# Patient Record
Sex: Male | Born: 1935
Health system: Southern US, Community
[De-identification: ages and names within clinical notes are randomized; demographics above are authoritative.]

## PROBLEM LIST (undated history)

## (undated) DIAGNOSIS — E119 Type 2 diabetes mellitus without complications: Secondary | ICD-10-CM

## (undated) DIAGNOSIS — G4733 Obstructive sleep apnea (adult) (pediatric): Secondary | ICD-10-CM

## (undated) DIAGNOSIS — R5383 Other fatigue: Secondary | ICD-10-CM

## (undated) DIAGNOSIS — R55 Syncope and collapse: Secondary | ICD-10-CM

## (undated) DIAGNOSIS — I10 Essential (primary) hypertension: Secondary | ICD-10-CM

## (undated) DIAGNOSIS — E785 Hyperlipidemia, unspecified: Secondary | ICD-10-CM

## (undated) DIAGNOSIS — I4891 Unspecified atrial fibrillation: Secondary | ICD-10-CM

## (undated) DIAGNOSIS — S5290XA Unspecified fracture of unspecified forearm, initial encounter for closed fracture: Secondary | ICD-10-CM

## (undated) DIAGNOSIS — S52209A Unspecified fracture of shaft of unspecified ulna, initial encounter for closed fracture: Secondary | ICD-10-CM

## (undated) DIAGNOSIS — I471 Supraventricular tachycardia: Secondary | ICD-10-CM

## (undated) HISTORY — DX: Unspecified fracture of shaft of unspecified ulna, initial encounter for closed fracture: S52.209A

## (undated) HISTORY — DX: Essential (primary) hypertension: I10

## (undated) HISTORY — DX: Syncope and collapse: R55

## (undated) HISTORY — DX: Unspecified fracture of unspecified forearm, initial encounter for closed fracture: S52.90XA

---

## 1993-11-09 DIAGNOSIS — S52209A Unspecified fracture of shaft of unspecified ulna, initial encounter for closed fracture: Secondary | ICD-10-CM

## 1993-11-09 HISTORY — DX: Unspecified fracture of shaft of unspecified ulna, initial encounter for closed fracture: S52.209A

## 2004-11-09 DIAGNOSIS — S5290XA Unspecified fracture of unspecified forearm, initial encounter for closed fracture: Secondary | ICD-10-CM

## 2004-11-09 HISTORY — DX: Unspecified fracture of unspecified forearm, initial encounter for closed fracture: S52.90XA

## 2009-12-04 ENCOUNTER — Ambulatory Visit: Payer: Self-pay | Admitting: Cardiology

## 2009-12-04 ENCOUNTER — Ambulatory Visit: Payer: Self-pay | Admitting: Ophthalmology

## 2009-12-18 ENCOUNTER — Ambulatory Visit: Payer: Self-pay | Admitting: Ophthalmology

## 2010-01-22 LAB — HM COLONOSCOPY: HM Colonoscopy: NORMAL

## 2010-05-21 ENCOUNTER — Ambulatory Visit: Payer: Self-pay | Admitting: Ophthalmology

## 2010-11-09 DIAGNOSIS — I471 Supraventricular tachycardia, unspecified: Secondary | ICD-10-CM

## 2010-11-09 HISTORY — DX: Supraventricular tachycardia: I47.1

## 2010-11-09 HISTORY — DX: Supraventricular tachycardia, unspecified: I47.10

## 2011-02-10 ENCOUNTER — Ambulatory Visit: Payer: Self-pay | Admitting: Internal Medicine

## 2011-06-10 HISTORY — PX: HERNIA REPAIR: SHX51

## 2011-06-29 ENCOUNTER — Ambulatory Visit: Payer: Self-pay | Admitting: General Surgery

## 2011-06-29 ENCOUNTER — Emergency Department: Payer: Self-pay | Admitting: Emergency Medicine

## 2011-06-30 ENCOUNTER — Encounter: Payer: Self-pay | Admitting: Internal Medicine

## 2011-06-30 ENCOUNTER — Telehealth: Payer: Self-pay | Admitting: Internal Medicine

## 2011-06-30 ENCOUNTER — Ambulatory Visit (INDEPENDENT_AMBULATORY_CARE_PROVIDER_SITE_OTHER): Payer: Medicare Other | Admitting: Internal Medicine

## 2011-06-30 VITALS — BP 138/68 | HR 82 | Temp 97.4°F | Resp 16 | Ht 66.5 in | Wt 141.5 lb

## 2011-06-30 DIAGNOSIS — I495 Sick sinus syndrome: Secondary | ICD-10-CM

## 2011-06-30 DIAGNOSIS — K4090 Unilateral inguinal hernia, without obstruction or gangrene, not specified as recurrent: Secondary | ICD-10-CM | POA: Insufficient documentation

## 2011-06-30 DIAGNOSIS — R000 Tachycardia, unspecified: Secondary | ICD-10-CM

## 2011-06-30 DIAGNOSIS — I499 Cardiac arrhythmia, unspecified: Secondary | ICD-10-CM

## 2011-06-30 LAB — BASIC METABOLIC PANEL
BUN: 18 mg/dL (ref 6–23)
CO2: 28 mEq/L (ref 19–32)
Chloride: 103 mEq/L (ref 96–112)
Creatinine, Ser: 0.7 mg/dL (ref 0.4–1.5)
Potassium: 4.1 mEq/L (ref 3.5–5.1)

## 2011-06-30 LAB — MAGNESIUM: Magnesium: 2.5 mg/dL (ref 1.5–2.5)

## 2011-06-30 LAB — CBC WITH DIFFERENTIAL/PLATELET
Basophils Relative: 0.7 % (ref 0.0–3.0)
Eosinophils Absolute: 0.3 10*3/uL (ref 0.0–0.7)
Eosinophils Relative: 6 % — ABNORMAL HIGH (ref 0.0–5.0)
HCT: 43 % (ref 39.0–52.0)
Lymphs Abs: 1.7 10*3/uL (ref 0.7–4.0)
MCHC: 33.8 g/dL (ref 30.0–36.0)
MCV: 93.7 fl (ref 78.0–100.0)
Monocytes Absolute: 0.4 10*3/uL (ref 0.1–1.0)
Neutrophils Relative %: 44.6 % (ref 43.0–77.0)
Platelets: 207 10*3/uL (ref 150.0–400.0)
RBC: 4.59 Mil/uL (ref 4.22–5.81)

## 2011-06-30 LAB — TSH: TSH: 0.63 u[IU]/mL (ref 0.35–5.50)

## 2011-06-30 NOTE — Progress Notes (Signed)
  Subjective:    Patient ID: Hector Lucero, male    DOB: 1928-04-26, 77 y.o.   MRN: [id number]  HPI Hector Lucero is here for medical clearance following an ER visit for tachycardia.  He presented to [facility]  on August 20 for a preop exam for anticipated inguinal hernia repair which is cheduled for Monday August 27 [facility]  He was noted to have a rapid heart rate and was sent to ER for evaluation of asymptomatic SVT vs atrial fib,  Hr 147.  During  ER visit he was given IV metoprolol and an rx for Toprol, which he has been taking,  But his low potassium was not addressed.  He feels fine, denies any history of palpitations, chest pain , dyspnea, or weakness.  No recent GI illnesses, travel or physical exertion resulting in severe dehydration.  Does drink several cups of cofee daily for caffeine intake assessment.    Review of Systems  Constitutional: Negative for fever, chills and fatigue.  HENT: Negative for rhinorrhea, drooling, neck pain and sinus pressure.   Eyes: Positive for redness. Negative for pain.  Respiratory: Negative for cough, choking, chest tightness and shortness of breath.   Cardiovascular: Negative for chest pain, palpitations and leg swelling.  Gastrointestinal: Negative for abdominal pain, constipation and blood in stool.  Genitourinary: Positive for scrotal swelling.  Musculoskeletal: Negative for back pain and arthralgias.  Skin: Positive for wound.  Neurological: Negative for dizziness, seizures, syncope and numbness.  Hematological: Negative for adenopathy. Does not bruise/bleed easily.  Psychiatric/Behavioral: Negative for suicidal ideas, hallucinations, sleep disturbance and agitation.       Objective:   Physical Exam  Constitutional: He is oriented to person, place, and time. He appears well-developed and well-nourished. No distress.  HENT:  Head: Atraumatic.  Mouth/Throat: Oropharynx is clear and moist.  Eyes: Pupils are equal, round, and reactive to light.  Right eye exhibits no discharge. Left eye exhibits no discharge. No scleral icterus.  Neck: Normal range of motion. Neck supple. No JVD present. No thyromegaly present.  Cardiovascular: Normal rate, regular rhythm, normal heart sounds and intact distal pulses.  Exam reveals no gallop and no friction rub.   No murmur heard. Pulmonary/Chest: Effort normal and breath sounds normal. He has no rales. He exhibits no tenderness.  Abdominal: Soft. Bowel sounds are normal. There is no tenderness.  Musculoskeletal: Normal range of motion. He exhibits no edema.  Lymphadenopathy:    He has no cervical adenopathy.  Neurological: He is alert and oriented to person, place, and time.  Skin: Skin is warm and dry. No erythema.  Psychiatric: He has a normal mood and affect. His behavior is normal.          Assessment & Plan:  1) Arrhythmia:  Unclear whether his episode was SVT or PAF.  His repeat EKG today was NSR, as was his panel of electrolytes, including Mg, his  CBC  and TSH.  Episode is attributed to mild dehydration and hypokalemia, now both resolved. 2) Preoperative evaluation:  Patient is medically cleared for inguinal hernia repair.

## 2011-06-30 NOTE — Patient Instructions (Signed)
Your potassium was low, which caused your heart to race.  Make sure you use electrolyte replacement solutions like Gatorade to replace the fluid you lose when you sweat a lot. Arrhythmia Categories Your heart is a muscle that works to pump blood through your body by regular contractions. The beating of your heart is controlled by a system of special pacemaker cells. These cells control the electrical activity of the heart. When the system controlling this regular beating is disturbed, a heart rhythm abnormality (arrhythmia) results. WHEN YOUR HEART SKIPS A BEAT One of the most common and least serious heart arrhythmias is called an ectopic or premature atrial heartbeat (PAC). This may be noticed as a small change in your regular pulse. A PAC originates from the top part (atrium) of the heart. Within the right atrium, the SA node is the area that normally controls the regularity of the heart. PACs occur in heart tissue outside of the SA node region. You may feel this as a skipped beat or heart flutter, especially if several occur in succession or occur frequently.   Another arrhythmia is ventricular premature complex (VCP or PVC). These extra beats start out in the bottom, more muscular chambers of the heart. In most cases a PVC is harmless. If there are underlying causes that are making the heart irritable such as an overactive thyroid or a prior heart attack PVCs may be of more concern. In a few cases, medications to control the heart rhythm may be prescribed. Things to try at home:  Cut down or avoid alcohol, tobacco and caffeine.     Get enough sleep.     Reduce stress.   Exercise more.     WHEN THE HEART BEATS TOO FAST Atrial tachycardia is a fast heart rate, which starts out in the atrium. It may last from minutes to much longer. Your heart may beat 140 to 240 times per minute instead of the normal 60 to 100.  Symptoms include a worried feeling (anxiety) and a sense that your heart is beating  fast and hard.   You may be able to stop the fast rate by holding your breath or bearing down as if you were going to have a bowel movement.   This type of fast rate is usually not dangerous.  Atrial fibrillation and atrial flutter are other fast rhythms that start in the atria. Both conditions keep the atria from filling with enough blood so the heart does not work well.  Symptoms include feeling light-headed or faint.   These fast rates may be the result of heart damage or disease. Too much thyroid hormone may play a role.   There may be no clear cause or it may be from heart disease or damage.   Medication or a special electrical treatment (cardioversion) may be needed to get the heart beating normally.  Ventricular tachycardia is a fast heart rate that starts in the lower muscular chambers (ventricles) This is a serious disorder that requires treatment as soon as possible. You need someone else to get and use a small defibrillator.  Symptoms include collapse, chest pain, or being short of breath.   Treatment may include medication, procedures to improve blood flow to the heart, or an implantable cardiac defibrillator (ICD).  DIAGNOSIS  A cardiogram (EKG or ECG) will be done to see the arrhythmia, as well as lab tests to check the underlying cause.   If the extra beats or fast rate come and go, you may wear a  Holter monitor that records your heart rate for a longer period of time.  HOME CARE INSTRUCTIONS  If your caregiver has given you a follow-up appointment, it is very important to keep that appointment. Not keeping the appointment could result in a heart attack, permanent injury, pain, and disability. If there is any problem keeping the appointment, you must call back to this facility for assistance.  SEE YOUR CAREGIVER IF YOU EXPERIENCE:  Irregular or fast heart beats (palpitations).   Skipped beats.     Lightheadedness.    Chest discomfort.   Shortness of breath.      More frequent episodes, if you are already being treated.     SEEK IMMEDIATE MEDICAL CARE IF:  You have severe chest pain, especially if the pain is crushing or pressure-like and spreads to the arms, back, neck, or jaw, or if you have sweating, feeling sick to your stomach (nausea), or shortness of breath. THIS IS AN EMERGENCY. Do not wait to see if the pain will go away. Get medical help at once. Call 911 or 0 (operator). DO NOT drive yourself to the hospital.   You feel dizzy or faint.   You have episodes of previously documented atrial tachycardia that do not resolve with the techniques your caregiver has taught you.   Irregular or rapid heartbeats begin to occur more often than in the past, especially if they are associated with more pronounced symptoms or of longer duration.  Document Released: 10/26/2005 Document Re-Released: 01/20/2010 [facility] Patient Information 2011 [place], [place].

## 2011-07-01 NOTE — Telephone Encounter (Signed)
Dr. Rutherford Nail office called and wanted to know if you cleared patient for surgery at his office visit yesterday.  Surgery is scheduled for 07/06/11 and he was going to preop today.  Please fax clearance letter to [facility] at [phone].

## 2011-07-06 ENCOUNTER — Ambulatory Visit: Payer: Self-pay | Admitting: General Surgery

## 2011-07-17 ENCOUNTER — Telehealth: Payer: Self-pay | Admitting: Internal Medicine

## 2011-07-17 NOTE — Telephone Encounter (Signed)
Patient is still having issues with rapid heart rate, please contact

## 2011-07-21 NOTE — Telephone Encounter (Signed)
I have left a message for patient to return me call

## 2011-07-23 NOTE — Telephone Encounter (Signed)
Patient has an 07/30/11 OV and is on the waiting list for an earlier appt

## 2011-07-30 ENCOUNTER — Encounter: Payer: Self-pay | Admitting: Internal Medicine

## 2011-07-30 ENCOUNTER — Ambulatory Visit (INDEPENDENT_AMBULATORY_CARE_PROVIDER_SITE_OTHER): Payer: Medicare Other | Admitting: Internal Medicine

## 2011-07-30 VITALS — BP 162/93 | HR 83 | Temp 98.1°F | Resp 16 | Wt 141.5 lb

## 2011-07-30 DIAGNOSIS — I10 Essential (primary) hypertension: Secondary | ICD-10-CM

## 2011-07-30 DIAGNOSIS — R000 Tachycardia, unspecified: Secondary | ICD-10-CM

## 2011-07-30 MED ORDER — METOPROLOL SUCCINATE ER 25 MG PO TB24: ORAL_TABLET | ORAL | Status: DC

## 2011-07-30 NOTE — Assessment & Plan Note (Addendum)
Diagnosed in mid August during preop eval.  EKg in office suggests RBBB.  He has no history of tobacco abuse or pulmonary disease, no signs of sleep apnea to suggest nocturnal hypoxia as cause.  Thyroid and electrolytes including magnesium were normal last month. He is concerned about recurrent palpitations in this setting.  Will increase the Toprol to twice daily and refer to Dossie Arbour for cardiac evaluation

## 2011-07-30 NOTE — Patient Instructions (Addendum)
We will increase the metoprolol to two times daily for your palpitations and refer your Dr. Mariah Milling at [facility] for evaluation.

## 2011-07-30 NOTE — Progress Notes (Signed)
Subjective:    Patient ID: Hector Lucero, male    DOB: 1928/03/26, 77 y.o.   MRN: [id number]  HPI 77 yo white male returns for hospital followup following his hernia surgery.  During his preoperative evaluation he was noted to be tachycardic and was sent to ER for evaluation.  Rate was in the 160's.  He was given IV metoprolol , sent home with  rx for oral metoprolol succinate , 25 mg daily and referred to me.  At last visit his rate was controlled and I cleared him for surgery after checking thyroid and electrolytes. In house EKG noted sinus rhythm with RBBB.  He remains on metoprolol.  Surgery was done, an uneventful repair of indirect right inguinal hernia, on Aug 27 by Adela Glimpse.  He returns today with chief cc of recurrent brief asymptomatic runs of tachycardia, rates around 110.  Episodes typically occur in the afternoon and evening. 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  He denies chest pain, dyspnea, orthopnea, weight loss or gain, and edema.  Drinks 4 copies of weak coffe daily,  No soft drinks, rare alcohol .  No tobacco.    Past Medical History  Diagnosis Date  . Radial fracture 2006    right, screws and plates  . Ulnar fracture 1995    fall off of ladder   No medication comments found. Current Outpatient Prescriptions on File Prior to Visit  Medication Sig Dispense Refill  . aspirin 81 MG tablet Take 81 mg by mouth daily.        . calcium carbonate (OS-CAL) 600 MG TABS Take 600 mg by mouth daily.        . fish oil-omega-3 fatty acids 1000 MG capsule Take 1,000 mg by mouth daily.        . Multiple Vitamins-Minerals (CENTRUM SILVER PO) Take 1 tablet by mouth daily.           Review of Systems  Constitutional: Negative for fever, chills, diaphoresis, activity change, appetite change, fatigue and unexpected weight change.  HENT: Negative for hearing loss, ear pain, nosebleeds, congestion,  sore throat, facial swelling, rhinorrhea, sneezing, drooling, mouth sores, trouble swallowing, neck pain, neck stiffness, dental problem, voice change, postnasal drip, sinus pressure, tinnitus and ear discharge.   Eyes: Negative for photophobia, pain, discharge, redness, itching and visual disturbance.  Respiratory: Negative for apnea, cough, choking, chest tightness, shortness of breath, wheezing and stridor.   Cardiovascular: Positive for palpitations. Negative for chest pain and leg swelling.  Gastrointestinal: Negative for nausea, vomiting, abdominal pain, diarrhea, constipation, blood in stool, abdominal distention, anal bleeding and rectal pain.  Genitourinary: Negative for dysuria, urgency, frequency, hematuria, flank pain, decreased urine volume, scrotal swelling, difficulty urinating and testicular pain.  Musculoskeletal: Negative for myalgias, back pain, joint swelling, arthralgias and gait problem.  Skin: Negative for color change, rash and wound.  Neurological: Negative for dizziness, tremors, seizures, syncope, speech difficulty, weakness, light-headedness, numbness and headaches.  Psychiatric/Behavioral: Negative for suicidal ideas, hallucinations, behavioral problems, confusion, sleep disturbance, dysphoric mood, decreased concentration and agitation. The patient is not nervous/anxious.    BP 162/93  Pulse 83  Temp(Src) 98.1 F (36.7 C) (Oral)  Resp 16  Wt 141 lb 8 oz (64.184 kg)  SpO2 98%     Objective:   Physical Exam  Constitutional: He is oriented to person, place, and time.  HENT:  Head: Normocephalic and atraumatic.  Mouth/Throat: Oropharynx  is clear and moist.  Eyes: Conjunctivae and EOM are normal.  Neck: Normal range of motion. Neck supple. No JVD present. No thyromegaly present.  Cardiovascular: Normal rate, regular rhythm and normal heart sounds.   Pulmonary/Chest: Effort normal and breath sounds normal. He has no wheezes. He has no rales.  Abdominal: Soft.  Bowel sounds are normal. He exhibits no mass. There is no tenderness. There is no rebound.  Musculoskeletal: Normal range of motion. He exhibits no edema.  Neurological: He is alert and oriented to person, place, and time.  Skin: Skin is warm and dry.  Psychiatric: He has a normal mood and affect.          Assessment & Plan:

## 2011-08-13 ENCOUNTER — Ambulatory Visit (INDEPENDENT_AMBULATORY_CARE_PROVIDER_SITE_OTHER): Payer: Medicare Other | Admitting: Cardiovascular Disease

## 2011-08-13 ENCOUNTER — Encounter: Payer: Self-pay | Admitting: Cardiovascular Disease

## 2011-08-13 VITALS — BP 110/62 | HR 77 | Ht 67.0 in | Wt 140.0 lb

## 2011-08-13 DIAGNOSIS — R000 Tachycardia, unspecified: Secondary | ICD-10-CM

## 2011-08-13 MED ORDER — DILTIAZEM HCL 30 MG PO TABS: 30.0000 mg | ORAL_TABLET | Freq: Three times a day (TID) | ORAL | Status: DC | PRN

## 2011-08-13 NOTE — Progress Notes (Signed)
Patient ID: Hector Lucero, male    DOB: [birth year], 77 y.o.   MRN: [id number]  HPI Comments: 77 yo white male, patient of Dr. Darrick Huntsman,  with a hx of  Hernia, s/p surgical repair,  noted to be tachycardic during his preoperative evaluation,   sent to ER, noted to have a narrow complex tachycardia with rates in the 160's, given IV metoprolol with acute conversion of his rate to the 70s , sent home with  metoprolol succinate 25 mg daily. The metoprolol was increased to b.i.d. He recently by Dr. Darrick Huntsman.    He reports that he had an uneventful repair of indirect right inguinal hernia, on Aug 27 by Adela Glimpse.   He did not appreciate the tachycardia appeared he had no shortness of breath, no chest discomfort. He was unaware that his heart rate was elevated. He denies any significant symptoms at home. He is relatively active with no complaints. He does drink significant coffee on a daily basis but has been trying to cut back.  Recent note by Dr. Darrick Huntsman mentioned his heart rate continued to be elevated with rates to 110. His metoprolol succinate was increased to b.i.d..    No tobacco.   EKG today shows normal sinus rhythm with rate 77 beats per minute with no significant ST or T wave changes   Outpatient Encounter Prescriptions as of 08/13/2011  Medication Sig Dispense Refill  . aspirin 81 MG tablet Take 81 mg by mouth daily.        . calcium carbonate (OS-CAL) 600 MG TABS Take 600 mg by mouth daily.        . fish oil-omega-3 fatty acids 1000 MG capsule Take 1,000 mg by mouth daily.        . metoprolol succinate (TOPROL-XL) 25 MG 24 hr tablet Take one tablet two times daily for tachycardia.  60 tablet  5  . Multiple Vitamins-Minerals (CENTRUM SILVER PO) Take 1 tablet by mouth daily.         Review of Systems  Constitutional: Negative.   HENT: Negative.   Eyes: Negative.   Respiratory: Negative.   Cardiovascular: Negative.   Gastrointestinal: Negative.   Musculoskeletal: Negative.   Skin:  Negative.   Neurological: Negative.   Hematological: Negative.   Psychiatric/Behavioral: Negative.   All other systems reviewed and are negative.    BP 110/62  Pulse 77  Ht 5\' 7"  (1.702 m)  Wt 140 lb (63.504 kg)  BMI 21.93 kg/m2  Physical Exam  Nursing note and vitals reviewed. Constitutional: He is oriented to person, place, and time. He appears well-developed and well-nourished.  HENT:  Head: Normocephalic.  Nose: Nose normal.  Mouth/Throat: Oropharynx is clear and moist.  Eyes: Conjunctivae are normal. Pupils are equal, round, and reactive to light.  Neck: Normal range of motion. Neck supple. No JVD present.  Cardiovascular: Normal rate, regular rhythm, S1 normal, S2 normal, normal heart sounds and intact distal pulses.  Exam reveals no gallop and no friction rub.   No murmur heard. Pulmonary/Chest: Effort normal and breath sounds normal. No respiratory distress. He has no wheezes. He has no rales. He exhibits no tenderness.  Abdominal: Soft. Bowel sounds are normal. He exhibits no distension. There is no tenderness.  Musculoskeletal: Normal range of motion. He exhibits no edema and no tenderness.  Lymphadenopathy:    He has no cervical adenopathy.  Neurological: He is alert and oriented to person, place, and time. Coordination normal.  Skin: Skin is warm and dry.  No rash noted. No erythema.  Psychiatric: He has a normal mood and affect. His behavior is normal. Judgment and thought content normal.           Assessment and Plan

## 2011-08-13 NOTE — Patient Instructions (Signed)
You are doing well. Please take your pulse using a monitor Take the diltiazem as needed for tachycardia Please call us if you have new issues that need to be addressed before your next appt.  We will call you for a follow up Appt. In 1     months

## 2011-08-13 NOTE — Assessment & Plan Note (Addendum)
We were able to obtain his EKGs from the hospital. This showed a heart rate of 147 beats per minute, regular narrow complex tachycardia. Rate improved to 139 beats per minute shortly after, again with narrow complex regular rhythm. Unable to distinguish whether this is an atrial tachycardia or SVT. It did seem to respond to IV metoprolol in the emergency room on August 20.  He seems to be tolerating metoprolol succinate 25 mg b.i.d. Very well with no complaints. As he did not appreciate the tachycardia on August 20, it is uncertain if he has had this arrhythmia off and on for prolonged period of time or if this was a single event.  We have suggested he wear a Holter monitor for 48 hours to exclude arrhythmia. I've asked him to consider buying a pulse oximeter through [employer] or a medical supply store so that he can easily track his heart rate at home. This could potentially save [place] from having him wear an event monitor for 30 days.  Clinical exam is essentially benign and no further testing has been ordered. If he does have further symptoms such as shortness of breath or chest discomfort, we would consider an echo and stress test. He seemed to handle a heart rate of 160 beats per minute with ease suggesting there is no rush to perform additional testing at this time.  I did provide him with a prescription for diltiazem 30 mg to take p.r.n. If he does notice a tachycardia at home. I have shown him the Valsalva and carotid sinus maneuvers. I suggested to him that if he notices a tachycardia at home that is number with extra diltiazem or maneuvers, that he go to the emergency room. If this happens, he may require an ablation.

## 2011-08-20 ENCOUNTER — Encounter: Payer: Medicare Other | Admitting: *Deleted

## 2011-08-20 DIAGNOSIS — R000 Tachycardia, unspecified: Secondary | ICD-10-CM

## 2011-09-02 ENCOUNTER — Telehealth: Payer: Self-pay | Admitting: Cardiovascular Disease

## 2011-09-02 NOTE — Telephone Encounter (Signed)
Pt calling about holter monitor results. Also needs a script mailed to pt for diltiazem 30mg . Needs it for mail in pharamacy

## 2011-09-02 NOTE — Telephone Encounter (Signed)
Will discuss results after TG reviewed with his last note.

## 2011-09-04 ENCOUNTER — Telehealth: Payer: Self-pay

## 2011-09-04 MED ORDER — DILTIAZEM HCL 30 MG PO TABS: 30.0000 mg | ORAL_TABLET | Freq: Three times a day (TID) | ORAL | Status: DC | PRN

## 2011-09-04 NOTE — Telephone Encounter (Signed)
The patient would like to pick up Rx for diltiazem 30 mg for 90 day supply.

## 2011-09-11 ENCOUNTER — Telehealth: Payer: Self-pay

## 2011-09-11 ENCOUNTER — Other Ambulatory Visit: Payer: Self-pay | Admitting: *Deleted

## 2011-09-11 MED ORDER — DILTIAZEM HCL 30 MG PO TABS: 30.0000 mg | ORAL_TABLET | Freq: Three times a day (TID) | ORAL | Status: DC | PRN

## 2011-09-11 NOTE — Telephone Encounter (Signed)
Refill sent.

## 2011-09-14 ENCOUNTER — Ambulatory Visit (INDEPENDENT_AMBULATORY_CARE_PROVIDER_SITE_OTHER): Payer: Medicare Other | Admitting: Cardiovascular Disease

## 2011-09-14 ENCOUNTER — Encounter: Payer: Self-pay | Admitting: Cardiovascular Disease

## 2011-09-14 VITALS — BP 120/62 | HR 72 | Ht 68.0 in | Wt 140.0 lb

## 2011-09-14 DIAGNOSIS — R000 Tachycardia, unspecified: Secondary | ICD-10-CM

## 2011-09-14 NOTE — Assessment & Plan Note (Signed)
He denies any further symptoms. We have asked him to monitor his heart rate periodically. As he is doing well with good energy and no complaints, we have suggested he stay on metoprolol succinate and diltiazem t.i.d..

## 2011-09-14 NOTE — Patient Instructions (Signed)
You are doing well. No medication changes were made.  Please call us if you have new issues that need to be addressed before your next appt.  The office will contact you for a follow up Appt. In 12 months  

## 2011-09-14 NOTE — Progress Notes (Signed)
Patient ID: Hector Lucero, male    DOB: 05-23-1928, 77 y.o.   MRN: [id number]  HPI Comments: 77 yo white male, patient of Dr. Darrick Huntsman,  with a hx of  Hernia, s/p surgical repair,  noted to be tachycardic during his preoperative evaluation,   sent to ER, noted to have a narrow complex tachycardia with rates in the 160's, given IV metoprolol with acute conversion of his rate to the 70s , sent home with  metoprolol succinate 25 mg daily, We have increased to metoprolol b.i.d. By Dr. Darrick Huntsman. He reports that he had an uneventful repair of indirect right inguinal hernia, on Aug 27 by Adela Glimpse.   Holter monitor x48 hours at that time showed 27 episodes of right true SVT or atrial tachycardia, the longest episode lasting 36 beats. He was asymptomatic He denies  shortness of breath, no chest discomfort. He was unaware that his heart rate was elevated. He denies any significant symptoms at home. He is relatively active with no complaints. He does drink significant coffee on a daily basis but has been trying to cut back.  Today he reports that he only takes metoprolol succinate once a day and has been taking diltiazem 30 mg t.i.d.. We had previously prescribed the diltiazem p.r.n. For breakthrough palpitations. He is uncertain when the medication changes were made. Overall he feels well with good energy.  EKG today shows normal sinus rhythm with rate 72 beats per minute with no significant ST or T wave changes  Outpatient Encounter Prescriptions as of 09/14/2011  Medication Sig Dispense Refill  . aspirin 81 MG tablet Take 81 mg by mouth daily.        . calcium carbonate (OS-CAL) 600 MG TABS Take 600 mg by mouth daily.        Marland Kitchen diltiazem (CARDIZEM) 30 MG tablet Take 30 mg by mouth 3 (three) times daily.        . fish oil-omega-3 fatty acids 1000 MG capsule Take 1,000 mg by mouth daily.        . metoprolol succinate (TOPROL-XL) 25 MG 24 hr tablet Take 25 mg by mouth daily.        . Multiple Vitamins-Minerals  (CENTRUM SILVER PO) Take 1 tablet by mouth daily.          Review of Systems  Constitutional: Negative.   HENT: Negative.   Eyes: Negative.   Respiratory: Negative.   Cardiovascular: Negative.   Gastrointestinal: Negative.   Musculoskeletal: Negative.   Skin: Negative.   Neurological: Negative.   Hematological: Negative.   Psychiatric/Behavioral: Negative.   All other systems reviewed and are negative.    BP 120/62  Pulse 72  Ht 5\' 8"  (1.727 m)  Wt 140 lb (63.504 kg)  BMI 21.29 kg/m2  Physical Exam  Nursing note and vitals reviewed. Constitutional: He is oriented to person, place, and time. He appears well-developed and well-nourished.  HENT:  Head: Normocephalic.  Nose: Nose normal.  Mouth/Throat: Oropharynx is clear and moist.  Eyes: Conjunctivae are normal. Pupils are equal, round, and reactive to light.  Neck: Normal range of motion. Neck supple. No JVD present.  Cardiovascular: Normal rate, regular rhythm, S1 normal, S2 normal, normal heart sounds and intact distal pulses.  Exam reveals no gallop and no friction rub.   No murmur heard. Pulmonary/Chest: Effort normal and breath sounds normal. No respiratory distress. He has no wheezes. He has no rales. He exhibits no tenderness.  Abdominal: Soft. Bowel sounds are normal. He  exhibits no distension. There is no tenderness.  Musculoskeletal: Normal range of motion. He exhibits no edema and no tenderness.  Lymphadenopathy:    He has no cervical adenopathy.  Neurological: He is alert and oriented to person, place, and time. Coordination normal.  Skin: Skin is warm and dry. No rash noted. No erythema.  Psychiatric: He has a normal mood and affect. His behavior is normal. Judgment and thought content normal.           Assessment and Plan

## 2011-10-07 ENCOUNTER — Telehealth: Payer: Self-pay | Admitting: Internal Medicine

## 2011-10-07 ENCOUNTER — Other Ambulatory Visit: Payer: Self-pay | Admitting: Internal Medicine

## 2011-10-07 MED ORDER — METOPROLOL SUCCINATE ER 25 MG PO TB24: 25.0000 mg | ORAL_TABLET | Freq: Two times a day (BID) | ORAL | Status: DC

## 2011-10-07 MED ORDER — METOPROLOL SUCCINATE ER 25 MG PO TB24: 25.0000 mg | ORAL_TABLET | Freq: Every day | ORAL | Status: DC

## 2011-10-07 NOTE — Telephone Encounter (Signed)
Please note that dose was changed to once daily on the metoprolol,  Qty #90 after reviewing last note from Dr. Mariah Milling

## 2011-12-14 ENCOUNTER — Telehealth: Payer: Self-pay | Admitting: Internal Medicine

## 2011-12-14 DIAGNOSIS — E785 Hyperlipidemia, unspecified: Secondary | ICD-10-CM

## 2011-12-14 DIAGNOSIS — R739 Hyperglycemia, unspecified: Secondary | ICD-10-CM

## 2011-12-14 NOTE — Telephone Encounter (Signed)
[phone] Pt walked in.  He had eye dr  With dr apsior at [facility].  The doc there wanted to know when his last standing sugar labs were done and if you could do this

## 2011-12-14 NOTE — Telephone Encounter (Addendum)
Last one was doe in August and normal. You can have him return for a fasting basic metabolic panel and a hemoglobin A1c. he also needs fasting lipids. I will place the order.

## 2011-12-15 NOTE — Telephone Encounter (Signed)
Patient notified.  Lab appt scheduled.

## 2011-12-17 ENCOUNTER — Other Ambulatory Visit (INDEPENDENT_AMBULATORY_CARE_PROVIDER_SITE_OTHER): Payer: Medicare Other | Admitting: *Deleted

## 2011-12-17 DIAGNOSIS — E785 Hyperlipidemia, unspecified: Secondary | ICD-10-CM

## 2011-12-17 DIAGNOSIS — R7309 Other abnormal glucose: Secondary | ICD-10-CM

## 2011-12-17 DIAGNOSIS — R739 Hyperglycemia, unspecified: Secondary | ICD-10-CM

## 2011-12-17 LAB — COMPREHENSIVE METABOLIC PANEL
ALT: 16 U/L (ref 0–53)
AST: 23 U/L (ref 0–37)
Alkaline Phosphatase: 66 U/L (ref 39–117)
CO2: 28 mEq/L (ref 19–32)
Creatinine, Ser: 0.7 mg/dL (ref 0.4–1.5)
GFR: 116.69 mL/min (ref >60.00)
Sodium: 139 mEq/L (ref 135–145)
Total Bilirubin: 1.1 mg/dL (ref 0.3–1.2)

## 2011-12-17 LAB — LIPID PANEL
HDL: 66.7 mg/dL (ref >39.00)
Triglycerides: 131 mg/dL (ref 0.0–149.0)
VLDL: 26.2 mg/dL (ref 0.0–40.0)

## 2011-12-17 LAB — LDL CHOLESTEROL, DIRECT: Direct LDL: 129.2 mg/dL

## 2011-12-22 ENCOUNTER — Encounter: Payer: Self-pay | Admitting: Internal Medicine

## 2011-12-22 ENCOUNTER — Ambulatory Visit (INDEPENDENT_AMBULATORY_CARE_PROVIDER_SITE_OTHER): Payer: Medicare Other | Admitting: Internal Medicine

## 2011-12-22 DIAGNOSIS — E119 Type 2 diabetes mellitus without complications: Secondary | ICD-10-CM | POA: Diagnosis not present

## 2011-12-22 DIAGNOSIS — R4182 Altered mental status, unspecified: Secondary | ICD-10-CM

## 2011-12-22 DIAGNOSIS — R000 Tachycardia, unspecified: Secondary | ICD-10-CM

## 2011-12-22 DIAGNOSIS — E785 Hyperlipidemia, unspecified: Secondary | ICD-10-CM

## 2011-12-22 NOTE — Progress Notes (Signed)
Subjective:    Patient ID: Hector Lucero, male    DOB: 09-Dec-1927, 78 y.o.   MRN: [id number]  HPI  Hector Lucero is an 78 year old white male with a history of supraventricular tachycardia found incidentally during a preop evaluation for an inguinal hernia in August of 2012 presents today for a scheduled Medicare exam but has agreed to postpone this to 2 current complaint of altered mental status. He is accompanied by his wife who provides a more lengthy history of his recent episodes. According to his wife Hector Lucero had a syncopal episode in November,  which occurred at home during a house party .  He had not eaten in 5 hours,  And had one or two cocktails. He was noted to be ataxic in gait and.the July down. A neighbor noted his behavior and called EMT. His heart rate was monitored and normal per patient he refused transport to the ER. It is not clear whether a capillary blood glucose was checked.the second incident happened several weeks later  during Christmas holidays. After having 2  whiskey drinks he had a fall at his sister-in law's.  there was no loss of consciousness reported  since then his wife has noted on several occasions that he seems a bit slowed in his reactions and mentation without slurred speech these episodes occur around 5 PM. He denies a history of regular alcohol use. 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   his wife thinks he has made a few more errors than usual lately in terms of getting addresses wrong and making calculations.  Past Medical History  Diagnosis Date  . Radial fracture 2006    right, screws and plates  . Ulnar fracture 1995    fall off of ladder  . Hypertension    Current Outpatient Prescriptions on File Prior to Visit  Medication Sig Dispense Refill  . aspirin 81 MG tablet Take 81 mg by mouth daily.        . calcium carbonate (OS-CAL) 600 MG TABS Take 600  mg by mouth daily.        Marland Kitchen diltiazem (CARDIZEM) 30 MG tablet Take 30 mg by mouth 3 (three) times daily.        . fish oil-omega-3 fatty acids 1000 MG capsule Take 1,000 mg by mouth daily.        . metoprolol succinate (TOPROL-XL) 25 MG 24 hr tablet Take 1 tablet (25 mg total) by mouth daily.  90 tablet  3  . Multiple Vitamins-Minerals (CENTRUM SILVER PO) Take 1 tablet by mouth daily.          Review of Systems  Constitutional: Negative for fever, chills, diaphoresis, activity change, appetite change, fatigue and unexpected weight change.  HENT: Negative for hearing loss, ear pain, nosebleeds, congestion, sore throat, facial swelling, rhinorrhea, sneezing, drooling, mouth sores, trouble swallowing, neck pain, neck stiffness, dental problem, voice change, postnasal drip, sinus pressure, tinnitus and ear discharge.   Eyes: Negative for photophobia, pain, discharge, redness, itching and visual disturbance.  Respiratory: Negative for apnea, cough, choking, chest tightness, shortness of breath, wheezing and stridor.   Cardiovascular: Negative for chest pain, palpitations and leg swelling.  Gastrointestinal: Negative for nausea, vomiting, abdominal pain, diarrhea, constipation, blood in stool, abdominal distention, anal bleeding and rectal pain.  Genitourinary: Negative for dysuria, urgency, frequency, hematuria, flank pain, decreased urine volume, scrotal swelling, difficulty urinating and  testicular pain.  Musculoskeletal: Negative for myalgias, back pain, joint swelling, arthralgias and gait problem.  Skin: Negative for color change, rash and wound.  Neurological: Positive for light-headedness. Negative for dizziness, tremors, seizures, syncope, speech difficulty, weakness, numbness and headaches.  Psychiatric/Behavioral: Positive for decreased concentration. Negative for suicidal ideas, hallucinations, behavioral problems, confusion, sleep disturbance, dysphoric mood and agitation. The patient is not  nervous/anxious.        Objective:   Physical Exam  Constitutional: He is oriented to person, place, and time.  HENT:  Head: Normocephalic and atraumatic.  Mouth/Throat: Oropharynx is clear and moist.  Eyes: Conjunctivae and EOM are normal.  Neck: Normal range of motion. Neck supple. No JVD present. No thyromegaly present.  Cardiovascular: Normal rate, regular rhythm and normal heart sounds.   Pulmonary/Chest: Effort normal and breath sounds normal. He has no wheezes. He has no rales.  Abdominal: Soft. Bowel sounds are normal. He exhibits no mass. There is no tenderness. There is no rebound.  Musculoskeletal: Normal range of motion. He exhibits no edema.  Neurological: He is alert and oriented to person, place, and time. He has normal reflexes. He displays normal reflexes. No cranial nerve deficit. Coordination normal.       MMSE score 29/30 Naming exercises: 21 animals, 11 works starting with F   Skin: Skin is warm and dry.  Psychiatric: He has a normal mood and affect. His behavior is normal. Judgment and thought content normal.          Assessment & Plan:

## 2011-12-22 NOTE — Patient Instructions (Addendum)
Your episodes of altered mental status may be occurring because your blood sugars may be dropping on you.  Please be sure to combine protein with carbohydrates every tine you eat, and eat every 3 hours while awake to avoid the fasting state.  Try varieties of the Atkins bars: they are available at [employer] and most grocery stores.  Nuts , cheese,  And egg whites are a good source of protein without too much cholesterol  Return in one month for your physical

## 2011-12-24 ENCOUNTER — Encounter: Payer: Self-pay | Admitting: Internal Medicine

## 2011-12-24 DIAGNOSIS — E119 Type 2 diabetes mellitus without complications: Secondary | ICD-10-CM | POA: Insufficient documentation

## 2011-12-24 DIAGNOSIS — R4182 Altered mental status, unspecified: Secondary | ICD-10-CM | POA: Insufficient documentation

## 2011-12-24 DIAGNOSIS — E785 Hyperlipidemia, unspecified: Secondary | ICD-10-CM | POA: Insufficient documentation

## 2011-12-24 NOTE — Assessment & Plan Note (Addendum)
He has had no history of a fasting glucose of greater than 125 but had retinopathy on eye exam and was referred here for diabetes testing. His hemoglobin A1c is 6.3 indicating diabetes.  I suspect that his recent episode of altered mental status redo to hypoglycemic events caused by fasting and/or alcohol.    I spent 20 minutes discussing diet and the role of protein in managing blood sugars after eating her carbohydrate laden meals. Recommend combining all starches with proteins and eating every 3 hours using protein bars and protein shakes for ease and nutritional benefit.  Other issues remain to be active on our whether to treat his hyperlipidemia given new onset diabetes. His LDL is 129 he is taking an aspirin daily he takes diltiazem and metoprolol for his SVT will likely not have room to add an ACE inhibitor without lowering his blood pressure too much.

## 2011-12-24 NOTE — Assessment & Plan Note (Signed)
He is now considered hyperlipidemic due to his new onset diabetes. With an LDL of 129 I am hesitant to start this 78 year old man on statins unless we began a workup for cerebrovascular disease and find carotid plaques or evidence of chronic small vessel ischemia on future MRI.Hector Lucero

## 2011-12-24 NOTE — Assessment & Plan Note (Signed)
Managed with low-dose Toprol and short acting diltiazem

## 2011-12-24 NOTE — Assessment & Plan Note (Signed)
His neurologic exam including his Mini-Mental Status exam are excellent. I suspect that his episodes of altered mental status may be due to hypoglycemic events caused by prolonged fasting and/or use of alcohol. I will see him back in one month after making dietary changes and avoiding alcohol. If he continues to have episodes he will need a workup for cerebrovascular disease including carotid Dopplers and MRI of brain.

## 2011-12-25 DIAGNOSIS — H353290 Exudative age-related macular degeneration, unspecified eye, stage unspecified: Secondary | ICD-10-CM | POA: Diagnosis not present

## 2012-01-20 ENCOUNTER — Encounter: Payer: Self-pay | Admitting: Internal Medicine

## 2012-01-20 ENCOUNTER — Ambulatory Visit (INDEPENDENT_AMBULATORY_CARE_PROVIDER_SITE_OTHER): Payer: Medicare Other | Admitting: Internal Medicine

## 2012-01-20 VITALS — BP 120/64 | HR 78 | Temp 97.9°F | Resp 14 | Ht 67.0 in | Wt 145.8 lb

## 2012-01-20 DIAGNOSIS — R000 Tachycardia, unspecified: Secondary | ICD-10-CM | POA: Diagnosis not present

## 2012-01-20 DIAGNOSIS — R4182 Altered mental status, unspecified: Secondary | ICD-10-CM

## 2012-01-20 DIAGNOSIS — E785 Hyperlipidemia, unspecified: Secondary | ICD-10-CM | POA: Diagnosis not present

## 2012-01-20 DIAGNOSIS — Z0000 Encounter for general adult medical examination without abnormal findings: Secondary | ICD-10-CM | POA: Diagnosis not present

## 2012-01-20 DIAGNOSIS — Z23 Encounter for immunization: Secondary | ICD-10-CM | POA: Diagnosis not present

## 2012-01-20 DIAGNOSIS — E119 Type 2 diabetes mellitus without complications: Secondary | ICD-10-CM | POA: Diagnosis not present

## 2012-01-20 NOTE — Assessment & Plan Note (Signed)
No prior statin trial.  Discussed goal of LDl , 70 now that he has been diagnosed with Type 2 DM. ,  Will repeat LDL in June and initiate statin theapy if considerbaly above goal.

## 2012-01-20 NOTE — Patient Instructions (Signed)
You can go to the [facility] Department to get your DtaP vaccine at no charge .   Their address is [address]  And their phone number is [phone]  You were given the Pneumovax vaccine today  You will need to return in June for fasting labs, and Hgba1c and a visit since you now have Diet Controlled diabetes.

## 2012-01-20 NOTE — Progress Notes (Signed)
Patient ID: Hector Lucero, male   DOB: [birth year], 78 y.o.   MRN: [id number]   The patient is here for annual Medicare wellness examination and management of other chronic and acute problems.   The risk factors are reflected in the social history.  The roster of all physicians providing medical care to patient - is listed in the Snapshot section of the chart.  Activities of daily living:  The patient is 100% independent in all ADLs: dressing, toileting, feeding as well as independent mobility  Home safety : The patient has smoke detectors in the home. They wear seatbelts.  There are no firearms at home. There is no violence in the home.   There is no risks for hepatitis, STDs or HIV. There is no   history of blood transfusion. They have no travel history to infectious disease endemic areas of the world.  The patient has seen their dentist in the last six month. They have seen their eye doctor in the last year. They report no hearing difficulty with regard to whispered voices and television programs.  They have deferred audiologic testing in the last year.  They do not  have excessive sun exposure. Discussed the need for sun protection: hats, long sleeves and use of sunscreen if there is significant sun exposure.   Diet: the importance of a healthy diet is discussed. They do have a healthy diet.  The benefits of regular aerobic exercise were discussed. He exercises 5 times per week ,  60 minutes.   Depression screen: there are no signs or vegative symptoms of depression- irritability, change in appetite, anhedonia, sadness/tearfullness.  Cognitive assessment: the patient manages all their financial and personal affairs and is actively engaged. They could relate day,date,year and events; recalled 2/3 objects at 3 minutes; performed clock-face test normally.  The following portions of the patient's history were reviewed and updated as appropriate: allergies, current medications, past family  history, past medical history,  past surgical history, past social history  and problem list.  Visual acuity was not assessed per patient preference since he has regular follow up with her ophthalmologist. Hearing and body mass index were assessed and reviewed.   During the course of the visit the patient was educated and counseled about appropriate screening and preventive services including : fall prevention , diabetes screening, nutrition counseling, colorectal cancer screening, and recommended immunizations.    Assess and Plan   Altered mental status Episodes resolved with management of new diagnosis of diabetes.    Hyperlipidemia LDL goal < 70 No prior statin trial.  Discussed goal of LDl , 70 now that he has been diagnosed with Type 2 DM. ,  Will repeat LDL in June and initiate statin theapy if considerbaly above goal.  Tachycardia Managed with metoprolol.   Diabetes mellitus type 2, diet-controlled Diagnosed last month after having several episodes of altered mental status occurring during periods of fasting.  He does not need medications.  More aggressive control of LDL was discussed;  Urine microalb / cr ratio ordered.Marland Kitchen     Updated Medication List Outpatient Encounter Prescriptions as of 01/20/2012  Medication Sig Dispense Refill  . aspirin 81 MG tablet Take 81 mg by mouth daily.        . calcium carbonate (OS-CAL) 600 MG TABS Take 600 mg by mouth daily.        Marland Kitchen diltiazem (CARDIZEM) 30 MG tablet Take 30 mg by mouth 3 (three) times daily.        Marland Kitchen  fish oil-omega-3 fatty acids 1000 MG capsule Take 1,000 mg by mouth daily.        . metoprolol succinate (TOPROL-XL) 25 MG 24 hr tablet Take 1 tablet (25 mg total) by mouth daily.  90 tablet  3  . Multiple Vitamins-Minerals (CENTRUM SILVER PO) Take 1 tablet by mouth daily.

## 2012-01-20 NOTE — Assessment & Plan Note (Signed)
Episodes resolved with management of new diagnosis of diabetes.

## 2012-01-20 NOTE — Assessment & Plan Note (Signed)
Managed with metoprolol 

## 2012-01-20 NOTE — Assessment & Plan Note (Signed)
Diagnosed last month after having several episodes of altered mental status occurring during periods of fasting.  He does not need medications.  More aggressive control of LDL was discussed;  Urine microalb / cr ratio ordered.Marland Kitchen

## 2012-03-04 DIAGNOSIS — H353290 Exudative age-related macular degeneration, unspecified eye, stage unspecified: Secondary | ICD-10-CM | POA: Diagnosis not present

## 2012-03-17 DIAGNOSIS — C61 Malignant neoplasm of prostate: Secondary | ICD-10-CM | POA: Diagnosis not present

## 2012-04-18 ENCOUNTER — Other Ambulatory Visit (INDEPENDENT_AMBULATORY_CARE_PROVIDER_SITE_OTHER): Payer: Medicare Other | Admitting: *Deleted

## 2012-04-18 DIAGNOSIS — E119 Type 2 diabetes mellitus without complications: Secondary | ICD-10-CM | POA: Diagnosis not present

## 2012-04-18 DIAGNOSIS — E785 Hyperlipidemia, unspecified: Secondary | ICD-10-CM

## 2012-04-18 LAB — LIPID PANEL: VLDL: 22.2 mg/dL (ref 0.0–40.0)

## 2012-04-18 LAB — MICROALBUMIN / CREATININE URINE RATIO
Creatinine,U: 77.1 mg/dL
Microalb, Ur: 0.3 mg/dL (ref 0.0–1.9)

## 2012-04-19 LAB — COMPLETE METABOLIC PANEL WITH GFR
AST: 19 U/L (ref 0–37)
BUN: 15 mg/dL (ref 6–23)
Calcium: 9.2 mg/dL (ref 8.4–10.5)
Chloride: 105 mEq/L (ref 96–112)
Creat: 0.77 mg/dL (ref 0.50–1.35)
GFR, Est African American: >89 mL/min
GFR, Est Non African American: 85 mL/min

## 2012-05-04 ENCOUNTER — Ambulatory Visit (INDEPENDENT_AMBULATORY_CARE_PROVIDER_SITE_OTHER): Payer: Medicare Other | Admitting: Cardiovascular Disease

## 2012-05-04 ENCOUNTER — Encounter: Payer: Self-pay | Admitting: Cardiovascular Disease

## 2012-05-04 VITALS — BP 120/72 | HR 77 | Ht 67.0 in | Wt 142.3 lb

## 2012-05-04 DIAGNOSIS — R000 Tachycardia, unspecified: Secondary | ICD-10-CM | POA: Diagnosis not present

## 2012-05-04 DIAGNOSIS — E119 Type 2 diabetes mellitus without complications: Secondary | ICD-10-CM

## 2012-05-04 DIAGNOSIS — R5381 Other malaise: Secondary | ICD-10-CM | POA: Diagnosis not present

## 2012-05-04 DIAGNOSIS — R0602 Shortness of breath: Secondary | ICD-10-CM | POA: Diagnosis not present

## 2012-05-04 DIAGNOSIS — E785 Hyperlipidemia, unspecified: Secondary | ICD-10-CM

## 2012-05-04 DIAGNOSIS — R5383 Other fatigue: Secondary | ICD-10-CM | POA: Diagnosis not present

## 2012-05-04 NOTE — Assessment & Plan Note (Addendum)
Symptoms of tachycardia seemed to have resolved recently after his recent busy activity at [employer] [place]. He is less concerned about his rhythms recently as he's been less symptomatic. I have asked him to call if symptoms get worse again.

## 2012-05-04 NOTE — Assessment & Plan Note (Addendum)
He has vague complaints of fatigue, malaise. Wife is concerned about memory problems late at night. Uncertain if he over did it recently with his "[employer]" at [facility] [place]. By his account, it exhausted him. Uncertain if he was mildly dehydrated. Blood pressure is borderline low today. We have suggested he increase his salt and fluid intake, possibly consider checking his blood pressure with the nurse at [facility]. If blood pressure is running low, he could hold the diltiazem in the middle of the day. Weight has dropped and this could explain lower blood pressure.  If he continues to have symptoms, additional workup could be performed such as stress testing, echocardiogram, etc.. we've asked him to call our office if symptoms do persist. He is tolerating aggressive exercise in the pool.  I did suggest if symptoms persist, he could followup with Dr. Darrick Huntsman for a full lab panel. Uncertain if testosterone, thyroid or other lab was checked.

## 2012-05-04 NOTE — Assessment & Plan Note (Signed)
Cholesterol is slightly better than average. We have recommended red yeast rice. He is reluctant to take any medications, possibly at the urging of his wife.

## 2012-05-04 NOTE — Assessment & Plan Note (Signed)
We have encouraged continued exercise, careful diet management in an effort to lose weight. 

## 2012-05-04 NOTE — Patient Instructions (Addendum)
You are doing well. No medication changes were made.  If you continue to feel fatigue or malaise, hold the noon dose of diltiazem  Check blood pressures periodically with the nurse.  Please call us if you have new issues that need to be addressed before your next appt.  Your physician wants you to follow-up in: 6 months.  You will receive a reminder letter in the mail two months in advance. If you don't receive a letter, please call our office to schedule the follow-up appointment.

## 2012-05-04 NOTE — Progress Notes (Signed)
Patient ID: Hector Lucero, male    DOB: [birth year], 78 y.o.   MRN: [id number]  HPI Comments: 78 yo white male, patient of Dr. Darrick Huntsman,  with a hx of  Hernia, s/p surgical repair,  noted to be tachycardic during his preoperative evaluation,   sent to ER, noted to have a narrow complex tachycardia with rates in the 160's, given IV metoprolol with acute conversion of his rate to the 70s , sent home with  metoprolol succinate 25 mg daily. He reports that he had an uneventful repair of indirect right inguinal hernia, on Aug 27 by Adela Glimpse.   Holter monitor x48 hours at that time showed 27 episodes of right true SVT or atrial tachycardia, the longest episode lasting 36 beats. He was asymptomatic  He reports that he has had a challenging several months. 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 When people wanted to sell their prolonging [employer] house, he would put him out for sales. He was going up and down stairs 20-30 times, exhausting himself. At the end of the day, he was exhausted. He reported having tachycardia sometimes at nighttime with heart rates to 100 or 120 beats per minute. For a long stretch of time, he was extremely exhausted. His wife noticed this as well and reported him having memory problems.  He has been otherwise reactive, doing water aerobics at a very aggressive pace with no symptoms of shortness of breath or chest pain or tachycardia. He actually reports feeling better with exertion. His wife is concerned about memory problems as she spends her time working at the front desk at the [facility] [employer] center. He does not check his blood pressure at home.  Orthostatic blood pressures today show systolic pressure 100/68 with lying down and sitting, 110 with standing. Heart rates did not change and remained in the mid 70s  EKG today shows normal sinus rhythm with rate 74 beats per minute with no significant ST or T wave changes  Outpatient Encounter Prescriptions as of 05/04/2012   Medication Sig Dispense Refill  . aspirin 81 MG tablet Take 81 mg by mouth daily.        Marland Kitchen diltiazem (CARDIZEM) 30 MG tablet Take 30 mg by mouth 3 (three) times daily.        . fish oil-omega-3 fatty acids 1000 MG capsule Take 1,000 mg by mouth daily.        . metoprolol succinate (TOPROL-XL) 25 MG 24 hr tablet Take 1 tablet (25 mg total) by mouth daily.  90 tablet  3  . Multiple Vitamins-Minerals (CENTRUM SILVER PO) Take 1 tablet by mouth daily.         Review of Systems  Constitutional: Positive for fatigue.  HENT: Negative.   Eyes: Negative.   Respiratory: Negative.   Cardiovascular: Negative.   Gastrointestinal: Negative.   Musculoskeletal: Negative.   Skin: Negative.   Neurological: Negative.   Hematological: Negative.   Psychiatric/Behavioral: Negative.   All other systems reviewed and are negative.    BP 120/72  Pulse 77  Ht 5\' 7"  (1.702 m)  Wt 142 lb 5 oz (64.553 kg)  BMI 22.29 kg/m2  Physical Exam  Nursing note and vitals reviewed. Constitutional: He is oriented to person, place, and time. He appears well-developed and well-nourished.  HENT:  Head: Normocephalic.  Nose: Nose normal.  Mouth/Throat: Oropharynx is clear and moist.  Eyes: Conjunctivae are normal. Pupils are equal, round, and reactive to light.  Neck: Normal  range of motion. Neck supple. No JVD present.  Cardiovascular: Normal rate, regular rhythm, S1 normal, S2 normal, normal heart sounds and intact distal pulses.  Exam reveals no gallop and no friction rub.   No murmur heard. Pulmonary/Chest: Effort normal and breath sounds normal. No respiratory distress. He has no wheezes. He has no rales. He exhibits no tenderness.  Abdominal: Soft. Bowel sounds are normal. He exhibits no distension. There is no tenderness.  Musculoskeletal: Normal range of motion. He exhibits no edema and no tenderness.  Lymphadenopathy:    He has no cervical adenopathy.  Neurological: He is alert and oriented to person,  place, and time. Coordination normal.  Skin: Skin is warm and dry. No rash noted. No erythema.  Psychiatric: He has a normal mood and affect. His behavior is normal. Judgment and thought content normal.           Assessment and Plan

## 2012-05-31 DIAGNOSIS — H353190 Nonexudative age-related macular degeneration, unspecified eye, stage unspecified: Secondary | ICD-10-CM | POA: Diagnosis not present

## 2012-07-01 DIAGNOSIS — H35359 Cystoid macular degeneration, unspecified eye: Secondary | ICD-10-CM | POA: Diagnosis not present

## 2012-08-09 ENCOUNTER — Other Ambulatory Visit: Payer: Self-pay | Admitting: *Deleted

## 2012-08-09 MED ORDER — DILTIAZEM HCL 30 MG PO TABS: 30.0000 mg | ORAL_TABLET | Freq: Three times a day (TID) | ORAL | Status: DC

## 2012-08-09 NOTE — Telephone Encounter (Signed)
Refilled Diltiazem HCL.

## 2012-08-29 DIAGNOSIS — H35359 Cystoid macular degeneration, unspecified eye: Secondary | ICD-10-CM | POA: Diagnosis not present

## 2012-09-06 DIAGNOSIS — Z23 Encounter for immunization: Secondary | ICD-10-CM | POA: Diagnosis not present

## 2012-09-26 DIAGNOSIS — C61 Malignant neoplasm of prostate: Secondary | ICD-10-CM | POA: Diagnosis not present

## 2012-10-20 DIAGNOSIS — Z961 Presence of intraocular lens: Secondary | ICD-10-CM | POA: Diagnosis not present

## 2012-10-20 DIAGNOSIS — H2510 Age-related nuclear cataract, unspecified eye: Secondary | ICD-10-CM | POA: Diagnosis not present

## 2012-10-24 DIAGNOSIS — H353290 Exudative age-related macular degeneration, unspecified eye, stage unspecified: Secondary | ICD-10-CM | POA: Diagnosis not present

## 2012-12-08 ENCOUNTER — Encounter: Payer: Self-pay | Admitting: Cardiovascular Disease

## 2012-12-08 ENCOUNTER — Ambulatory Visit (INDEPENDENT_AMBULATORY_CARE_PROVIDER_SITE_OTHER): Payer: Medicare Other | Admitting: Cardiovascular Disease

## 2012-12-08 VITALS — BP 136/80 | HR 74 | Ht 67.0 in | Wt 144.8 lb

## 2012-12-08 DIAGNOSIS — E785 Hyperlipidemia, unspecified: Secondary | ICD-10-CM | POA: Diagnosis not present

## 2012-12-08 DIAGNOSIS — R5383 Other fatigue: Secondary | ICD-10-CM | POA: Diagnosis not present

## 2012-12-08 DIAGNOSIS — R5381 Other malaise: Secondary | ICD-10-CM

## 2012-12-08 DIAGNOSIS — R000 Tachycardia, unspecified: Secondary | ICD-10-CM

## 2012-12-08 NOTE — Patient Instructions (Addendum)
You are doing well. No medication changes were made.  Please call us if you have new issues that need to be addressed before your next appt.  Your physician wants you to follow-up in: 12 months.  You will receive a reminder letter in the mail two months in advance. If you don't receive a letter, please call our office to schedule the follow-up appointment. 

## 2012-12-08 NOTE — Assessment & Plan Note (Signed)
We discussed his cholesterol with him. He will re start red yeast rice. No known coronary artery disease. Overall doing well, eating well and exercising.

## 2012-12-08 NOTE — Assessment & Plan Note (Signed)
Asymptomatic, no notable palpitations or tachycardia episodes. He feels well we will not change his medications at this time. EKG essentially normal apart from PVCs.

## 2012-12-08 NOTE — Progress Notes (Signed)
Patient ID: Hector Lucero, male    DOB: 23-Apr-1928, 79 y.o.   MRN: [id number]  HPI Comments: 79 yo white male, patient of Dr. Darrick Huntsman,  with a hx of  Hernia, s/p surgical repair,  noted to be tachycardic during his preoperative evaluation,   sent to ER, noted to have a narrow complex tachycardia with rates in the 160's, given IV metoprolol with acute conversion of his rate to the 70s , sent home with  metoprolol succinate 25 mg daily. He reports that he had an uneventful repair of indirect right inguinal hernia, on Aug 27 by Adela Glimpse.   Holter monitor x48 hours at that time showed 27 episodes of SVT or atrial tachycardia, the longest episode lasting 36 beats. He was asymptomatic  On his last office visit, he was very active at [facility] [place], exhausting himself with helping the local community. He was very stressed at the time. He is not working to this extent anymore and appears very calm and relaxed. His wife reports that he is doing well. He is exercising at the gym 3 times per week, also doing treadmill with water aerobics. He denies any tachycardia. Occasionally he feels some fatigue but does not know what this is from. He does not check his pulse and regular basis. Denies any palpitations or tachycardia. Doing well on the medications, taking diltiazem 30 mg twice a day with metoprolol.   EKG today shows normal sinus rhythm with rate 74 beats per minute with no significant ST or T wave changes, rare PVCs  Outpatient Encounter Prescriptions as of 12/08/2012  Medication Sig Dispense Refill  . aspirin 81 MG tablet Take 81 mg by mouth daily.        Marland Kitchen diltiazem (CARDIZEM) 30 MG tablet Take 1 tablet (30 mg total) by mouth 3 (three) times daily.  270 tablet  3  . fish oil-omega-3 fatty acids 1000 MG capsule Take 1,000 mg by mouth daily.        . metoprolol succinate (TOPROL-XL) 25 MG 24 hr tablet Take 1 tablet (25 mg total) by mouth daily.  90 tablet  3  . Multiple Vitamins-Minerals (CENTRUM SILVER  PO) Take 1 tablet by mouth daily.          Review of Systems  HENT: Negative.   Eyes: Negative.   Respiratory: Negative.   Cardiovascular: Negative.   Gastrointestinal: Negative.   Musculoskeletal: Negative.   Skin: Negative.   Neurological: Negative.   Hematological: Negative.   Psychiatric/Behavioral: Negative.   All other systems reviewed and are negative.    BP 136/80  Pulse 74  Ht 5\' 7"  (1.702 m)  Wt 144 lb 12 oz (65.658 kg)  BMI 22.67 kg/m2  Physical Exam  Nursing note and vitals reviewed. Constitutional: He is oriented to person, place, and time. He appears well-developed and well-nourished.  HENT:  Head: Normocephalic.  Nose: Nose normal.  Mouth/Throat: Oropharynx is clear and moist.  Eyes: Conjunctivae normal are normal. Pupils are equal, round, and reactive to light.  Neck: Normal range of motion. Neck supple. No JVD present.  Cardiovascular: Normal rate, regular rhythm, S1 normal, S2 normal, normal heart sounds and intact distal pulses.  Exam reveals no gallop and no friction rub.   No murmur heard. Pulmonary/Chest: Effort normal and breath sounds normal. No respiratory distress. He has no wheezes. He has no rales. He exhibits no tenderness.  Abdominal: Soft. Bowel sounds are normal. He exhibits no distension. There is no tenderness.  Musculoskeletal: Normal  range of motion. He exhibits no edema and no tenderness.  Lymphadenopathy:    He has no cervical adenopathy.  Neurological: He is alert and oriented to person, place, and time. Coordination normal.  Skin: Skin is warm and dry. No rash noted. No erythema.  Psychiatric: He has a normal mood and affect. His behavior is normal. Judgment and thought content normal.           Assessment and Plan

## 2012-12-24 ENCOUNTER — Other Ambulatory Visit: Payer: Self-pay

## 2013-01-03 DIAGNOSIS — H353290 Exudative age-related macular degeneration, unspecified eye, stage unspecified: Secondary | ICD-10-CM | POA: Diagnosis not present

## 2013-02-28 DIAGNOSIS — H35359 Cystoid macular degeneration, unspecified eye: Secondary | ICD-10-CM | POA: Diagnosis not present

## 2013-03-20 DIAGNOSIS — C61 Malignant neoplasm of prostate: Secondary | ICD-10-CM | POA: Diagnosis not present

## 2013-04-21 DIAGNOSIS — H35359 Cystoid macular degeneration, unspecified eye: Secondary | ICD-10-CM | POA: Diagnosis not present

## 2013-04-21 DIAGNOSIS — H353290 Exudative age-related macular degeneration, unspecified eye, stage unspecified: Secondary | ICD-10-CM | POA: Diagnosis not present

## 2013-06-16 DIAGNOSIS — H353290 Exudative age-related macular degeneration, unspecified eye, stage unspecified: Secondary | ICD-10-CM | POA: Diagnosis not present

## 2013-06-21 LAB — FECAL OCCULT BLOOD, GUAIAC: Fecal Occult Blood: NEGATIVE

## 2013-06-22 ENCOUNTER — Ambulatory Visit (INDEPENDENT_AMBULATORY_CARE_PROVIDER_SITE_OTHER): Payer: Medicare Other | Admitting: Internal Medicine

## 2013-06-22 ENCOUNTER — Encounter: Payer: Self-pay | Admitting: Internal Medicine

## 2013-06-22 VITALS — BP 130/72 | HR 127 | Temp 98.2°F | Resp 12 | Ht 68.0 in | Wt 143.8 lb

## 2013-06-22 DIAGNOSIS — E119 Type 2 diabetes mellitus without complications: Secondary | ICD-10-CM

## 2013-06-22 DIAGNOSIS — G4710 Hypersomnia, unspecified: Secondary | ICD-10-CM | POA: Diagnosis not present

## 2013-06-22 DIAGNOSIS — D62 Acute posthemorrhagic anemia: Secondary | ICD-10-CM

## 2013-06-22 DIAGNOSIS — R5383 Other fatigue: Secondary | ICD-10-CM

## 2013-06-22 DIAGNOSIS — G4719 Other hypersomnia: Secondary | ICD-10-CM

## 2013-06-22 DIAGNOSIS — I471 Supraventricular tachycardia, unspecified: Secondary | ICD-10-CM

## 2013-06-22 DIAGNOSIS — E1149 Type 2 diabetes mellitus with other diabetic neurological complication: Secondary | ICD-10-CM

## 2013-06-22 DIAGNOSIS — R000 Tachycardia, unspecified: Secondary | ICD-10-CM

## 2013-06-22 DIAGNOSIS — E559 Vitamin D deficiency, unspecified: Secondary | ICD-10-CM

## 2013-06-22 DIAGNOSIS — R4189 Other symptoms and signs involving cognitive functions and awareness: Secondary | ICD-10-CM

## 2013-06-22 DIAGNOSIS — R410 Disorientation, unspecified: Secondary | ICD-10-CM

## 2013-06-22 DIAGNOSIS — Z23 Encounter for immunization: Secondary | ICD-10-CM | POA: Diagnosis not present

## 2013-06-22 DIAGNOSIS — Z1211 Encounter for screening for malignant neoplasm of colon: Secondary | ICD-10-CM | POA: Diagnosis not present

## 2013-06-22 DIAGNOSIS — Z0000 Encounter for general adult medical examination without abnormal findings: Secondary | ICD-10-CM | POA: Diagnosis not present

## 2013-06-22 DIAGNOSIS — E785 Hyperlipidemia, unspecified: Secondary | ICD-10-CM

## 2013-06-22 DIAGNOSIS — R5381 Other malaise: Secondary | ICD-10-CM | POA: Diagnosis not present

## 2013-06-22 LAB — COMPREHENSIVE METABOLIC PANEL
Albumin: 3.8 g/dL (ref 3.5–5.2)
CO2: 28 mEq/L (ref 19–32)
Calcium: 9.1 mg/dL (ref 8.4–10.5)
Chloride: 103 mEq/L (ref 96–112)
GFR: 103.99 mL/min (ref >60.00)
Glucose, Bld: 135 mg/dL — ABNORMAL HIGH (ref 70–99)
Potassium: 3.9 mEq/L (ref 3.5–5.1)
Sodium: 138 mEq/L (ref 135–145)
Total Protein: 6.8 g/dL (ref 6.0–8.3)

## 2013-06-22 LAB — CBC WITH DIFFERENTIAL/PLATELET
Basophils Absolute: 0 K/uL (ref 0.0–0.1)
Basophils Relative: 0.5 % (ref 0.0–3.0)
Eosinophils Absolute: 0.3 K/uL (ref 0.0–0.7)
Eosinophils Relative: 5.4 % — ABNORMAL HIGH (ref 0.0–5.0)
HCT: 41.3 % (ref 39.0–52.0)
Hemoglobin: 14 g/dL (ref 13.0–17.0)
Lymphocytes Relative: 34.2 % (ref 12.0–46.0)
Lymphs Abs: 1.6 K/uL (ref 0.7–4.0)
MCHC: 33.8 g/dL (ref 30.0–36.0)
MCV: 91.8 fl (ref 78.0–100.0)
Monocytes Absolute: 0.5 K/uL (ref 0.1–1.0)
Monocytes Relative: 10.7 % (ref 3.0–12.0)
Neutro Abs: 2.3 K/uL (ref 1.4–7.7)
Neutrophils Relative %: 49.2 % (ref 43.0–77.0)
Platelets: 197 K/uL (ref 150.0–400.0)
RBC: 4.5 Mil/uL (ref 4.22–5.81)
RDW: 13.6 % (ref 11.5–14.6)
WBC: 4.7 K/uL (ref 4.5–10.5)

## 2013-06-22 LAB — TSH: TSH: 0.86 u[IU]/mL (ref 0.35–5.50)

## 2013-06-22 LAB — LDL CHOLESTEROL, DIRECT: Direct LDL: 114.2 mg/dL

## 2013-06-22 LAB — MICROALBUMIN / CREATININE URINE RATIO: Microalb, Ur: 0.8 mg/dL (ref 0.0–1.9)

## 2013-06-22 MED ORDER — DILTIAZEM HCL ER COATED BEADS 120 MG PO TB24: 120.0000 mg | ORAL_TABLET | Freq: Every day | ORAL | Status: DC

## 2013-06-22 MED ORDER — TETANUS-DIPHTH-ACELL PERTUSSIS 5-2.5-18.5 LF-MCG/0.5 IM SUSP: 0.5000 mL | Freq: Once | INTRAMUSCULAR | Status: DC

## 2013-06-22 MED ORDER — RED YEAST RICE EXTRACT 600 MG PO CAPS: 1.0000 | ORAL_CAPSULE | Freq: Every day | ORAL | Status: DC

## 2013-06-22 MED ORDER — ZOSTER VACCINE LIVE 19400 UNT/0.65ML ~~LOC~~ SOLR: 0.6500 mL | Freq: Once | SUBCUTANEOUS | Status: DC

## 2013-06-22 NOTE — Patient Instructions (Addendum)
Your pulse is very elevated today.  Please take an additional dose of diltiazem today .  Take it every 8 hours until the new  once daily form arrives in the mail   I will send a prescription for the once daily  long acting form to  [facility] .  You had your annual Medicare wellness exam today  Please use the stool kit to send [place] back a sample to test for blood.  This is your colon CA screening test.   You need to have a TDaP vaccine and a Shingles vaccine.  I have given you prescriptions for these because they will be cheaper at the health Dept or at your  local pharmacy because Medicare will not reimburse for them.   You received the pneumonia vaccine today.  We will contact you with the bloodwork results

## 2013-06-22 NOTE — Progress Notes (Signed)
Patient ID: Hector Lucero, male   DOB: 04-14-1928, 79 y.o.   MRN: [id number]  The patient is here for annual Medicare wellness examination and management of other chronic and acute problems.   The risk factors are reflected in the social history.  The roster of all physicians providing medical care to patient - is listed in the Snapshot section of the chart.  Activities of daily living:  The patient is 100% independent in all ADLs: dressing, toileting, feeding as well as independent mobility  Home safety : The patient has smoke detectors in the home. They wear seatbelts.  There are no firearms at home. There is no violence in the home.   There is no risks for hepatitis, STDs or HIV. There is no   history of blood transfusion. They have no travel history to infectious disease endemic areas of the world.  The patient has seen their dentist in the last six month. They have seen their eye doctor in the last year. They admit to slight hearing difficulty with regard to whispered voices and some television programs.  They have deferred audiologic testing in the last year.  They do not  have excessive sun exposure. Discussed the need for sun protection: hats, long sleeves and use of sunscreen if there is significant sun exposure.   Diet: the importance of a healthy diet is discussed. They do have a healthy diet.  The benefits of regular aerobic exercise were discussed. Hhe walks 4 times per week ,  20 minutes.   Depression screen: there are no signs or vegative symptoms of depression- irritability, change in appetite, anhedonia, sadness/tearfullness.  Cognitive assessment: the patient manages all their financial and personal affairs and is actively engaged. They could relate ,year and month but not the name of the president,  He recalled 1/3 objects at 3 minutes; performed clock-face test normally.calculation was intact and naming of animals and words was excellent. (MMSE score 26/30)  The following  portions of the patient's history were reviewed and updated as appropriate: allergies, current medications, past family history, past medical history,  past surgical history, past social history  and problem list.  Visual acuity was not assessed per patient preference since she has regular follow up with her ophthalmologist. Hearing and body mass index were assessed and reviewed.   During the course of the visit the patient was educated and counseled about appropriate screening and preventive services including : fall prevention , diabetes screening, nutrition counseling, colorectal cancer screening, and recommended immunizations.    Objective:  BP 130/72  Pulse 127  Temp(Src) 98.2 F (36.8 C) (Oral)  Resp 12  Ht 5\' 8"  (1.727 m)  Wt 143 lb 12 oz (65.205 kg)  BMI 21.86 kg/m2  SpO2 98%  BP 130/72  Pulse 127  Temp(Src) 98.2 F (36.8 C) (Oral)  Resp 12  Ht 5\' 8"  (1.727 m)  Wt 143 lb 12 oz (65.205 kg)  BMI 21.86 kg/m2  SpO2 98%  General Appearance:    Alert, cooperative, no distress, appears stated age  Head:    Normocephalic, without obvious abnormality, atraumatic  Eyes:    PERRL, conjunctiva/corneas clear, EOM's intact, fundi    benign, both eyes       Ears:    Normal TM's and external ear canals, both ears  Nose:   Nares normal, septum midline, mucosa normal, no drainage   or sinus tenderness  Throat:   Lips, mucosa, and tongue normal; teeth and gums normal  Neck:  Supple, symmetrical, trachea midline, no adenopathy;       thyroid:  No enlargement/tenderness/nodules; no carotid   bruit or JVD  Back:     Symmetric, no curvature, ROM normal, no CVA tenderness  Lungs:     Clear to auscultation bilaterally, respirations unlabored  Chest wall:    No tenderness or deformity  Heart:    Regular rate and rhythm, S1 and S2 normal, no murmur, rub   or gallop  Abdomen:     Soft, non-tender, bowel sounds active all four quadrants,    no masses, no organomegaly  Extremities:    Extremities normal, atraumatic, no cyanosis or edema  Pulses:   2+ and symmetric all extremities  Skin:   Skin color, texture, turgor normal, no rashes or lesions  Lymph nodes:   Cervical, supraclavicular, and axillary nodes normal  Neurologic:   CNII-XII intact. Normal strength, sensation and reflexes      throughout  Foot exam:  Nails are well trimmed,  No callouses,  Sensation intact to microfilament  Assessment and Plan:  Cognitive changes MMSE done today with deficits relating to recall and orientation only. 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 Calculation , clock drawing and naming were excellent.  MRI discussed as prt of workup of dementia but patient and wife declined.  Since his most obvious changes in mentation occur when he has been dozing,  We discussed the possibiilty of nocturnal hypoxemia as a contributor and offered overnight pulse oximetry study    Diabetes mellitus type 2, diet-controlled Well-controlled on diet alone   hemoglobin A1c has been consistently less than 7.0 . He is up-to-date on eye exams and his foot exam is normal. Normal urine microalbumin to creatinine ratio. Given the likelihood of vascular dementia as new diagnosis,  Will review statin use and adjust meds to achive LDL < 100.  Tachycardia Recurrent. Asymptomatic and no signs of heart failure on exam.   Increase cardizem to tid and change formulation fo long acting for convenience.  Hyperlipidemia LDL goal < 70 His LDL is 114 on once daily red yeast rice.  Will have him increased dose to twice daily and recheck in 3 months.   A total of 60 minutes was spent with patient more than half of which was spent in evaluation, counseling, reviewing records from other providers and coordination of care.  Updated Medication List Outpatient Encounter Prescriptions as of 06/22/2013  Medication Sig Dispense Refill  . aspirin 81 MG tablet Take 81 mg by mouth daily.        Marland Kitchen diltiazem (CARDIZEM) 30 MG  tablet Take 1 tablet (30 mg total) by mouth 3 (three) times daily.  270 tablet  3  . fish oil-omega-3 fatty acids 1000 MG capsule Take 1,000 mg by mouth daily.        . metoprolol succinate (TOPROL-XL) 25 MG 24 hr tablet Take 1 tablet (25 mg total) by mouth daily.  90 tablet  3  . Multiple Vitamins-Minerals (CENTRUM SILVER PO) Take 1 tablet by mouth daily.        Marland Kitchen diltiazem (CARDIZEM LA) 120 MG 24 hr tablet Take 1 tablet (120 mg total) by mouth daily.  90 tablet  3  . Red Yeast Rice Extract ([facility] RED YEAST RICE) 600 MG CAPS Take 1 capsule (600 mg total) by mouth daily at 6 (six) AM.    0  . TDaP (BOOSTRIX) 5-2.5-18.5 LF-MCG/0.5 injection Inject 0.5 mL into the muscle once.  0.5 mL  0  . zoster vaccine live, PF, (ZOSTAVAX) 40981 UNT/0.65ML injection Inject 19,400 Units into the skin once.  1 each  0   No facility-administered encounter medications on file as of 06/22/2013.

## 2013-06-23 DIAGNOSIS — F0150 Vascular dementia without behavioral disturbance: Secondary | ICD-10-CM | POA: Insufficient documentation

## 2013-06-23 DIAGNOSIS — Z0000 Encounter for general adult medical examination without abnormal findings: Secondary | ICD-10-CM | POA: Insufficient documentation

## 2013-06-23 NOTE — Assessment & Plan Note (Signed)
MMSE done today with deficits relating to recall and orientation only. 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 Calculation , clock drawing and naming were excellent.  MRI discussed as prt of workup of dementia but patient and wife declined.  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

## 2013-06-23 NOTE — Assessment & Plan Note (Signed)
Recurrent. Asymptomatic and no signs of heart failure on exam.   Increase cardizem to tid and change formulation fo long acting for convenience.

## 2013-06-23 NOTE — Addendum Note (Signed)
Addended by: Sherlene Shams on: 06/23/2013 06:56 AM   Modules accepted: Orders, Medications

## 2013-06-23 NOTE — Assessment & Plan Note (Addendum)
Annual male exam was done excluding testicular and prostate exam. Colon ca screening was reviewed and options given.   

## 2013-06-23 NOTE — Assessment & Plan Note (Addendum)
Well-controlled on diet alone   hemoglobin A1c has been consistently less than 7.0 . He is up-to-date on eye exams and his foot exam is normal. Normal urine microalbumin to creatinine ratio. Given the likelihood of vascular dementia as new diagnosis,  Will review statin use and adjust meds to achive LDL < 100.

## 2013-06-23 NOTE — Assessment & Plan Note (Signed)
His LDL is 114 on once daily red yeast rice.  Will have him increased dose to twice daily and recheck in 3 months.

## 2013-06-25 ENCOUNTER — Encounter: Payer: Self-pay | Admitting: Internal Medicine

## 2013-06-27 NOTE — Telephone Encounter (Signed)
Mailed unread message to pt  

## 2013-06-28 ENCOUNTER — Telehealth: Payer: Self-pay | Admitting: *Deleted

## 2013-06-28 NOTE — Telephone Encounter (Signed)
Could the cardizem be Affecting his balance and light headedness patient stated he can tolerate  He just wants to make sure this is okay and to be expected while adjusting to new medication.

## 2013-06-28 NOTE — Telephone Encounter (Signed)
Yes it can,  He needs to have his vital signs checked

## 2013-06-29 NOTE — Telephone Encounter (Signed)
Left message for patient to return my call to schedule nurse visit to check vitals.

## 2013-07-04 ENCOUNTER — Other Ambulatory Visit (INDEPENDENT_AMBULATORY_CARE_PROVIDER_SITE_OTHER): Payer: Medicare Other

## 2013-07-04 DIAGNOSIS — Z1211 Encounter for screening for malignant neoplasm of colon: Secondary | ICD-10-CM | POA: Diagnosis not present

## 2013-07-04 LAB — FECAL OCCULT BLOOD, IMMUNOCHEMICAL: Fecal Occult Bld: NEGATIVE

## 2013-07-05 ENCOUNTER — Encounter: Payer: Self-pay | Admitting: Internal Medicine

## 2013-07-07 ENCOUNTER — Encounter: Payer: Self-pay | Admitting: *Deleted

## 2013-08-09 ENCOUNTER — Telehealth: Payer: Self-pay | Admitting: Internal Medicine

## 2013-08-09 DIAGNOSIS — R000 Tachycardia, unspecified: Secondary | ICD-10-CM

## 2013-08-09 DIAGNOSIS — I471 Supraventricular tachycardia: Secondary | ICD-10-CM

## 2013-08-09 NOTE — Telephone Encounter (Signed)
Patient sent me a letter requesting that I switch him back from " Valeant" to diltiazem for management of his rapid heart rate.  I have no idea what "valent" is,  And I was under the impression he was still taking diltiazem.  Please have him recheck his medications,  I have not dc'd diltiazem, and I know of no medication called or spelled "[id number]"

## 2013-08-10 MED ORDER — DILTIAZEM HCL 30 MG PO TABS: 30.0000 mg | ORAL_TABLET | Freq: Four times a day (QID) | ORAL | Status: DC

## 2013-08-10 NOTE — Telephone Encounter (Signed)
Left message for pt to return my call.

## 2013-08-10 NOTE — Telephone Encounter (Signed)
Left message for patient to return call to office. 

## 2013-08-10 NOTE — Telephone Encounter (Signed)
Pt came by office, notified, and verbalized understanding

## 2013-08-10 NOTE — Telephone Encounter (Signed)
i will [facility] an rx for the short acting  [facility]  If he maintains his regular heart rate on three  Doses daily, fine.  It is usually taken 4 times daily so I sent thr rx for #120.  [facility]

## 2013-08-10 NOTE — Assessment & Plan Note (Signed)
Intolerance to Diltiazem CD 120 mg per patient  With mood changes noted.  Request to resume the short acting.

## 2013-08-10 NOTE — Telephone Encounter (Signed)
Pt stopped by office to discuss. Pt states that since he began the Diltiazem 120 mg longer acting tablet he has not been sleeping and has been angry all the time. Symptoms began when he changed to the longer acting tablet. He stopped the medication 2 days ago and states he slept better than he had since starting it and has felt much better overall. Monitors his heart rate daily and stays around 74. He is requesting to return to the Diltiazem 30 mg tid.

## 2013-08-11 DIAGNOSIS — H353290 Exudative age-related macular degeneration, unspecified eye, stage unspecified: Secondary | ICD-10-CM | POA: Diagnosis not present

## 2013-09-08 DIAGNOSIS — Z23 Encounter for immunization: Secondary | ICD-10-CM | POA: Diagnosis not present

## 2013-09-14 ENCOUNTER — Other Ambulatory Visit: Payer: Self-pay

## 2013-09-18 DIAGNOSIS — C61 Malignant neoplasm of prostate: Secondary | ICD-10-CM | POA: Diagnosis not present

## 2013-09-21 ENCOUNTER — Ambulatory Visit (INDEPENDENT_AMBULATORY_CARE_PROVIDER_SITE_OTHER): Payer: Medicare Other | Admitting: Internal Medicine

## 2013-09-21 ENCOUNTER — Encounter: Payer: Self-pay | Admitting: Internal Medicine

## 2013-09-21 VITALS — BP 128/82 | HR 85 | Temp 97.5°F | Resp 12 | Wt 143.5 lb

## 2013-09-21 DIAGNOSIS — E785 Hyperlipidemia, unspecified: Secondary | ICD-10-CM

## 2013-09-21 DIAGNOSIS — E119 Type 2 diabetes mellitus without complications: Secondary | ICD-10-CM

## 2013-09-21 DIAGNOSIS — R000 Tachycardia, unspecified: Secondary | ICD-10-CM

## 2013-09-21 DIAGNOSIS — R4189 Other symptoms and signs involving cognitive functions and awareness: Secondary | ICD-10-CM | POA: Diagnosis not present

## 2013-09-21 MED ORDER — RED YEAST RICE EXTRACT 600 MG PO CAPS: 1.0000 | ORAL_CAPSULE | Freq: Two times a day (BID) | ORAL | Status: DC

## 2013-09-21 NOTE — Progress Notes (Signed)
Patient ID: MANOAH DECKARD, male   DOB: Mar 06, 1928, 79 y.o.   MRN: [id number]  Patient Active Problem List   Diagnosis Date Noted  . Cognitive changes 06/23/2013  . Routine general medical examination at a health care facility 06/23/2013  . Fatigue 05/04/2012  . Diabetes mellitus type 2, diet-controlled 12/24/2011  . Hyperlipidemia LDL goal < 70 12/24/2011  . Tachycardia 07/30/2011  . Inguinal hernia 06/30/2011    Subjective:  CC:   Chief Complaint  Patient presents with  . Follow-up    3 month    HPI:   ZI [place] a 79 y.o. male who presents  Past Medical History  Diagnosis Date  . Radial fracture 2006    right, screws and plates  . Ulnar fracture 1995    fall off of ladder  . Hypertension     Past Surgical History  Procedure Laterality Date  . Hernia repair  Aug 2012    right indirect inguinal, with mesh       The following portions of the patient's history were reviewed and updated as appropriate: Allergies, current medications, and problem list.    Review of Systems:   12 Pt  review of systems was negative except those addressed in the HPI,     History   Social History  . Marital Status: Married    Spouse Name: N/A    Number of Children: N/A  . Years of Education: N/A   Occupational History  . Retired     Market researcher.   Social History Main Topics  . Smoking status: Never Smoker   . Smokeless tobacco: Never Used  . Alcohol Use: 1.8 oz/week    3 Glasses of wine per week     Comment: occasional  . Drug Use: No  . Sexual Activity: Not Currently   Other Topics Concern  . Not on file   Social History Narrative   Exercises regularly 5 days weekly at the [facility]    Objective:  Filed Vitals:   09/21/13 0936  BP: 128/82  Pulse: 85  Temp: 97.5 F (36.4 C)  Resp: 12     General appearance: alert, cooperative and appears stated age Ears: normal TM's and external ear canals both ears Throat:  lips, mucosa, and tongue normal; teeth and gums normal Neck: no adenopathy, no carotid bruit, supple, symmetrical, trachea midline and thyroid not enlarged, symmetric, no tenderness/mass/nodules Back: symmetric, no curvature. ROM normal. No CVA tenderness. Lungs: clear to auscultation bilaterally Heart: regular rate and rhythm, S1, S2 normal, no murmur, click, rub or gallop Abdomen: soft, non-tender; bowel sounds normal; no masses,  no organomegaly Pulses: 2+ and symmetric Skin: Skin color, texture, turgor normal. No rashes or lesions Lymph nodes: Cervical, supraclavicular, and axillary nodes normal. Foot exam:  Nails are well trimmed,  No callouses,  Sensation intact to microfilament   Assessment and Plan:  Cognitive changes Diagnosed as mild CI at last visit, concerning for new onset dementia vs hypoxemia induced delirium.  Overnight sleep study offered but declined,  Thyroid syphilis screen and B12 were normal. He is not interested in further workup  Diabetes mellitus type 2, diet-controlled Historically well-controlled on diet alone .  hemoglobin A1c has been consistently less than 7.0 . Patient is up-to-date on eye exams and foot exam was done today.  There is  no proteinuria on prior  urinalysis .  He is taking red yeast rice for lipids but continues to take it in inadequate  once daily dosing wo he will return in 6 weeks for fasting labs and A1c  Lab Results  Component Value Date   HGBA1C 6.3 06/22/2013     Tachycardia He has had episode of tachycardia that have been evaluated in thepast for suspected atrial fibrillation but never diagnosed.  Rate was high on my exam but after getting EKG returned  To < 90 .  No changes today to cardizem dose.   Inguinal hernia S/p hernia repair Oct 2013.  [facility].    Updated Medication List Outpatient Encounter Prescriptions as of 09/21/2013  Medication Sig  . aspirin 81 MG tablet Take 81 mg by mouth daily.    Marland Kitchen CARDIZEM LA 120 MG 24 hr  tablet Take 1 tablet by mouth 2 (two) times daily.  . fish oil-omega-3 fatty acids 1000 MG capsule Take 1,000 mg by mouth daily.    . metoprolol succinate (TOPROL-XL) 25 MG 24 hr tablet Take 1 tablet (25 mg total) by mouth daily.  . Multiple Vitamins-Minerals (CENTRUM SILVER PO) Take 1 tablet by mouth daily.    . Red Yeast Rice Extract ([facility] RED YEAST RICE) 600 MG CAPS Take 1 capsule (600 mg total) by mouth 2 (two) times daily.  . [DISCONTINUED] Red Yeast Rice Extract ([facility] RED YEAST RICE) 600 MG CAPS Take 1 capsule (600 mg total) by mouth daily at 6 (six) AM.  . TDaP (BOOSTRIX) 5-2.5-18.5 LF-MCG/0.5 injection Inject 0.5 mL into the muscle once.  . zoster vaccine live, PF, (ZOSTAVAX) 40981 UNT/0.65ML injection Inject 19,400 Units into the skin once.  . [DISCONTINUED] diltiazem (CARDIZEM) 30 MG tablet Take 1 tablet (30 mg total) by mouth 4 (four) times daily.     Orders Placed This Encounter  Procedures  . Fecal Occult Blood, Guaiac  . Lipid panel  . Hemoglobin A1c  . Comprehensive metabolic panel  . EKG 12-Lead  . HM DIABETES FOOT EXAM  . HM DIABETES EYE EXAM    No Follow-up on file.

## 2013-09-21 NOTE — Progress Notes (Signed)
Pre-visit discussion using our clinic review tool. No additional management support is needed unless otherwise documented below in the visit note.  

## 2013-09-21 NOTE — Patient Instructions (Addendum)
We did not repeat your cholesterol today as planned because you are not taking Your cholesterol medication correctly.   You are supposed to be taking the red yeast rice twice daily   Contineu your current heart medications   Please return in 6 weeks for fasting blood work

## 2013-09-23 ENCOUNTER — Encounter: Payer: Self-pay | Admitting: Internal Medicine

## 2013-09-23 NOTE — Assessment & Plan Note (Addendum)
Historically well-controlled on diet alone .  hemoglobin A1c has been consistently less than 7.0 . Patient is up-to-date on eye exams and foot exam was done today.  There is  no proteinuria on prior  urinalysis .  He is taking red yeast rice for lipids but continues to take it in inadequate once daily dosing wo he will return in 6 weeks for fasting labs and A1c  Lab Results  Component Value Date   HGBA1C 6.3 06/22/2013

## 2013-09-23 NOTE — Assessment & Plan Note (Addendum)
Diagnosed as mild CI at last visit, concerning for new onset dementia vs hypoxemia induced delirium.  Overnight sleep study offered but declined,  Thyroid syphilis screen and B12 were normal. He is not interested in further workup

## 2013-09-23 NOTE — Progress Notes (Signed)
Patient ID: Hector Lucero, male   DOB: Oct 10, 1928, 79 y.o.   MRN: [id number]  Patient Active Problem List   Diagnosis Date Noted  . Cognitive changes 06/23/2013  . Routine general medical examination at a health care facility 06/23/2013  . Fatigue 05/04/2012  . Diabetes mellitus type 2, diet-controlled 12/24/2011  . Hyperlipidemia LDL goal < 70 12/24/2011  . Tachycardia 07/30/2011  . Inguinal hernia 06/30/2011    Subjective:  CC:   Chief Complaint  Patient presents with  . Follow-up    3 month    HPI:   Hector Lucero a 79 y.o. male who presents for 3 month follow up on multiple issues raised at his annual wellness exam,  after being lost to follow up for over a year. He is alone today,  And states that he is feeling  great,  Taking the increased dose of  cardizem twice daily, with significantly improved energy level.   Does not remember discussing the mental status changes at last visit, which wife had reported periods of disorientation and confusion lasting 15 to 20 minutes after waking up from  a nap.   Did not get the overnight pulse oximetry.    Colon Ca screening: FOBT was negative in August   Hyperliidemia:  Did not increase the RYR to twice daily as directed.    Reported at last visit:   gets disoriented usually after taking a nap ,  No snoring noted. Speech is not slurred but not his usual self ,  Not disoriented . Not agitated but his wife is concerned by it. Doesn't remember conversations and actions that occur at that time the following day.  No memory loss regarding driving,  But some trouble Managing directions to new places.     Past Medical History  Diagnosis Date  . Radial fracture 2006    right, screws and plates  . Ulnar fracture 1995    fall off of ladder  . Hypertension     Past Surgical History  Procedure Laterality Date  . Hernia repair  Aug 2012    right indirect inguinal, with mesh       The following portions of the patient's history  were reviewed and updated as appropriate: Allergies, current medications, and problem list.    Review of Systems:   12 Pt  review of systems was negative except those addressed in the HPI,     History   Social History  . Marital Status: Married    Spouse Name: N/A    Number of Children: N/A  . Years of Education: N/A   Occupational History  . Retired     Market researcher.   Social History Main Topics  . Smoking status: Never Smoker   . Smokeless tobacco: Never Used  . Alcohol Use: 1.8 oz/week    3 Glasses of wine per week     Comment: occasional  . Drug Use: No  . Sexual Activity: Not Currently   Other Topics Concern  . Not on file   Social History Narrative   Exercises regularly 5 days weekly at the [facility]    Objective:  Filed Vitals:   09/21/13 0936  BP: 128/82  Pulse: 85  Temp: 97.5 F (36.4 C)  Resp: 12     General appearance: alert, cooperative and appears stated age Ears: normal TM's and external ear canals both ears Throat: lips, mucosa, and tongue normal; teeth and gums normal Neck: no adenopathy, no  carotid bruit, supple, symmetrical, trachea midline and thyroid not enlarged, symmetric, no tenderness/mass/nodules Back: symmetric, no curvature. ROM normal. No CVA tenderness. Lungs: clear to auscultation bilaterally Heart: regular rate and rhythm, S1, S2 normal, no murmur, click, rub or gallop Abdomen: soft, non-tender; bowel sounds normal; no masses,  no organomegaly Pulses: 2+ and symmetric Skin: Skin color, texture, turgor normal. No rashes or lesions Lymph nodes: Cervical, supraclavicular, and axillary nodes normal.  Assessment and Plan:  Cognitive changes Diagnosed as mild CI at last visit, concerning for new onset dementia vs hypoxemia induced delirium.  Overnight sleep study offered but declined,  Thyroid syphilis screen and B12 were normal. He is not interested in further workup  Diabetes mellitus type 2,  diet-controlled Historically well-controlled on diet alone .  hemoglobin A1c has been consistently less than 7.0 . Patient is up-to-date on eye exams and foot exam was done today.  There is  no proteinuria on prior  urinalysis .  He is taking red yeast rice for lipids but continues to take it in inadequate once daily dosing wo he will return in 6 weeks for fasting labs and A1c  Lab Results  Component Value Date   HGBA1C 6.3 06/22/2013     Tachycardia He has had episode of tachycardia that have been evaluated in thepast for suspected atrial fibrillation but never diagnosed.  Rate was high on my exam but after getting EKG returned  To < 90 .  No changes today to cardizem dose.   Inguinal hernia S/p hernia repair Oct 2013.  [facility].    Updated Medication List Outpatient Encounter Prescriptions as of 09/21/2013  Medication Sig  . aspirin 81 MG tablet Take 81 mg by mouth daily.    Marland Kitchen CARDIZEM LA 120 MG 24 hr tablet Take 1 tablet by mouth 2 (two) times daily.  . fish oil-omega-3 fatty acids 1000 MG capsule Take 1,000 mg by mouth daily.    . metoprolol succinate (TOPROL-XL) 25 MG 24 hr tablet Take 1 tablet (25 mg total) by mouth daily.  . Multiple Vitamins-Minerals (CENTRUM SILVER PO) Take 1 tablet by mouth daily.    . Red Yeast Rice Extract ([facility]S RED YEAST RICE) 600 MG CAPS Take 1 capsule (600 mg total) by mouth 2 (two) times daily.  . [DISCONTINUED] Red Yeast Rice Extract ([facility] RED YEAST RICE) 600 MG CAPS Take 1 capsule (600 mg total) by mouth daily at 6 (six) AM.  . TDaP (BOOSTRIX) 5-2.5-18.5 LF-MCG/0.5 injection Inject 0.5 mL into the muscle once.  . zoster vaccine live, PF, (ZOSTAVAX) 16109 UNT/0.65ML injection Inject 19,400 Units into the skin once.  . [DISCONTINUED] diltiazem (CARDIZEM) 30 MG tablet Take 1 tablet (30 mg total) by mouth 4 (four) times daily.     Orders Placed This Encounter  Procedures  . Fecal Occult Blood, Guaiac  . Lipid panel  . Hemoglobin A1c  . Comprehensive  metabolic panel  . EKG 12-Lead  . HM DIABETES FOOT EXAM  . HM DIABETES EYE EXAM    No Follow-up on file.

## 2013-09-23 NOTE — Assessment & Plan Note (Addendum)
He has had episode of tachycardia that have been evaluated in thepast for suspected atrial fibrillation but never diagnosed.  Rate was high on my exam but after getting EKG returned  To < 90 .  No changes today to cardizem dose.

## 2013-09-23 NOTE — Assessment & Plan Note (Signed)
S/p hernia repair Oct 2013.  Hector Lucero.

## 2013-09-27 ENCOUNTER — Telehealth: Payer: Self-pay | Admitting: *Deleted

## 2013-09-27 NOTE — Telephone Encounter (Signed)
Patient wife called and wanted to discuss husband visit but I do not see a DPR so could not discuss patient's OV I did advise they could come an sign a release.FYI

## 2013-10-13 DIAGNOSIS — H348192 Central retinal vein occlusion, unspecified eye, stable: Secondary | ICD-10-CM | POA: Diagnosis not present

## 2013-11-01 ENCOUNTER — Other Ambulatory Visit (INDEPENDENT_AMBULATORY_CARE_PROVIDER_SITE_OTHER): Payer: Medicare Other

## 2013-11-01 DIAGNOSIS — E119 Type 2 diabetes mellitus without complications: Secondary | ICD-10-CM

## 2013-11-01 LAB — COMPREHENSIVE METABOLIC PANEL
ALT: 16 U/L (ref 0–53)
AST: 22 U/L (ref 0–37)
Albumin: 3.9 g/dL (ref 3.5–5.2)
Alkaline Phosphatase: 66 U/L (ref 39–117)
BUN: 12 mg/dL (ref 6–23)
Calcium: 9 mg/dL (ref 8.4–10.5)
Chloride: 105 mEq/L (ref 96–112)
Creatinine, Ser: 0.7 mg/dL (ref 0.4–1.5)
Potassium: 3.6 mEq/L (ref 3.5–5.1)
Sodium: 140 mEq/L (ref 135–145)

## 2013-11-01 LAB — LIPID PANEL
Total CHOL/HDL Ratio: 4
VLDL: 115.6 mg/dL — ABNORMAL HIGH (ref 0.0–40.0)

## 2013-11-03 LAB — HEMOGLOBIN A1C: Hgb A1c MFr Bld: 6.4 % (ref 4.6–6.5)

## 2013-11-05 ENCOUNTER — Encounter: Payer: Self-pay | Admitting: Internal Medicine

## 2013-11-07 NOTE — Telephone Encounter (Signed)
Mailed unread message to pt  

## 2013-11-14 NOTE — Telephone Encounter (Signed)
Patient called and apologized patient has been on trip received letter and message when he arrived home and called to state he was fasting at time of blood work.

## 2013-11-21 ENCOUNTER — Telehealth: Payer: Self-pay | Admitting: Internal Medicine

## 2013-11-21 NOTE — Telephone Encounter (Signed)
Patient came into office wanted to speak with you concerning triglycerides explained to patient you were in with patient all day. Patient has tried and does not want to  start fenofibrate until for he can have office visit with you to discuss.

## 2013-11-21 NOTE — Telephone Encounter (Signed)
Wrong message opened.

## 2013-12-04 ENCOUNTER — Ambulatory Visit (INDEPENDENT_AMBULATORY_CARE_PROVIDER_SITE_OTHER): Payer: Medicare Other | Admitting: Internal Medicine

## 2013-12-04 ENCOUNTER — Encounter: Payer: Self-pay | Admitting: Internal Medicine

## 2013-12-04 VITALS — BP 122/68 | HR 81 | Temp 97.9°F | Ht 67.0 in | Wt 138.4 lb

## 2013-12-04 DIAGNOSIS — R4189 Other symptoms and signs involving cognitive functions and awareness: Secondary | ICD-10-CM | POA: Diagnosis not present

## 2013-12-04 DIAGNOSIS — E119 Type 2 diabetes mellitus without complications: Secondary | ICD-10-CM | POA: Diagnosis not present

## 2013-12-04 DIAGNOSIS — E785 Hyperlipidemia, unspecified: Secondary | ICD-10-CM

## 2013-12-04 MED ORDER — ZOSTER VACCINE LIVE 19400 UNT/0.65ML ~~LOC~~ SOLR: 0.6500 mL | Freq: Once | SUBCUTANEOUS | Status: DC

## 2013-12-04 MED ORDER — TETANUS-DIPHTH-ACELL PERTUSSIS 5-2.5-18.5 LF-MCG/0.5 IM SUSP: 0.5000 mL | Freq: Once | INTRAMUSCULAR | Status: DC

## 2013-12-04 NOTE — Progress Notes (Signed)
Patient ID: Hector Lucero, male   DOB: [birth year], 80 y.o.   MRN: [id number]  Patient Active Problem List   Diagnosis Date Noted  . Cognitive changes 06/23/2013  . Routine general medical examination at a health care facility 06/23/2013  . Fatigue 05/04/2012  . Diabetes mellitus type 2, diet-controlled 12/24/2011  . Hyperlipidemia LDL goal < 70 12/24/2011  . Tachycardia 07/30/2011  . Inguinal hernia 06/30/2011    Subjective:  CC:   Chief Complaint  Patient presents with  . Follow-up    discuss treatment for triglycerides    HPI:   Hector Lucero a 80 y.o. male who presents for follow up on diabetes mellitus, type 2, cognitive changes, and hyperlipidemia.  Recent fasting labs were significant for moderate hypertriglyceridemia of > 500 . He has been taking red yeast rice for hypertriglyceridemia, denies excessive alcohol intake (never > 1 glass of wine daily), has a healthy low cholesterol diet, and exercises daily.   He checks his diabetes rarely,  With fastings always around 100 .   He is accompanied by wife today who reports that he frequently wakes up disoriented in the late evening before going to bed.  He becomes argumentative but not combative.  the mental status changes last the rest of the evening .  Patient denies the symptoms but wife is adamant that it is happening.   She is also requesting TDap and Zostavax, he never got them after being given rx's last time.     Past Medical History  Diagnosis Date  . Radial fracture 2006    right, screws and plates  . Ulnar fracture 1995    fall off of ladder  . Hypertension     Past Surgical History  Procedure Laterality Date  . Hernia repair  Aug 2012    right indirect inguinal, with mesh       The following portions of the patient's history were reviewed and updated as appropriate: Allergies, current medications, and problem list.    Review of Systems:   12 Pt  review of systems was negative except those  addressed in the HPI,     History   Social History  . Marital Status: Married    Spouse Name: N/A    Number of Children: N/A  . Years of Education: N/A   Occupational History  . Retired     Energy manager.   Social History Main Topics  . Smoking status: Never Smoker   . Smokeless tobacco: Never Used  . Alcohol Use: 1.8 oz/week    3 Glasses of wine per week     Comment: occasional  . Drug Use: No  . Sexual Activity: Not Currently   Other Topics Concern  . Not on file   Social History Narrative   Exercises regularly 5 days weekly at the [facility]    Objective:  Filed Vitals:   12/04/13 1448  BP: 122/68  Pulse: 81  Temp: 97.9 F (36.6 C)     General appearance: alert, cooperative and appears stated age Ears: normal TM's and external ear canals both ears Throat: lips, mucosa, and tongue normal; teeth and gums normal Neck: no adenopathy, no carotid bruit, supple, symmetrical, trachea midline and thyroid not enlarged, symmetric, no tenderness/mass/nodules Back: symmetric, no curvature. ROM normal. No CVA tenderness. Lungs: clear to auscultation bilaterally Heart: regular rate and rhythm, S1, S2 normal, no murmur, click, rub or gallop Abdomen: soft, non-tender; bowel sounds normal; no masses,  no  organomegaly Pulses: 2+ and symmetric Skin: Skin color, texture, turgor normal. No rashes or lesions Lymph nodes: Cervical, supraclavicular, and axillary nodes normal. Foot exam:  Nails are well trimmed,  No callouses,  Sensation intact to microfilament   Assessment and Plan:  Hyperlipidemia LDL goal < 70 Managed with red yeast rice.  Triglycerides were elevated because he was not fasting .  Repeat lipids were more congruent with past levels. .  Lab Results  Component Value Date   CHOL 231* 12/05/2013   HDL 76.10 12/05/2013   LDLCALC 107* 04/18/2012   LDLDIRECT 131.2 12/05/2013   TRIG 119.0 12/05/2013   CHOLHDL 3 12/05/2013    Lab Results   Component Value Date   ALT 16 11/01/2013   AST 22 11/01/2013   ALKPHOS 66 11/01/2013   BILITOT 0.4 11/01/2013     Cognitive changes He has MCI and his new symptom are concerning for sundowning versus  vs hypoxemia induced delirium.  Overnight sleep study has again been ffered but declined,  Thyroid syphilis screen and B12 were normal. He is not interested in further workup at this time.     Diabetes mellitus type 2, diet-controlled Well-controlled on diet alone .  hemoglobin A1c has been consistently less than 7.0 . Patient is up-to-date on eye exams and foot exam was nomral today.  There is  no proteinuria on today's micro urinalysis .  Fasting lipids have been repeated and the hypertriglyceridemia seen earlier was due to nonfasting state. contineu red yeast rice. CAD .  Lab Results  Component Value Date   HGBA1C 6.4 11/01/2013   Lab Results  Component Value Date   MICROALBUR 0.8 06/22/2013      Updated Medication List Outpatient Encounter Prescriptions as of 12/04/2013  Medication Sig  . aspirin 81 MG tablet Take 81 mg by mouth daily.    Marland Kitchen CARDIZEM LA 120 MG 24 hr tablet Take 1 tablet by mouth 2 (two) times daily.  Marland Kitchen diltiazem (CARDIZEM) 30 MG tablet Take 30 mg by mouth 4 (four) times daily.  . fish oil-omega-3 fatty acids 1000 MG capsule Take 1,000 mg by mouth daily.    . Red Yeast Rice Extract ([employer] RED YEAST RICE) 600 MG CAPS Take 1 capsule (600 mg total) by mouth 2 (two) times daily.  . Tdap (BOOSTRIX) 5-2.5-18.5 LF-MCG/0.5 injection Inject 0.5 mLs into the muscle once.  . zoster vaccine live, PF, (ZOSTAVAX) 71062 UNT/0.65ML injection Inject 19,400 Units into the skin once.  . [DISCONTINUED] metoprolol succinate (TOPROL-XL) 25 MG 24 hr tablet Take 1 tablet (25 mg total) by mouth daily.  . [DISCONTINUED] Multiple Vitamins-Minerals (CENTRUM SILVER PO) Take 1 tablet by mouth daily.    . [DISCONTINUED] TDaP (BOOSTRIX) 5-2.5-18.5 LF-MCG/0.5 injection Inject 0.5 mL into the  muscle once.  . [DISCONTINUED] zoster vaccine live, PF, (ZOSTAVAX) [id number] UNT/0.65ML injection Inject 19,400 Units into the skin once.

## 2013-12-04 NOTE — Patient Instructions (Addendum)
Return for a repeat fasting lipid panel so we can recheck triglycerides.  If still > 300 ,  I will recommend either gemfibrozil, taken twice daily.  Or fenofibrate taken once daily   Your diabetes remains under excellent control with diet alone   The night time altered mental status may be due to sundowning,m  Or due to sleep apnea  We should consider working it up in the future

## 2013-12-04 NOTE — Progress Notes (Signed)
Pre visit review using our clinic review tool, if applicable. No additional management support is needed unless otherwise documented below in the visit note. 

## 2013-12-04 NOTE — Assessment & Plan Note (Addendum)
Managed with red yeast rice.  Triglycerides were elevated because he was not fasting .  Repeat lipids were more congruent with past levels. .  Lab Results  Component Value Date   CHOL 231* 12/05/2013   HDL 76.10 12/05/2013   LDLCALC 107* 04/18/2012   LDLDIRECT 131.2 12/05/2013   TRIG 119.0 12/05/2013   CHOLHDL 3 12/05/2013    Lab Results  Component Value Date   ALT 16 11/01/2013   AST 22 11/01/2013   ALKPHOS 66 11/01/2013   BILITOT 0.4 11/01/2013

## 2013-12-05 ENCOUNTER — Encounter: Payer: Self-pay | Admitting: Internal Medicine

## 2013-12-05 ENCOUNTER — Telehealth: Payer: Self-pay | Admitting: *Deleted

## 2013-12-05 ENCOUNTER — Other Ambulatory Visit (INDEPENDENT_AMBULATORY_CARE_PROVIDER_SITE_OTHER): Payer: Medicare Other

## 2013-12-05 DIAGNOSIS — E785 Hyperlipidemia, unspecified: Secondary | ICD-10-CM | POA: Diagnosis not present

## 2013-12-05 LAB — LDL CHOLESTEROL, DIRECT: LDL DIRECT: 131.2 mg/dL

## 2013-12-05 LAB — LIPID PANEL
Cholesterol: 231 mg/dL — ABNORMAL HIGH (ref 0–200)
HDL: 76.1 mg/dL (ref >39.00)
Total CHOL/HDL Ratio: 3
Triglycerides: 119 mg/dL (ref 0.0–149.0)
VLDL: 23.8 mg/dL (ref 0.0–40.0)

## 2013-12-05 NOTE — Assessment & Plan Note (Signed)
Well-controlled on diet alone .  hemoglobin A1c has been consistently less than 7.0 . Patient is up-to-date on eye exams and foot exam was nomral today.  There is  no proteinuria on today's micro urinalysis .  Fasting lipids have been repeated and the hypertriglyceridemia seen earlier was due to nonfasting state. contineu red yeast rice. CAD .  Lab Results  Component Value Date   HGBA1C 6.4 11/01/2013   Lab Results  Component Value Date   MICROALBUR 0.8 06/22/2013

## 2013-12-05 NOTE — Telephone Encounter (Signed)
What labs and dx?  

## 2013-12-05 NOTE — Assessment & Plan Note (Signed)
He has MCI and his new symptom are concerning for sundowning versus  vs hypoxemia induced delirium.  Overnight sleep study has again been ffered but declined,  Thyroid syphilis screen and B12 were normal. He is not interested in further workup at this time.

## 2013-12-06 ENCOUNTER — Encounter: Payer: Self-pay | Admitting: Cardiovascular Disease

## 2013-12-06 ENCOUNTER — Ambulatory Visit (INDEPENDENT_AMBULATORY_CARE_PROVIDER_SITE_OTHER): Payer: Medicare Other | Admitting: Cardiovascular Disease

## 2013-12-06 VITALS — BP 118/66 | HR 89 | Ht 68.0 in | Wt 140.2 lb

## 2013-12-06 DIAGNOSIS — E785 Hyperlipidemia, unspecified: Secondary | ICD-10-CM | POA: Diagnosis not present

## 2013-12-06 DIAGNOSIS — R000 Tachycardia, unspecified: Secondary | ICD-10-CM | POA: Diagnosis not present

## 2013-12-06 NOTE — Patient Instructions (Signed)
You are doing well. No medication changes were made.  Please call us if you have new issues that need to be addressed before your next appt.  Your physician wants you to follow-up in: 12 months.  You will receive a reminder letter in the mail two months in advance. If you don't receive a letter, please call our office to schedule the follow-up appointment. 

## 2013-12-06 NOTE — Assessment & Plan Note (Signed)
Is not interested in taking a statin. No medication changes made

## 2013-12-06 NOTE — Progress Notes (Signed)
Patient ID: Hector Lucero, male    DOB: 06/05/1928, 80 y.o.   MRN: [id number]  HPI Comments: 59 and yo white male, patient of Dr. Derrel Nip,  with a hx of  Hernia, s/p surgical repair,  noted to be tachycardic during his preoperative evaluation,   sent to ER, noted to have a narrow complex tachycardia with rates in the 160's, given IV metoprolol with acute conversion of his rate to the 70s , sent home with  metoprolol succinate 25 mg daily. He reports that he had an uneventful repair of indirect right inguinal hernia, on Aug 27 by Job Founds.   Holter monitor x48 hours at that time showed 27 episodes of SVT or atrial tachycardia, the longest episode lasting 36 beats. He was asymptomatic   he is very active at [employer] [place], no complaints. He does water aerobics several times per week  His wife reports that he is doing well. He denies any tachycardia. Denies any palpitations or tachycardia. Doing well on the medications, taking diltiazem 30 mg twice a day  He does not want a cholesterol medication Previous triglycerides in the low 100s, one blood draw showing triglycerides greater than 500 with repeat showing again triglycerides in the low 100s  EKG today shows normal sinus rhythm with rate 89 beats per minute with no significant ST or T wave changes  Outpatient Encounter Prescriptions as of 12/06/2013  Medication Sig  . aspirin 81 MG tablet Take 81 mg by mouth daily.    Marland Kitchen diltiazem (CARDIZEM) 30 MG tablet Take 30 mg by mouth 2 (two) times daily.   . fish oil-omega-3 fatty acids 1000 MG capsule Take 1,000 mg by mouth daily.    . Red Yeast Rice Extract ([employer]S RED YEAST RICE) 600 MG CAPS Take 1 capsule (600 mg total) by mouth 2 (two) times daily.   Review of Systems  Constitutional: Negative.   HENT: Negative.   Eyes: Negative.   Respiratory: Negative.   Cardiovascular: Negative.   Gastrointestinal: Negative.   Endocrine: Negative.   Musculoskeletal: Negative.   Skin: Negative.    Allergic/Immunologic: Negative.   Neurological: Negative.   Hematological: Negative.   Psychiatric/Behavioral: Negative.   All other systems reviewed and are negative.    BP 118/66  Pulse 89  Ht 5\' 8"  (1.727 m)  Wt 140 lb 4 oz (63.617 kg)  BMI 21.33 kg/m2  Physical Exam  Nursing note and vitals reviewed. Constitutional: He is oriented to person, place, and time. He appears well-developed and well-nourished.  HENT:  Head: Normocephalic.  Nose: Nose normal.  Mouth/Throat: Oropharynx is clear and moist.  Eyes: Conjunctivae are normal. Pupils are equal, round, and reactive to light.  Neck: Normal range of motion. Neck supple. No JVD present.  Cardiovascular: Normal rate, regular rhythm, S1 normal, S2 normal, normal heart sounds and intact distal pulses.  Exam reveals no gallop and no friction rub.   No murmur heard. Pulmonary/Chest: Effort normal and breath sounds normal. No respiratory distress. He has no wheezes. He has no rales. He exhibits no tenderness.  Abdominal: Soft. Bowel sounds are normal. He exhibits no distension. There is no tenderness.  Musculoskeletal: Normal range of motion. He exhibits no edema and no tenderness.  Lymphadenopathy:    He has no cervical adenopathy.  Neurological: He is alert and oriented to person, place, and time. Coordination normal.  Skin: Skin is warm and dry. No rash noted. No erythema.  Psychiatric: He has a normal mood and affect. His behavior  is normal. Judgment and thought content normal.      Assessment and Plan

## 2013-12-06 NOTE — Assessment & Plan Note (Addendum)
He denies having any further episodes of tachycardia. He is happy with his diltiazem, two small doses daily

## 2013-12-07 ENCOUNTER — Encounter: Payer: Self-pay | Admitting: Internal Medicine

## 2013-12-11 NOTE — Telephone Encounter (Signed)
Mailed unread message to pt  

## 2013-12-19 DIAGNOSIS — C61 Malignant neoplasm of prostate: Secondary | ICD-10-CM | POA: Diagnosis not present

## 2013-12-22 DIAGNOSIS — H348192 Central retinal vein occlusion, unspecified eye, stable: Secondary | ICD-10-CM | POA: Diagnosis not present

## 2014-02-26 DIAGNOSIS — H348192 Central retinal vein occlusion, unspecified eye, stable: Secondary | ICD-10-CM | POA: Diagnosis not present

## 2014-02-26 DIAGNOSIS — H35359 Cystoid macular degeneration, unspecified eye: Secondary | ICD-10-CM | POA: Diagnosis not present

## 2014-03-07 ENCOUNTER — Encounter: Payer: Self-pay | Admitting: Internal Medicine

## 2014-03-07 ENCOUNTER — Ambulatory Visit (INDEPENDENT_AMBULATORY_CARE_PROVIDER_SITE_OTHER): Payer: Medicare Other | Admitting: Internal Medicine

## 2014-03-07 VITALS — BP 128/68 | HR 77 | Temp 97.8°F | Resp 16 | Wt 139.0 lb

## 2014-03-07 DIAGNOSIS — R0683 Snoring: Secondary | ICD-10-CM

## 2014-03-07 DIAGNOSIS — G3184 Mild cognitive impairment, so stated: Secondary | ICD-10-CM | POA: Diagnosis not present

## 2014-03-07 DIAGNOSIS — G4710 Hypersomnia, unspecified: Secondary | ICD-10-CM | POA: Diagnosis not present

## 2014-03-07 DIAGNOSIS — R0609 Other forms of dyspnea: Secondary | ICD-10-CM | POA: Diagnosis not present

## 2014-03-07 DIAGNOSIS — R4189 Other symptoms and signs involving cognitive functions and awareness: Secondary | ICD-10-CM

## 2014-03-07 DIAGNOSIS — R413 Other amnesia: Secondary | ICD-10-CM

## 2014-03-07 DIAGNOSIS — E1159 Type 2 diabetes mellitus with other circulatory complications: Secondary | ICD-10-CM | POA: Diagnosis not present

## 2014-03-07 DIAGNOSIS — R0989 Other specified symptoms and signs involving the circulatory and respiratory systems: Secondary | ICD-10-CM | POA: Diagnosis not present

## 2014-03-07 DIAGNOSIS — E119 Type 2 diabetes mellitus without complications: Secondary | ICD-10-CM

## 2014-03-07 DIAGNOSIS — E785 Hyperlipidemia, unspecified: Secondary | ICD-10-CM

## 2014-03-07 DIAGNOSIS — C61 Malignant neoplasm of prostate: Secondary | ICD-10-CM

## 2014-03-07 LAB — HM DIABETES FOOT EXAM: HM Diabetic Foot Exam: NORMAL

## 2014-03-07 LAB — COMPREHENSIVE METABOLIC PANEL
ALBUMIN: 3.9 g/dL (ref 3.5–5.2)
ALT: 19 U/L (ref 0–53)
AST: 25 U/L (ref 0–37)
Alkaline Phosphatase: 63 U/L (ref 39–117)
BUN: 16 mg/dL (ref 6–23)
CO2: 28 mEq/L (ref 19–32)
Calcium: 9.4 mg/dL (ref 8.4–10.5)
Chloride: 102 mEq/L (ref 96–112)
Creatinine, Ser: 0.7 mg/dL (ref 0.4–1.5)
GFR: 110.49 mL/min (ref >60.00)
GLUCOSE: 105 mg/dL — AB (ref 70–99)
POTASSIUM: 4.1 meq/L (ref 3.5–5.1)
SODIUM: 137 meq/L (ref 135–145)
Total Bilirubin: 1 mg/dL (ref 0.3–1.2)
Total Protein: 6.6 g/dL (ref 6.0–8.3)

## 2014-03-07 LAB — HEMOGLOBIN A1C: Hgb A1c MFr Bld: 6 % (ref 4.6–6.5)

## 2014-03-07 LAB — MICROALBUMIN / CREATININE URINE RATIO
Creatinine,U: 82.8 mg/dL
Microalb Creat Ratio: 1.6 mg/g (ref 0.0–30.0)
Microalb, Ur: 1.3 mg/dL (ref 0.0–1.9)

## 2014-03-07 NOTE — Progress Notes (Signed)
Patient ID: Hector Lucero, male   DOB: 11-02-1928, 80 y.o.   MRN: [id number]  Patient Active Problem List   Diagnosis Date Noted  . Cancer of prostate w/low recurrence risk (T1-2a, Gleason<7 & PSA<10) 03/09/2014  . Cognitive changes 06/23/2013  . Routine general medical examination at a health care facility 06/23/2013  . Fatigue 05/04/2012  . Diabetes mellitus type 2, diet-controlled 12/24/2011  . Hyperlipidemia LDL goal < 70 12/24/2011  . Tachycardia 07/30/2011  . Inguinal hernia 06/30/2011    Subjective:  CC:   Chief Complaint  Patient presents with  . Follow-up  . Diabetes    HPI:   Hector ALKHATIB is a 80 y.o. male who presents for 3 month follow up on diet controlled, DM, hyperlipidemia,  And MCI. His wife has accompanied  him today. She is concerned about his recurrent episodes of confusion that occur after falling asleep on the couch.  The confusion persists for hours and is embarrassing to her when they go out to dinner with friends.  He is not aware of the confusion and  has refused further workup thus far but based on today's discussion is willing to have further workup.    He has been taking red yeast rice for hypertriglyceridemia, denies excessive alcohol intake (never > 1 glass of wine daily), has a healthy low cholesterol diet, and exercises daily.   She is also requesting confirmation of whether he received TDap and Zostavax , which it appears he has not had     Past Medical History  Diagnosis Date  . Radial fracture 2006    right, screws and plates  . Ulnar fracture 1995    fall off of ladder  . Hypertension     Past Surgical History  Procedure Laterality Date  . Hernia repair  Aug 2012    right indirect inguinal, with mesh       The following portions of the patient's history were reviewed and updated as appropriate: Allergies, current medications, and problem list.    Review of Systems:   Patient denies headache, fevers, malaise,  unintentional weight loss, skin rash, eye pain, sinus congestion and sinus pain, sore throat, dysphagia,  hemoptysis , cough, dyspnea, wheezing, chest pain, palpitations, orthopnea, edema, abdominal pain, nausea, melena, diarrhea, constipation, flank pain, dysuria, hematuria, urinary  Frequency, nocturia, numbness, tingling, seizures,  Focal weakness, Loss of consciousness,  Tremor, insomnia, depression, anxiety, and suicidal ideation.     History   Social History  . Marital Status: Married    Spouse Name: N/A    Number of Children: N/A  . Years of Education: N/A   Occupational History  . Retired     Energy manager.   Social History Main Topics  . Smoking status: Never Smoker   . Smokeless tobacco: Never Used  . Alcohol Use: 1.8 oz/week    3 Glasses of wine per week     Comment: occasional  . Drug Use: No  . Sexual Activity: Not Currently   Other Topics Concern  . Not on file   Social History Narrative   Exercises regularly 5 days weekly at the [facility]    Objective:  Filed Vitals:   03/07/14 1034  BP: 128/68  Pulse: 77  Temp: 97.8 F (36.6 C)  Resp: 16     General appearance: alert, cooperative and appears stated age Ears: normal TM's and external ear canals both ears Throat: lips, mucosa, and tongue normal; teeth and gums normal  Neck: no adenopathy, no carotid bruit, supple, symmetrical, trachea midline and thyroid not enlarged, symmetric, no tenderness/mass/nodules Back: symmetric, no curvature. ROM normal. No CVA tenderness. Lungs: clear to auscultation bilaterally Heart: regular rate and rhythm, S1, S2 normal, no murmur, click, rub or gallop Abdomen: soft, non-tender; bowel sounds normal; no masses,  no organomegaly Pulses: 2+ and symmetric Skin: Skin color, texture, turgor normal. No rashes or lesions Lymph nodes: Cervical, supraclavicular, and axillary nodes normal.  Assessment and Plan:  Cancer of prostate w/low recurrence  risk (T1-2a, Gleason<7 & PSA<10) Last seen by Dr Bernardo Heater in November,  Watchful waiting.   Diabetes mellitus type 2, diet-controlled Well-controlled on diet alone .  hemoglobin A1c has been consistently less than 7.0 . Patient is up-to-date on eye exams and foot exam was nomral today.  There is  no proteinuria on today's micro urinalysis .  Fasting lipids have been repeated and the hypertriglyceridemia seen earlier was due to nonfasting state. contineu red yeast rice. CAD .  Lab Results  Component Value Date   HGBA1C 6.0 03/07/2014   Lab Results  Component Value Date   MICROALBUR 1.3 03/07/2014       Hyperlipidemia LDL goal < 70 Managed with red yeast rice. Given his age and TC/HC ratio,78  No statin advised.   Lab Results  Component Value Date   CHOL 231* 12/05/2013   HDL 76.10 12/05/2013   LDLCALC 107* 04/18/2012   LDLDIRECT 131.2 12/05/2013   TRIG 119.0 12/05/2013   CHOLHDL 3 12/05/2013    Lab Results  Component Value Date   ALT 19 03/07/2014   AST 25 03/07/2014   ALKPHOS 63 03/07/2014   BILITOT 1.0 03/07/2014       Cognitive changes Sleep study ordered to rule out OSA as a contributor to his MS changes upon wakening.  If normal, will recommend trial of aricept.    Updated Medication List Outpatient Encounter Prescriptions as of 03/07/2014  Medication Sig  . aspirin 81 MG tablet Take 81 mg by mouth daily.    Marland Kitchen diltiazem (CARDIZEM) 30 MG tablet Take 30 mg by mouth 2 (two) times daily.   . fish oil-omega-3 fatty acids 1000 MG capsule Take 1,000 mg by mouth daily.    . Red Yeast Rice Extract ([employer]S RED YEAST RICE) 600 MG CAPS Take 1 capsule (600 mg total) by mouth 2 (two) times daily.  . Tdap (BOOSTRIX) 5-2.5-18.5 LF-MCG/0.5 injection Inject 0.5 mLs into the muscle once.  . zoster vaccine live, PF, (ZOSTAVAX) 19147 UNT/0.65ML injection Inject 19,400 Units into the skin once.     Orders Placed This Encounter  Procedures  . Hemoglobin A1c  . Microalbumin / creatinine urine  ratio  . Comprehensive metabolic panel  . Ambulatory referral to Sleep Studies  . HM DIABETES FOOT EXAM    No Follow-up on file.

## 2014-03-07 NOTE — Progress Notes (Signed)
Pre-visit discussion using our clinic review tool. No additional management support is needed unless otherwise documented below in the visit note.  

## 2014-03-07 NOTE — Patient Instructions (Signed)
Hector Lucero WILL CALL YOU WITH THE APPT FOR THE SLEEP STUDY  YOU CAN REQUEST A MEDICATION FROM ME IN ADVANCE TO HELP YOU REST THAT NIGHT OF THE STUDY

## 2014-03-08 ENCOUNTER — Telehealth: Payer: Self-pay

## 2014-03-08 NOTE — Telephone Encounter (Signed)
Relevant patient education assigned to patient using Emmi. ° °

## 2014-03-09 DIAGNOSIS — C61 Malignant neoplasm of prostate: Secondary | ICD-10-CM | POA: Insufficient documentation

## 2014-03-09 MED ORDER — TETANUS-DIPHTH-ACELL PERTUSSIS 5-2.5-18.5 LF-MCG/0.5 IM SUSP: 0.5000 mL | Freq: Once | INTRAMUSCULAR | Status: DC

## 2014-03-09 MED ORDER — ZOSTER VACCINE LIVE 19400 UNT/0.65ML ~~LOC~~ SOLR: 0.6500 mL | Freq: Once | SUBCUTANEOUS | Status: DC

## 2014-03-09 NOTE — Assessment & Plan Note (Signed)
Well-controlled on diet alone .  hemoglobin A1c has been consistently less than 7.0 . Patient is up-to-date on eye exams and foot exam was nomral today.  There is  no proteinuria on today's micro urinalysis .  Fasting lipids have been repeated and the hypertriglyceridemia seen earlier was due to nonfasting state. contineu red yeast rice. CAD .  Lab Results  Component Value Date   HGBA1C 6.0 03/07/2014   Lab Results  Component Value Date   MICROALBUR 1.3 03/07/2014

## 2014-03-09 NOTE — Assessment & Plan Note (Addendum)
Managed with red yeast rice. Given his age and TC/HC ratio,  No statin advised.   Lab Results  Component Value Date   CHOL 231* 12/05/2013   HDL 76.10 12/05/2013   LDLCALC 107* 04/18/2012   LDLDIRECT 131.2 12/05/2013   TRIG 119.0 12/05/2013   CHOLHDL 3 12/05/2013    Lab Results  Component Value Date   ALT 19 03/07/2014   AST 25 03/07/2014   ALKPHOS 63 03/07/2014   BILITOT 1.0 03/07/2014

## 2014-03-09 NOTE — Assessment & Plan Note (Signed)
Sleep study ordered to rule out OSA as a contributor to his MS changes upon wakening.  If normal, will recommend trial of aricept.

## 2014-03-09 NOTE — Assessment & Plan Note (Signed)
Last seen by Dr Bernardo Heater in November,  Watchful waiting.

## 2014-03-10 ENCOUNTER — Encounter: Payer: Self-pay | Admitting: Internal Medicine

## 2014-03-22 NOTE — Telephone Encounter (Signed)
Mailed unread message to pt  

## 2014-04-30 DIAGNOSIS — H348192 Central retinal vein occlusion, unspecified eye, stable: Secondary | ICD-10-CM | POA: Diagnosis not present

## 2014-05-04 DIAGNOSIS — H04209 Unspecified epiphora, unspecified lacrimal gland: Secondary | ICD-10-CM | POA: Diagnosis not present

## 2014-05-14 ENCOUNTER — Emergency Department: Payer: Self-pay | Admitting: Emergency Medicine

## 2014-05-14 DIAGNOSIS — Z7982 Long term (current) use of aspirin: Secondary | ICD-10-CM | POA: Diagnosis not present

## 2014-05-14 DIAGNOSIS — E119 Type 2 diabetes mellitus without complications: Secondary | ICD-10-CM | POA: Diagnosis not present

## 2014-05-14 DIAGNOSIS — Z9889 Other specified postprocedural states: Secondary | ICD-10-CM | POA: Diagnosis not present

## 2014-05-14 DIAGNOSIS — R42 Dizziness and giddiness: Secondary | ICD-10-CM | POA: Diagnosis not present

## 2014-05-14 LAB — CBC WITH DIFFERENTIAL/PLATELET
BASOS PCT: 0.9 %
Basophil #: 0 10*3/uL (ref 0.0–0.1)
Eosinophil #: 0.1 10*3/uL (ref 0.0–0.7)
Eosinophil %: 2.8 %
HCT: 42.1 % (ref 40.0–52.0)
HGB: 14.2 g/dL (ref 13.0–18.0)
LYMPHS ABS: 1.1 10*3/uL (ref 1.0–3.6)
LYMPHS PCT: 25.7 %
MCH: 31.9 pg (ref 26.0–34.0)
MCHC: 33.8 g/dL (ref 32.0–36.0)
MCV: 94 fL (ref 80–100)
Monocyte #: 0.2 x10 3/mm (ref 0.2–1.0)
Monocyte %: 4.8 %
NEUTROS PCT: 65.8 %
Neutrophil #: 2.9 10*3/uL (ref 1.4–6.5)
Platelet: 226 10*3/uL (ref 150–440)
RBC: 4.46 10*6/uL (ref 4.40–5.90)
RDW: 13.2 % (ref 11.5–14.5)
WBC: 4.4 10*3/uL (ref 3.8–10.6)

## 2014-05-14 LAB — URINALYSIS, COMPLETE
Bacteria: NEGATIVE
Bilirubin,UR: NEGATIVE
Blood: NEGATIVE
Glucose,UR: NEGATIVE mg/dL (ref 0–75)
KETONE: NEGATIVE
LEUKOCYTE ESTERASE: NEGATIVE
Nitrite: NEGATIVE
Ph: 7 (ref 4.5–8.0)
Protein: NEGATIVE
RBC,UR: NONE SEEN /HPF (ref 0–5)
Specific Gravity: 1.011 (ref 1.003–1.030)
WBC UR: NONE SEEN /HPF (ref 0–5)

## 2014-05-14 LAB — BASIC METABOLIC PANEL
Anion Gap: 7 (ref 7–16)
BUN: 12 mg/dL (ref 7–18)
Calcium, Total: 8.2 mg/dL — ABNORMAL LOW (ref 8.5–10.1)
Chloride: 108 mmol/L — ABNORMAL HIGH (ref 98–107)
Co2: 27 mmol/L (ref 21–32)
Creatinine: 0.88 mg/dL (ref 0.60–1.30)
EGFR (African American): >60
Glucose: 137 mg/dL — ABNORMAL HIGH (ref 65–99)
OSMOLALITY: 285 (ref 275–301)
Potassium: 3.5 mmol/L (ref 3.5–5.1)
Sodium: 142 mmol/L (ref 136–145)

## 2014-05-14 LAB — TROPONIN I: Troponin-I: <0.02 ng/mL

## 2014-05-18 ENCOUNTER — Ambulatory Visit: Payer: Self-pay | Admitting: Internal Medicine

## 2014-05-18 DIAGNOSIS — G4761 Periodic limb movement disorder: Secondary | ICD-10-CM | POA: Diagnosis not present

## 2014-05-18 DIAGNOSIS — G4733 Obstructive sleep apnea (adult) (pediatric): Secondary | ICD-10-CM | POA: Diagnosis not present

## 2014-05-18 DIAGNOSIS — R413 Other amnesia: Secondary | ICD-10-CM | POA: Diagnosis not present

## 2014-05-18 DIAGNOSIS — R0989 Other specified symptoms and signs involving the circulatory and respiratory systems: Secondary | ICD-10-CM | POA: Diagnosis not present

## 2014-05-18 DIAGNOSIS — R0609 Other forms of dyspnea: Secondary | ICD-10-CM | POA: Diagnosis not present

## 2014-05-30 ENCOUNTER — Telehealth: Payer: Self-pay | Admitting: Internal Medicine

## 2014-05-30 DIAGNOSIS — G4733 Obstructive sleep apnea (adult) (pediatric): Secondary | ICD-10-CM

## 2014-05-30 NOTE — Telephone Encounter (Signed)
His sleep study was positive for Moderately severe sleep apnea.  He needs to have a second study to titrate the CPAP .  He will be contacted

## 2014-05-30 NOTE — Telephone Encounter (Signed)
Left message for patient to return call to office. 

## 2014-06-01 ENCOUNTER — Encounter: Payer: Self-pay | Admitting: *Deleted

## 2014-06-01 NOTE — Telephone Encounter (Signed)
Letter mailed after several attempts to call patient.

## 2014-06-06 ENCOUNTER — Encounter: Payer: Self-pay | Admitting: Internal Medicine

## 2014-06-12 ENCOUNTER — Ambulatory Visit: Payer: Self-pay | Admitting: Internal Medicine

## 2014-06-12 DIAGNOSIS — G4733 Obstructive sleep apnea (adult) (pediatric): Secondary | ICD-10-CM | POA: Diagnosis not present

## 2014-06-12 DIAGNOSIS — R0609 Other forms of dyspnea: Secondary | ICD-10-CM | POA: Diagnosis not present

## 2014-06-12 DIAGNOSIS — R0989 Other specified symptoms and signs involving the circulatory and respiratory systems: Secondary | ICD-10-CM | POA: Diagnosis not present

## 2014-06-21 ENCOUNTER — Telehealth: Payer: Self-pay | Admitting: Internal Medicine

## 2014-06-21 DIAGNOSIS — G4733 Obstructive sleep apnea (adult) (pediatric): Secondary | ICD-10-CM

## 2014-06-21 NOTE — Assessment & Plan Note (Signed)
CPAP titration study was done and optimal pressure was 6 cm H20, although he did not tolerate CPAP very well

## 2014-06-21 NOTE — Telephone Encounter (Signed)
CPAP titration study was done and optimal pressure was 6 cm H20, although he did not tolerate CPAP very well .  If he is willing to try to adjust to CPAP   We will order the mask and I can prescribe something to help him relax.

## 2014-06-22 NOTE — Telephone Encounter (Signed)
Left message for pt to return call.

## 2014-06-26 DIAGNOSIS — N400 Enlarged prostate without lower urinary tract symptoms: Secondary | ICD-10-CM | POA: Diagnosis not present

## 2014-06-26 DIAGNOSIS — Z86010 Personal history of colonic polyps: Secondary | ICD-10-CM | POA: Diagnosis not present

## 2014-06-26 DIAGNOSIS — I10 Essential (primary) hypertension: Secondary | ICD-10-CM | POA: Diagnosis not present

## 2014-06-26 DIAGNOSIS — C61 Malignant neoplasm of prostate: Secondary | ICD-10-CM | POA: Diagnosis not present

## 2014-06-26 DIAGNOSIS — N529 Male erectile dysfunction, unspecified: Secondary | ICD-10-CM | POA: Diagnosis not present

## 2014-06-26 NOTE — Telephone Encounter (Signed)
Patient has been out of town and before trying the CPAP would like to come in and discuss with MD has an appointment for 07/13/14.

## 2014-07-01 ENCOUNTER — Telehealth: Payer: Self-pay | Admitting: Internal Medicine

## 2014-07-01 DIAGNOSIS — C61 Malignant neoplasm of prostate: Secondary | ICD-10-CM

## 2014-07-11 DIAGNOSIS — H348192 Central retinal vein occlusion, unspecified eye, stable: Secondary | ICD-10-CM | POA: Diagnosis not present

## 2014-07-11 DIAGNOSIS — H35359 Cystoid macular degeneration, unspecified eye: Secondary | ICD-10-CM | POA: Diagnosis not present

## 2014-07-13 ENCOUNTER — Ambulatory Visit (INDEPENDENT_AMBULATORY_CARE_PROVIDER_SITE_OTHER): Payer: Medicare Other | Admitting: Internal Medicine

## 2014-07-13 ENCOUNTER — Encounter: Payer: Self-pay | Admitting: Internal Medicine

## 2014-07-13 VITALS — BP 138/90 | HR 89 | Temp 97.8°F | Resp 16 | Ht 68.5 in | Wt 139.0 lb

## 2014-07-13 DIAGNOSIS — G4733 Obstructive sleep apnea (adult) (pediatric): Secondary | ICD-10-CM

## 2014-07-13 DIAGNOSIS — R4189 Other symptoms and signs involving cognitive functions and awareness: Secondary | ICD-10-CM

## 2014-07-13 NOTE — Assessment & Plan Note (Addendum)
He has repeatedly declined workup,  And sleep study was abnormal with moderately severe OSA diagnosed and CPAP   Was deferred .  He continues to scor well on MMSE (27/30) . I suspect the untreated OSA is affecting his cognition.

## 2014-07-13 NOTE — Patient Instructions (Signed)
We will order your CPAP mask  If you have trouble adjusting to it ,  I will prescribe something to help you relax  Your sleep apnea is increasing your risk for sudden death and aggravating your heart  Return next week for fasting labs and pneumonai vaccine

## 2014-07-13 NOTE — Progress Notes (Signed)
Pre-visit discussion using our clinic review tool. No additional management support is needed unless otherwise documented below in the visit note.  

## 2014-07-13 NOTE — Progress Notes (Signed)
Patient ID: Hector Lucero, male   DOB: [birth year], 80 y.o.   MRN: [id number]   Patient Active Problem List   Diagnosis Date Noted  . OSA (obstructive sleep apnea) 05/30/2014  . Cancer of prostate w/low recurrence risk (T1-2a, Gleason<7 & PSA<10) 03/09/2014  . Cognitive changes 06/23/2013  . Routine general medical examination at a health care facility 06/23/2013  . Fatigue 05/04/2012  . Diabetes mellitus type 2, diet-controlled 12/24/2011  . Hyperlipidemia LDL goal < 70 12/24/2011  . Tachycardia 07/30/2011  . Inguinal hernia 06/30/2011    Subjective:  CC:   Chief Complaint  Patient presents with  . Follow-up    Discuss CPAP therapy    HPI:   Hector Lucero is a 80 y.o. male who presents for f u on multiple conditions including OSA untreated since diagnosis ,  Type 2 DM.  Tachycardia,  And cognitive changes. Sleeping well,  nocturia x 3 .  denies napping during gthe day but wife says he naps daily on the couch Wife is with him today states that his memory is much worse.  Episodes of  Not being able to find car and getting lost,,  Both occurred while visitingni [place].   recent ER visit for sudden onset diaphoresis.  Symptoms woke him from a nap. And he didn't  feel right.  Went to [facility] MMSE repeated today 27/30 clock drawing fine and naming fine    Past Medical History  Diagnosis Date  . Radial fracture 2006    right, screws and plates  . Ulnar fracture 1995    fall off of ladder  . Hypertension     Past Surgical History  Procedure Laterality Date  . Hernia repair  Aug 2012    right indirect inguinal, with mesh       The following portions of the patient's history were reviewed and updated as appropriate: Allergies, current medications, and problem list.    Review of Systems:   Patient denies headache, fevers, malaise, unintentional weight loss, skin rash, eye pain, sinus congestion and sinus pain, sore throat, dysphagia,  hemoptysis , cough,  dyspnea, wheezing, chest pain, palpitations, orthopnea, edema, abdominal pain, nausea, melena, diarrhea, constipation, flank pain, dysuria, hematuria, urinary  Frequency, nocturia, numbness, tingling, seizures,  Focal weakness, Loss of consciousness,  Tremor, insomnia, depression, anxiety, and suicidal ideation.     History   Social History  . Marital Status: Married    Spouse Name: N/A    Number of Children: N/A  . Years of Education: N/A   Occupational History  . Retired     Energy manager.   Social History Main Topics  . Smoking status: Never Smoker   . Smokeless tobacco: Never Used  . Alcohol Use: 1.8 oz/week    3 Glasses of wine per week     Comment: occasional  . Drug Use: No  . Sexual Activity: Not Currently   Other Topics Concern  . Not on file   Social History Narrative   Exercises regularly 5 days weekly at the [facility]    Objective:  Filed Vitals:   07/13/14 1142  BP: 138/90  Pulse: 89  Temp: 97.8 F (36.6 C)  Resp: 16     General appearance: alert, cooperative and appears stated age Ears: normal TM's and external ear canals both ears Throat: lips, mucosa, and tongue normal; teeth and gums normal Neck: no adenopathy, no carotid bruit, supple, symmetrical, trachea midline and thyroid not enlarged,  symmetric, no tenderness/mass/nodules Back: symmetric, no curvature. ROM normal. No CVA tenderness. Lungs: clear to auscultation bilaterally Heart: regular rate and rhythm, S1, S2 normal, no murmur, click, rub or gallop Abdomen: soft, non-tender; bowel sounds normal; no masses,  no organomegaly Pulses: 2+ and symmetric Skin: Skin color, texture, turgor normal. No rashes or lesions Lymph nodes: Cervical, supraclavicular, and axillary nodes normal. mini-mental status exam:  27/30  Assessment and Plan:  Cognitive changes He has repeatedly declined workup,  And sleep study was abnormal with moderately severe OSA diagnosed and CPAP    Was deferred .  He continues to scor well on MMSE (27/30) . I suspect the untreated OSA is affecting his cognition.   OSA (obstructive sleep apnea) diagnosed with prior sleep study but treatment has been deferred  by patient.  Discussed the long term history of OSA , the risks of long term damage to heart and the signs and symptoms attributable to OSA.  Advised patient to consider repeat trialof CPAP, which he is willing to do   A total of 40 minutes was spent with patient more than half of which was spent in counseling patient on the above mentioned issues , reviewing and explaining recent labs and imaging studies done, and coordination of care.  Updated Medication List Outpatient Encounter Prescriptions as of 07/13/2014  Medication Sig  . aspirin 81 MG tablet Take 81 mg by mouth daily.    Marland Kitchen diltiazem (CARDIZEM) 30 MG tablet Take 30 mg by mouth 2 (two) times daily.   . fish oil-omega-3 fatty acids 1000 MG capsule Take 1,000 mg by mouth daily.    . Red Yeast Rice Extract ([employer] RED YEAST RICE) 600 MG CAPS Take 1 capsule (600 mg total) by mouth 2 (two) times daily.  . [DISCONTINUED] Tdap (BOOSTRIX) 5-2.5-18.5 LF-MCG/0.5 injection Inject 0.5 mLs into the muscle once.  . [DISCONTINUED] zoster vaccine live, PF, (ZOSTAVAX) 83094 UNT/0.65ML injection Inject 19,400 Units into the skin once.     Orders Placed This Encounter  Procedures  . DME Other see comment    No Follow-up on file.

## 2014-07-15 ENCOUNTER — Encounter: Payer: Self-pay | Admitting: Internal Medicine

## 2014-07-15 NOTE — Assessment & Plan Note (Signed)
diagnosed with prior sleep study but treatment has been deferred  by patient.  Discussed the long term history of OSA , the risks of long term damage to heart and the signs and symptoms attributable to OSA.  Advised patient to consider repeat trialof CPAP, which he is willing to do

## 2014-07-18 ENCOUNTER — Telehealth: Payer: Self-pay | Admitting: *Deleted

## 2014-07-18 NOTE — Telephone Encounter (Signed)
Signed DME order from Dr. Derrel Nip. Left message for pt to call back, need to know what company he uses.

## 2014-07-18 NOTE — Telephone Encounter (Signed)
Called pt and he stated he is going to call sleep study and see which one he like to order before he orders one.

## 2014-07-19 ENCOUNTER — Telehealth: Payer: Self-pay | Admitting: *Deleted

## 2014-07-19 ENCOUNTER — Other Ambulatory Visit (INDEPENDENT_AMBULATORY_CARE_PROVIDER_SITE_OTHER): Payer: Medicare Other

## 2014-07-19 ENCOUNTER — Other Ambulatory Visit: Payer: Medicare Other

## 2014-07-19 DIAGNOSIS — R4189 Other symptoms and signs involving cognitive functions and awareness: Secondary | ICD-10-CM

## 2014-07-19 DIAGNOSIS — F09 Unspecified mental disorder due to known physiological condition: Secondary | ICD-10-CM | POA: Diagnosis not present

## 2014-07-19 DIAGNOSIS — E1059 Type 1 diabetes mellitus with other circulatory complications: Secondary | ICD-10-CM | POA: Diagnosis not present

## 2014-07-19 DIAGNOSIS — E119 Type 2 diabetes mellitus without complications: Secondary | ICD-10-CM

## 2014-07-19 LAB — HEMOGLOBIN A1C: HEMOGLOBIN A1C: 6.2 % (ref 4.6–6.5)

## 2014-07-19 LAB — COMPREHENSIVE METABOLIC PANEL
ALBUMIN: 3.8 g/dL (ref 3.5–5.2)
ALT: 15 U/L (ref 0–53)
AST: 21 U/L (ref 0–37)
Alkaline Phosphatase: 67 U/L (ref 39–117)
BUN: 12 mg/dL (ref 6–23)
CALCIUM: 9.3 mg/dL (ref 8.4–10.5)
CO2: 28 meq/L (ref 19–32)
Chloride: 103 mEq/L (ref 96–112)
Creatinine, Ser: 0.8 mg/dL (ref 0.4–1.5)
GFR: 105.32 mL/min (ref >60.00)
Glucose, Bld: 91 mg/dL (ref 70–99)
POTASSIUM: 4.1 meq/L (ref 3.5–5.1)
SODIUM: 138 meq/L (ref 135–145)
Total Bilirubin: 1 mg/dL (ref 0.2–1.2)
Total Protein: 6.5 g/dL (ref 6.0–8.3)

## 2014-07-19 LAB — VITAMIN B12: Vitamin B-12: 665 pg/mL (ref 211–911)

## 2014-07-19 LAB — LIPID PANEL
CHOLESTEROL: 231 mg/dL — AB (ref 0–200)
HDL: 65.9 mg/dL (ref >39.00)
LDL Cholesterol: 133 mg/dL — ABNORMAL HIGH (ref 0–99)
NonHDL: 165.1
Total CHOL/HDL Ratio: 4
Triglycerides: 162 mg/dL — ABNORMAL HIGH (ref 0.0–149.0)
VLDL: 32.4 mg/dL (ref 0.0–40.0)

## 2014-07-19 LAB — TSH: TSH: 0.95 u[IU]/mL (ref 0.35–4.50)

## 2014-07-19 NOTE — Telephone Encounter (Signed)
What labs and dx?  

## 2014-07-20 ENCOUNTER — Encounter: Payer: Self-pay | Admitting: *Deleted

## 2014-08-14 ENCOUNTER — Telehealth: Payer: Self-pay | Admitting: Internal Medicine

## 2014-08-14 NOTE — Telephone Encounter (Signed)
The patient's wife came into the office today stating that her husband refuses to be fitted for his CPAP mask. She informed me that if the physician spoke with the patient that he may go forward and have the mask fitting . She added that he needs the CPAP or medication.

## 2014-08-15 NOTE — Telephone Encounter (Signed)
Spoke with pt, scheduled appoint 38.9.15 at 11:30

## 2014-08-15 NOTE — Telephone Encounter (Signed)
I do not have time to call every patient back who is noncompliant.  i am happy to have a discussion with him in the office to explain why he needs to wear his CPAP

## 2014-08-17 ENCOUNTER — Encounter: Payer: Self-pay | Admitting: Internal Medicine

## 2014-08-17 ENCOUNTER — Ambulatory Visit (INDEPENDENT_AMBULATORY_CARE_PROVIDER_SITE_OTHER): Payer: Medicare Other | Admitting: Internal Medicine

## 2014-08-17 VITALS — BP 140/70 | HR 85 | Temp 97.7°F | Resp 14 | Ht 68.5 in | Wt 141.5 lb

## 2014-08-17 DIAGNOSIS — R4189 Other symptoms and signs involving cognitive functions and awareness: Secondary | ICD-10-CM | POA: Diagnosis not present

## 2014-08-17 DIAGNOSIS — G4733 Obstructive sleep apnea (adult) (pediatric): Secondary | ICD-10-CM

## 2014-08-17 NOTE — Progress Notes (Signed)
Patient ID: Hector Lucero, male   DOB: 11/30/1927, 80 y.o.   MRN: [id number]  Patient Active Problem List   Diagnosis Date Noted  . OSA (obstructive sleep apnea) 05/30/2014  . Cancer of prostate w/low recurrence risk (T1-2a, Gleason<7 & PSA<10) 03/09/2014  . Cognitive changes 06/23/2013  . Routine general medical examination at a health care facility 06/23/2013  . Fatigue 05/04/2012  . Diabetes mellitus type 2, diet-controlled 12/24/2011  . Hyperlipidemia LDL goal < 70 12/24/2011  . Tachycardia 07/30/2011  . Inguinal hernia 06/30/2011    Subjective:  CC:   Chief Complaint  Patient presents with  . Follow-up    CPAP    HPI:   Hector Lucero is a 80 y.o. male who presents for   follow up on OSA  Recently diagnosed by sleep study in July  Moderate to severe.  CPAP titration trial was done but patient had difficulty tolerating it during trial and has not been wearing it.  He cites the reason is because they did not answer his questions or meet with him ,they sent him a bill.  His wife is present today and states that he was offered a follow up appt but did not accept. It.  He also states that one of his neighbors has OSA that is treated with a medication and wants to know why he has to wear the CPAP.   Spent 20 minutes with patient and wife today reviewing prior sleep study,  The natural historyvof OSA and the long term effects on heart , lungs and cognition,    Past Medical History  Diagnosis Date  . Radial fracture 2006    right, screws and plates  . Ulnar fracture 1995    fall off of ladder  . Hypertension     Past Surgical History  Procedure Laterality Date  . Hernia repair  Aug 2012    right indirect inguinal, with mesh       The following portions of the patient's history were reviewed and updated as appropriate: Allergies, current medications, and problem list.    Review of Systems:   Patient denies headache, fevers, malaise, unintentional weight loss,  skin rash, eye pain, sinus congestion and sinus pain, sore throat, dysphagia,  hemoptysis , cough, dyspnea, wheezing, chest pain, palpitations, orthopnea, edema, abdominal pain, nausea, melena, diarrhea, constipation, flank pain, dysuria, hematuria, urinary  Frequency, nocturia, numbness, tingling, seizures,  Focal weakness, Loss of consciousness,  Tremor, insomnia, depression, anxiety, and suicidal ideation.     History   Social History  . Marital Status: Married    Spouse Name: N/A    Number of Children: N/A  . Years of Education: N/A   Occupational History  . Retired     Energy manager.   Social History Main Topics  . Smoking status: Never Smoker   . Smokeless tobacco: Never Used  . Alcohol Use: 1.8 oz/week    3 Glasses of wine per week     Comment: occasional  . Drug Use: No  . Sexual Activity: Not Currently   Other Topics Concern  . Not on file   Social History Narrative   Exercises regularly 5 days weekly at the [facility]    Objective:  Filed Vitals:   08/17/14 1133  BP: 140/70  Pulse: 85  Temp: 97.7 F (36.5 C)  Resp: 14     General appearance: alert, cooperative and appears stated age Ears: normal TM's and external ear canals both ears  Throat: lips, mucosa, and tongue normal; teeth and gums normal Neck: no adenopathy, no carotid bruit, supple, symmetrical, trachea midline and thyroid not enlarged, symmetric, no tenderness/mass/nodules Back: symmetric, no curvature. ROM normal. No CVA tenderness. Lungs: clear to auscultation bilaterally Heart: regular rate and rhythm, S1, S2 normal, no murmur, click, rub or gallop Abdomen: soft, non-tender; bowel sounds normal; no masses,  no organomegaly Pulses: 2+ and symmetric Skin: Skin color, texture, turgor normal. No rashes or lesions Lymph nodes: Cervical, supraclavicular, and axillary nodes normal.  Assessment and Plan:  OSA (obstructive sleep apnea) Encouraged to try the CPAP again,   Will prescribe a mild anxiolytic if necessary.    Cognitive changes Aggravated by untreated OSA. Long discussion with patient and wife today   A total of 25 minutes of face to face time was spent with patient more than half of which was spent in counselling and coordination of care    Updated Medication List Outpatient Encounter Prescriptions as of 08/17/2014  Medication Sig  . aspirin 81 MG tablet Take 81 mg by mouth daily.    Marland Kitchen diltiazem (CARDIZEM) 30 MG tablet Take 30 mg by mouth 2 (two) times daily.   . fish oil-omega-3 fatty acids 1000 MG capsule Take 1,000 mg by mouth daily.    . Multiple Vitamins-Minerals (MENS 50+ MULTI VITAMIN/MIN PO) Take 1 tablet by mouth daily.  . Red Yeast Rice Extract ([employer]S RED YEAST RICE) 600 MG CAPS Take 1 capsule (600 mg total) by mouth 2 (two) times daily.     No orders of the defined types were placed in this encounter.    No Follow-up on file.

## 2014-08-19 NOTE — Assessment & Plan Note (Signed)
Aggravated by untreated OSA. Long discussion with patient and wife today

## 2014-08-19 NOTE — Assessment & Plan Note (Signed)
Encouraged to try the CPAP again,  Will prescribe a mild anxiolytic if necessary.

## 2014-08-21 ENCOUNTER — Encounter: Payer: Self-pay | Admitting: Internal Medicine

## 2014-08-30 DIAGNOSIS — Z23 Encounter for immunization: Secondary | ICD-10-CM | POA: Diagnosis not present

## 2014-09-09 ENCOUNTER — Emergency Department: Payer: Self-pay | Admitting: Emergency Medicine

## 2014-09-09 DIAGNOSIS — S098XXA Other specified injuries of head, initial encounter: Secondary | ICD-10-CM | POA: Diagnosis not present

## 2014-09-09 DIAGNOSIS — E119 Type 2 diabetes mellitus without complications: Secondary | ICD-10-CM | POA: Diagnosis not present

## 2014-09-09 DIAGNOSIS — S0990XA Unspecified injury of head, initial encounter: Secondary | ICD-10-CM | POA: Diagnosis not present

## 2014-09-09 DIAGNOSIS — S0003XA Contusion of scalp, initial encounter: Secondary | ICD-10-CM | POA: Diagnosis not present

## 2014-09-09 DIAGNOSIS — R9431 Abnormal electrocardiogram [ECG] [EKG]: Secondary | ICD-10-CM | POA: Diagnosis not present

## 2014-09-09 DIAGNOSIS — S060X9A Concussion with loss of consciousness of unspecified duration, initial encounter: Secondary | ICD-10-CM | POA: Diagnosis not present

## 2014-09-09 LAB — CBC WITH DIFFERENTIAL/PLATELET
BASOS ABS: 0 10*3/uL (ref 0.0–0.1)
BASOS PCT: 0.2 %
EOS PCT: 0.1 %
Eosinophil #: 0 10*3/uL (ref 0.0–0.7)
HCT: 41.8 % (ref 40.0–52.0)
HGB: 13.8 g/dL (ref 13.0–18.0)
LYMPHS PCT: 8.7 %
Lymphocyte #: 0.7 10*3/uL — ABNORMAL LOW (ref 1.0–3.6)
MCH: 31.5 pg (ref 26.0–34.0)
MCHC: 33.1 g/dL (ref 32.0–36.0)
MCV: 95 fL (ref 80–100)
Monocyte #: 0.6 x10 3/mm (ref 0.2–1.0)
Monocyte %: 7.7 %
Neutrophil #: 7 10*3/uL — ABNORMAL HIGH (ref 1.4–6.5)
Neutrophil %: 83.3 %
Platelet: 184 10*3/uL (ref 150–440)
RBC: 4.39 10*6/uL — AB (ref 4.40–5.90)
RDW: 13.1 % (ref 11.5–14.5)
WBC: 8.4 10*3/uL (ref 3.8–10.6)

## 2014-09-09 LAB — COMPREHENSIVE METABOLIC PANEL
ALK PHOS: 87 U/L
ALT: 26 U/L
ANION GAP: 8 (ref 7–16)
Albumin: 3.7 g/dL (ref 3.4–5.0)
BILIRUBIN TOTAL: 0.6 mg/dL (ref 0.2–1.0)
BUN: 17 mg/dL (ref 7–18)
Calcium, Total: 8.4 mg/dL — ABNORMAL LOW (ref 8.5–10.1)
Chloride: 105 mmol/L (ref 98–107)
Co2: 28 mmol/L (ref 21–32)
Creatinine: 0.94 mg/dL (ref 0.60–1.30)
EGFR (African American): >60
EGFR (Non-African Amer.): >60
Glucose: 163 mg/dL — ABNORMAL HIGH (ref 65–99)
OSMOLALITY: 286 (ref 275–301)
Potassium: 3.8 mmol/L (ref 3.5–5.1)
SGOT(AST): 29 U/L (ref 15–37)
Sodium: 141 mmol/L (ref 136–145)
TOTAL PROTEIN: 7.1 g/dL (ref 6.4–8.2)

## 2014-09-10 ENCOUNTER — Telehealth: Payer: Self-pay | Admitting: Internal Medicine

## 2014-09-10 NOTE — Telephone Encounter (Signed)
The patient is needing an appointment as soon as possible . The patient fell and was seen in the ER on 11.1.15 . He injured his back and hit his head.

## 2014-09-10 NOTE — Telephone Encounter (Signed)
The earliest I have would be 11.30 on 09/27/14 at 11.30 if that fits your schedule.

## 2014-09-10 NOTE — Telephone Encounter (Signed)
Patient aware can you put on schedule Thursday 12 ?

## 2014-09-10 NOTE — Telephone Encounter (Signed)
12:00 on Thursday

## 2014-09-11 NOTE — Telephone Encounter (Signed)
The patient has been scheduled

## 2014-09-13 ENCOUNTER — Encounter: Payer: Self-pay | Admitting: Internal Medicine

## 2014-09-13 ENCOUNTER — Ambulatory Visit (INDEPENDENT_AMBULATORY_CARE_PROVIDER_SITE_OTHER): Payer: Medicare Other | Admitting: Internal Medicine

## 2014-09-13 VITALS — BP 104/64 | HR 85 | Temp 97.3°F | Resp 14 | Ht 68.0 in | Wt 137.2 lb

## 2014-09-13 DIAGNOSIS — F0150 Vascular dementia without behavioral disturbance: Secondary | ICD-10-CM

## 2014-09-13 DIAGNOSIS — Z9181 History of falling: Secondary | ICD-10-CM

## 2014-09-13 DIAGNOSIS — S060X0D Concussion without loss of consciousness, subsequent encounter: Secondary | ICD-10-CM | POA: Diagnosis not present

## 2014-09-13 NOTE — Patient Instructions (Addendum)
You do not appear to have fractured a vertebrae .  You strained some muscles  You can use tylenol and aleve twice daily  For the next few days  Use heat for up to 15 minutes several times daily   Remove the slippery rug in the bathroom!!  Please give up the cocktails !  Your brain can no longer handle alcohol   Concussion A concussion, or closed-head injury, is a brain injury caused by a direct blow to the head or by a quick and sudden movement (jolt) of the head or neck. Concussions are usually not life-threatening. Even so, the effects of a concussion can be serious. If you have had a concussion before, you are more likely to experience concussion-like symptoms after a direct blow to the head.  CAUSES  Direct blow to the head, such as from running into another player during a soccer game, being hit in a fight, or hitting your head on a hard surface.  A jolt of the head or neck that causes the brain to move back and forth inside the skull, such as in a car crash. SIGNS AND SYMPTOMS The signs of a concussion can be hard to notice. Early on, they may be missed by you, family members, and health care providers. You may look fine but act or feel differently. Symptoms are usually temporary, but they may last for days, weeks, or even longer. Some symptoms may appear right away while others may not show up for hours or days. Every head injury is different. Symptoms include:  Mild to moderate headaches that will not go away.  A feeling of pressure inside your head.  Having more trouble than usual:  Learning or remembering things you have heard.  Answering questions.  Paying attention or concentrating.  Organizing daily tasks.  Making decisions and solving problems.  Slowness in thinking, acting or reacting, speaking, or reading.  Getting lost or being easily confused.  Feeling tired all the time or lacking energy (fatigued).  Feeling drowsy.  Sleep disturbances.  Sleeping more  than usual.  Sleeping less than usual.  Trouble falling asleep.  Trouble sleeping (insomnia).  Loss of balance or feeling lightheaded or dizzy.  Nausea or vomiting.  Numbness or tingling.  Increased sensitivity to:  Sounds.  Lights.  Distractions.  Vision problems or eyes that tire easily.  Diminished sense of taste or smell.  Ringing in the ears.  Mood changes such as feeling sad or anxious.  Becoming easily irritated or angry for little or no reason.  Lack of motivation.  Seeing or hearing things other people do not see or hear (hallucinations). DIAGNOSIS Your health care provider can usually diagnose a concussion based on a description of your injury and symptoms. He or she will ask whether you passed out (lost consciousness) and whether you are having trouble remembering events that happened right before and during your injury. Your evaluation might include:  A brain scan to look for signs of injury to the brain. Even if the test shows no injury, you may still have a concussion.  Blood tests to be sure other problems are not present. TREATMENT  Concussions are usually treated in an emergency department, in urgent care, or at a clinic. You may need to stay in the hospital overnight for further treatment.  Tell your health care provider if you are taking any medicines, including prescription medicines, over-the-counter medicines, and natural remedies. Some medicines, such as blood thinners (anticoagulants) and aspirin, may increase the  chance of complications. Also tell your health care provider whether you have had alcohol or are taking illegal drugs. This information may affect treatment.  Your health care provider will send you home with important instructions to follow.  How fast you will recover from a concussion depends on many factors. These factors include how severe your concussion is, what part of your brain was injured, your age, and how healthy you were  before the concussion.  Most people with mild injuries recover fully. Recovery can take time. In general, recovery is slower in older persons. Also, persons who have had a concussion in the past or have other medical problems may find that it takes longer to recover from their current injury. HOME CARE INSTRUCTIONS General Instructions  Carefully follow the directions your health care provider gave you.  Only take over-the-counter or prescription medicines for pain, discomfort, or fever as directed by your health care provider.  Take only those medicines that your health care provider has approved.  Do not drink alcohol until your health care provider says you are well enough to do so. Alcohol and certain other drugs may slow your recovery and can put you at risk of further injury.  If it is harder than usual to remember things, write them down.  If you are easily distracted, try to do one thing at a time. For example, do not try to watch TV while fixing dinner.  Talk with family members or close friends when making important decisions.  Keep all follow-up appointments. Repeated evaluation of your symptoms is recommended for your recovery.  Watch your symptoms and tell others to do the same. Complications sometimes occur after a concussion. Older adults with a brain injury may have a higher risk of serious complications, such as a blood clot on the brain.  Tell your teachers, school nurse, school counselor, coach, athletic trainer, or work Freight forwarder about your injury, symptoms, and restrictions. Tell them about what you can or cannot do. They should watch for:  Increased problems with attention or concentration.  Increased difficulty remembering or learning new information.  Increased time needed to complete tasks or assignments.  Increased irritability or decreased ability to cope with stress.  Increased symptoms.  Rest. Rest helps the brain to heal. Make sure you:  Get plenty of  sleep at night. Avoid staying up late at night.  Keep the same bedtime hours on weekends and weekdays.  Rest during the day. Take daytime naps or rest breaks when you feel tired.  Limit activities that require a lot of thought or concentration. These include:  Doing homework or job-related work.  Watching TV.  Working on the computer.  Avoid any situation where there is potential for another head injury (football, hockey, soccer, basketball, martial arts, downhill snow sports and horseback riding). Your condition will get worse every time you experience a concussion. You should avoid these activities until you are evaluated by the appropriate follow-up health care providers. Returning To Your Regular Activities You will need to return to your normal activities slowly, not all at once. You must give your body and brain enough time for recovery.  Do not return to sports or other athletic activities until your health care provider tells you it is safe to do so.  Ask your health care provider when you can drive, ride a bicycle, or operate heavy machinery. Your ability to react may be slower after a brain injury. Never do these activities if you are dizzy.  Ask  your health care provider about when you can return to work or school. Preventing Another Concussion It is very important to avoid another brain injury, especially before you have recovered. In rare cases, another injury can lead to permanent brain damage, brain swelling, or death. The risk of this is greatest during the first 7-10 days after a head injury. Avoid injuries by:  Wearing a seat belt when riding in a car.  Drinking alcohol only in moderation.  Wearing a helmet when biking, skiing, skateboarding, skating, or doing similar activities.  Avoiding activities that could lead to a second concussion, such as contact or recreational sports, until your health care provider says it is okay.  Taking safety measures in your  home.  Remove clutter and tripping hazards from floors and stairways.  Use grab bars in bathrooms and handrails by stairs.  Place non-slip mats on floors and in bathtubs.  Improve lighting in dim areas. SEEK MEDICAL CARE IF:  You have increased problems paying attention or concentrating.  You have increased difficulty remembering or learning new information.  You need more time to complete tasks or assignments than before.  You have increased irritability or decreased ability to cope with stress.  You have more symptoms than before. Seek medical care if you have any of the following symptoms for more than 2 weeks after your injury:  Lasting (chronic) headaches.  Dizziness or balance problems.  Nausea.  Vision problems.  Increased sensitivity to noise or light.  Depression or mood swings.  Anxiety or irritability.  Memory problems.  Difficulty concentrating or paying attention.  Sleep problems.  Feeling tired all the time. SEEK IMMEDIATE MEDICAL CARE IF:  You have severe or worsening headaches. These may be a sign of a blood clot in the brain.  You have weakness (even if only in one hand, leg, or part of the face).  You have numbness.  You have decreased coordination.  You vomit repeatedly.  You have increased sleepiness.  One pupil is larger than the other.  You have convulsions.  You have slurred speech.  You have increased confusion. This may be a sign of a blood clot in the brain.  You have increased restlessness, agitation, or irritability.  You are unable to recognize people or places.  You have neck pain.  It is difficult to wake you up.  You have unusual behavior changes.  You lose consciousness. MAKE SURE YOU:  Understand these instructions.  Will watch your condition.  Will get help right away if you are not doing well or get worse. Document Released: 01/16/2004 Document Revised: 10/31/2013 Document Reviewed:  05/18/2013 [facility] Patient Information 2015 [place], [place]. This information is not intended to replace advice given to you by your health care provider. Make sure you discuss any questions you have with your health care provider.

## 2014-09-13 NOTE — Progress Notes (Signed)
Pre-visit discussion using our clinic review tool. No additional management support is needed unless otherwise documented below in the visit note.  

## 2014-09-13 NOTE — Progress Notes (Signed)
Patient ID: Hector Lucero, male   DOB: January 18, 1928, 80 y.o.   MRN: [id number]   Patient Active Problem List   Diagnosis Date Noted  . History of recent fall 09/16/2014  . Concussion without loss of consciousness 09/16/2014  . OSA (obstructive sleep apnea) 05/30/2014  . Cancer of prostate w/low recurrence risk (T1-2a, Gleason<7 & PSA<10) 03/09/2014  . Dementia, vascular 06/23/2013  . Routine general medical examination at a health care facility 06/23/2013  . Fatigue 05/04/2012  . Diabetes mellitus type 2, diet-controlled 12/24/2011  . Hyperlipidemia LDL goal < 70 12/24/2011  . Tachycardia 07/30/2011  . Inguinal hernia 06/30/2011    Subjective:  CC:   Chief Complaint  Patient presents with  . Follow-up    ER for a fall hit his head     HPI:   Hector Lucero is a 80 y.o. male who presents for  ER follow up for possible concussion,  i n the setting of dementia. which occurred on Oct 31.  The ER visit was on Nov 1.  According to his wife, who accompanied him today , he had an unwitnessed fall  At home in the middle of the night  On Oct 31st.   The fall occurred n  Saturday night after going to a party .  At the party, wife noticed hypersomnolence and asked him how many glasses of wine he had,  He remembered drinking two.  She allowed him to drive them home despite his initial mistake of driving on the sidewalk,  There were no accidents on the way home.  He did not use his new  CPAP mask that night  At 1 am woke up wife to tell her that he had fallen while sleeping in the other bedroom.  He does not recall whether he was in the bathroom or the bedroom.  He went back to bed after telling her, but woke up Sunday morning complaining of back pain ,  Went to [facility] Sunday morning for evaluation and when MD noticed half dollar sized bruise on scalp, so she sent him to ER for evaluation with CT head which noted atrophy only. The spine was not imaged     Has been having back pain ever since.    No radiation. No appetite.  Had diarrhea starting Sunday 12 loose stools, which he states  resolved by Monday afternoon.  Last BM was this morning solid.     Past Medical History  Diagnosis Date  . Radial fracture 2006    right, screws and plates  . Ulnar fracture 1995    fall off of ladder  . Hypertension     Past Surgical History  Procedure Laterality Date  . Hernia repair  Aug 2012    right indirect inguinal, with mesh       The following portions of the patient's history were reviewed and updated as appropriate: Allergies, current medications, and problem list.    Review of Systems:   Patient denies headache, fevers, malaise, unintentional weight loss, skin rash, eye pain, sinus congestion and sinus pain, sore throat, dysphagia,  hemoptysis , cough, dyspnea, wheezing, chest pain, palpitations, orthopnea, edema, abdominal pain, nausea, melena, diarrhea, constipation, flank pain, dysuria, hematuria, urinary  Frequency, nocturia, numbness, tingling, seizures,  Focal weakness, Loss of consciousness,  Tremor, insomnia, depression, anxiety, and suicidal ideation.     History   Social History  . Marital Status: Married    Spouse Name: N/A    Number of Children: N/A  .  Years of Education: N/A   Occupational History  . Retired     Energy manager.   Social History Main Topics  . Smoking status: Never Smoker   . Smokeless tobacco: Never Used  . Alcohol Use: 1.8 oz/week    3 Glasses of wine per week     Comment: occasional  . Drug Use: No  . Sexual Activity: Not Currently   Other Topics Concern  . Not on file   Social History Narrative   Exercises regularly 5 days weekly at the [facility]    Objective:  Filed Vitals:   09/13/14 1214  BP: 104/64  Pulse: 85  Temp: 97.3 F (36.3 C)  Resp: 14     General appearance: alert, cooperative and appears stated age Scalp, quarter sized ecchymosis to scalp , parietal area,  Ears: normal  TM's and external ear canals both ears Throat: lips, mucosa, and tongue normal; teeth and gums normal Neck: no adenopathy, no carotid bruit, supple, symmetrical, trachea midline and thyroid not enlarged, symmetric, no tenderness/mass/nodules Back: symmetric, no curvature. ROM normal. No CVA tenderness. Lungs: clear to auscultation bilaterally Heart: regular rate and rhythm, S1, S2 normal, no murmur, click, rub or gallop Abdomen: soft, non-tender; bowel sounds normal; no masses,  no organomegaly Pulses: 2+ and symmetric Skin: Skin color, texture, turgor normal. No rashes or lesions Lymph nodes: Cervical, supraclavicular, and axillary nodes normal.  Assessment and Plan:  History of recent fall Unwitnessed, resulting in blunt head trauma and contusions to head and back.  May have been due to recent ingestion of alcohol in the setting of dementia and untreated OSA. Patient appears back to baseline today   Dementia, vascular Aggravated by untreated OSA. Long discussion with patient and wife today about need to avoid alcohol completely.  Referral to neurology offered but declined.     Concussion without loss of consciousness Dsecondary to fall at home, unwitnessed with blunt head trauma surmised from bruise on scalp. iscussed diagnosis and ways to reduce long term brain injury.    A total of 40 minutes was spent with patient more than half of which was spent in counseling patient on the above mentioned issues , reviewing and explaining recent labs and imaging studies done, and coordination of care. Updated Medication List Outpatient Encounter Prescriptions as of 09/13/2014  Medication Sig  . aspirin 81 MG tablet Take 81 mg by mouth daily.    Marland Kitchen diltiazem (CARDIZEM) 30 MG tablet Take 30 mg by mouth 2 (two) times daily.   . fish oil-omega-3 fatty acids 1000 MG capsule Take 1,000 mg by mouth daily.    . Multiple Vitamins-Minerals (MENS 50+ MULTI VITAMIN/MIN PO) Take 1 tablet by mouth daily.  .  Red Yeast Rice Extract ([employer] RED YEAST RICE) 600 MG CAPS Take 1 capsule (600 mg total) by mouth 2 (two) times daily.

## 2014-09-16 DIAGNOSIS — F0150 Vascular dementia without behavioral disturbance: Secondary | ICD-10-CM | POA: Insufficient documentation

## 2014-09-16 DIAGNOSIS — Z9181 History of falling: Secondary | ICD-10-CM | POA: Insufficient documentation

## 2014-09-16 NOTE — Assessment & Plan Note (Signed)
Unwitnessed, resulting in blunt head trauma and contusions to head and back.  May have been due to recent ingestion of alcohol in the setting of dementia and untreated OSA. Patient appears back to baseline today

## 2014-09-16 NOTE — Assessment & Plan Note (Addendum)
Dsecondary to fall at home, unwitnessed with blunt head trauma surmised from bruise on scalp. iscussed diagnosis and ways to reduce long term brain injury.

## 2014-09-16 NOTE — Assessment & Plan Note (Signed)
Aggravated by untreated OSA. Long discussion with patient and wife today about need to avoid alcohol completely.  Referral to neurology offered but declined.

## 2014-09-17 ENCOUNTER — Telehealth: Payer: Self-pay | Admitting: Internal Medicine

## 2014-09-17 DIAGNOSIS — Z9181 History of falling: Secondary | ICD-10-CM

## 2014-09-17 NOTE — Telephone Encounter (Signed)
Patient called and stated he needs a referral for PT sent to [facility] Please advise.

## 2014-09-17 NOTE — Telephone Encounter (Signed)
Referral is in process as requested to [facility]

## 2014-09-17 NOTE — Telephone Encounter (Signed)
Referral has been faxed as requested.

## 2014-09-19 DIAGNOSIS — M545 Low back pain: Secondary | ICD-10-CM | POA: Diagnosis not present

## 2014-09-19 DIAGNOSIS — R262 Difficulty in walking, not elsewhere classified: Secondary | ICD-10-CM | POA: Diagnosis not present

## 2014-09-21 DIAGNOSIS — R262 Difficulty in walking, not elsewhere classified: Secondary | ICD-10-CM | POA: Diagnosis not present

## 2014-09-21 DIAGNOSIS — M545 Low back pain: Secondary | ICD-10-CM | POA: Diagnosis not present

## 2014-09-24 DIAGNOSIS — M545 Low back pain: Secondary | ICD-10-CM | POA: Diagnosis not present

## 2014-09-24 DIAGNOSIS — R262 Difficulty in walking, not elsewhere classified: Secondary | ICD-10-CM | POA: Diagnosis not present

## 2014-09-26 DIAGNOSIS — R262 Difficulty in walking, not elsewhere classified: Secondary | ICD-10-CM | POA: Diagnosis not present

## 2014-09-26 DIAGNOSIS — M545 Low back pain: Secondary | ICD-10-CM | POA: Diagnosis not present

## 2014-09-27 DIAGNOSIS — M545 Low back pain: Secondary | ICD-10-CM | POA: Diagnosis not present

## 2014-09-27 DIAGNOSIS — R262 Difficulty in walking, not elsewhere classified: Secondary | ICD-10-CM | POA: Diagnosis not present

## 2014-10-01 DIAGNOSIS — M545 Low back pain: Secondary | ICD-10-CM | POA: Diagnosis not present

## 2014-10-01 DIAGNOSIS — R262 Difficulty in walking, not elsewhere classified: Secondary | ICD-10-CM | POA: Diagnosis not present

## 2014-10-03 DIAGNOSIS — M545 Low back pain: Secondary | ICD-10-CM | POA: Diagnosis not present

## 2014-10-03 DIAGNOSIS — R262 Difficulty in walking, not elsewhere classified: Secondary | ICD-10-CM | POA: Diagnosis not present

## 2014-10-05 DIAGNOSIS — R262 Difficulty in walking, not elsewhere classified: Secondary | ICD-10-CM | POA: Diagnosis not present

## 2014-10-05 DIAGNOSIS — M545 Low back pain: Secondary | ICD-10-CM | POA: Diagnosis not present

## 2014-10-08 DIAGNOSIS — R262 Difficulty in walking, not elsewhere classified: Secondary | ICD-10-CM | POA: Diagnosis not present

## 2014-10-08 DIAGNOSIS — M545 Low back pain: Secondary | ICD-10-CM | POA: Diagnosis not present

## 2014-10-10 DIAGNOSIS — Z9181 History of falling: Secondary | ICD-10-CM | POA: Diagnosis not present

## 2014-10-10 DIAGNOSIS — R262 Difficulty in walking, not elsewhere classified: Secondary | ICD-10-CM | POA: Diagnosis not present

## 2014-10-10 DIAGNOSIS — M545 Low back pain: Secondary | ICD-10-CM | POA: Diagnosis not present

## 2014-10-12 DIAGNOSIS — H34812 Central retinal vein occlusion, left eye: Secondary | ICD-10-CM | POA: Diagnosis not present

## 2014-10-12 DIAGNOSIS — M545 Low back pain: Secondary | ICD-10-CM | POA: Diagnosis not present

## 2014-10-12 DIAGNOSIS — Z9181 History of falling: Secondary | ICD-10-CM | POA: Diagnosis not present

## 2014-10-12 DIAGNOSIS — H35352 Cystoid macular degeneration, left eye: Secondary | ICD-10-CM | POA: Diagnosis not present

## 2014-10-12 DIAGNOSIS — R262 Difficulty in walking, not elsewhere classified: Secondary | ICD-10-CM | POA: Diagnosis not present

## 2014-10-13 DIAGNOSIS — H34812 Central retinal vein occlusion, left eye: Secondary | ICD-10-CM | POA: Diagnosis not present

## 2014-10-17 ENCOUNTER — Encounter: Payer: Self-pay | Admitting: Internal Medicine

## 2014-10-24 ENCOUNTER — Encounter (INDEPENDENT_AMBULATORY_CARE_PROVIDER_SITE_OTHER): Payer: Self-pay

## 2014-10-24 ENCOUNTER — Ambulatory Visit (INDEPENDENT_AMBULATORY_CARE_PROVIDER_SITE_OTHER): Payer: Medicare Other | Admitting: Internal Medicine

## 2014-10-24 ENCOUNTER — Encounter: Payer: Self-pay | Admitting: Internal Medicine

## 2014-10-24 VITALS — BP 120/70 | HR 83 | Temp 97.4°F | Ht 68.0 in | Wt 131.2 lb

## 2014-10-24 DIAGNOSIS — E785 Hyperlipidemia, unspecified: Secondary | ICD-10-CM

## 2014-10-24 DIAGNOSIS — F0150 Vascular dementia without behavioral disturbance: Secondary | ICD-10-CM | POA: Diagnosis not present

## 2014-10-24 DIAGNOSIS — R4189 Other symptoms and signs involving cognitive functions and awareness: Secondary | ICD-10-CM

## 2014-10-24 DIAGNOSIS — Z9181 History of falling: Secondary | ICD-10-CM | POA: Diagnosis not present

## 2014-10-24 DIAGNOSIS — G4733 Obstructive sleep apnea (adult) (pediatric): Secondary | ICD-10-CM

## 2014-10-24 DIAGNOSIS — M545 Low back pain: Secondary | ICD-10-CM | POA: Diagnosis not present

## 2014-10-24 DIAGNOSIS — R262 Difficulty in walking, not elsewhere classified: Secondary | ICD-10-CM | POA: Diagnosis not present

## 2014-10-24 DIAGNOSIS — E119 Type 2 diabetes mellitus without complications: Secondary | ICD-10-CM

## 2014-10-24 NOTE — Patient Instructions (Signed)
MRI of brain and Neurology appt to be scheduled for you by our office  Please supplement your diet with protein shakes and eat more scrambled eggs/bacon

## 2014-10-24 NOTE — Progress Notes (Signed)
Patient ID: Hector Lucero, male   DOB: 1928-03-02, 80 y.o.   MRN: [id number]   Patient Active Problem List   Diagnosis Date Noted  . History of recent fall 09/16/2014  . Concussion without loss of consciousness 09/16/2014  . OSA (obstructive sleep apnea) 05/30/2014  . Cancer of prostate w/low recurrence risk (T1-2a, Gleason<7 & PSA<10) 03/09/2014  . Dementia, vascular 06/23/2013  . Routine general medical examination at a health care facility 06/23/2013  . Fatigue 05/04/2012  . Diabetes mellitus type 2, diet-controlled 12/24/2011  . Hyperlipidemia with target LDL less than 70 12/24/2011  . Tachycardia 07/30/2011  . Inguinal hernia 06/30/2011    Subjective:  CC:   Chief Complaint  Patient presents with  . Follow-up    f/u on cpap usage-pt reports that he is tolerating cpap well & using it nightly (all night)     HPI:   Hector Lucero is a 80 y.o. male who presents for   Follow up on multiple issues including cognitive decline, recent diagnosis of OSA and weight loss .  He has  Had a weight loss of 6 lbs in one month.  He has a remote history of obesity and has maintained a normal BMI with strict control of diet.  However his appetite has declined and his wife is concerned. He denies abdominal pain, change in stool habits, and nausea   OSA: after last moths's visit he as agreed to wear the CPAP  And notes improved sleep and daytime wakefulness.  Wife reports that he is not falling asleep during day since using CPAP  His wife reports that his memory continues to decline. He has gotten lost several times driving to well known places.   He has stopped drinking alcohol per my advice since his last intake resulted in a fall at home with concussion suspected by ED evaluation ,       Past Medical History  Diagnosis Date  . Radial fracture 2006    right, screws and plates  . Ulnar fracture 1995    fall off of ladder  . Hypertension     Past Surgical History  Procedure  Laterality Date  . Hernia repair  Aug 2012    right indirect inguinal, with mesh       The following portions of the patient's history were reviewed and updated as appropriate: Allergies, current medications, and problem list.    Review of Systems:   Patient denies headache, fevers, malaise, unintentional weight loss, skin rash, eye pain, sinus congestion and sinus pain, sore throat, dysphagia,  hemoptysis , cough, dyspnea, wheezing, chest pain, palpitations, orthopnea, edema, abdominal pain, nausea, melena, diarrhea, constipation, flank pain, dysuria, hematuria, urinary  Frequency, nocturia, numbness, tingling, seizures,  Focal weakness, Loss of consciousness,  Tremor, insomnia, depression, anxiety, and suicidal ideation.     History   Social History  . Marital Status: Married    Spouse Name: N/A    Number of Children: N/A  . Years of Education: N/A   Occupational History  . Retired     Energy manager.   Social History Main Topics  . Smoking status: Never Smoker   . Smokeless tobacco: Never Used  . Alcohol Use: 1.8 oz/week    3 Glasses of wine per week     Comment: occasional  . Drug Use: No  . Sexual Activity: Not Currently   Other Topics Concern  . Not on file   Social History Narrative  Exercises regularly 5 days weekly at the [facility]    Objective:  Filed Vitals:   10/24/14 1347  BP: 120/70  Pulse: 83  Temp: 97.4 F (36.3 C)     General appearance: alert, cooperative and appears stated age Ears: normal TM's and external ear canals both ears Throat: lips, mucosa, and tongue normal; teeth and gums normal Neck: no adenopathy, no carotid bruit, supple, symmetrical, trachea midline and thyroid not enlarged, symmetric, no tenderness/mass/nodules Back: symmetric, no curvature. ROM normal. No CVA tenderness. Lungs: clear to auscultation bilaterally Heart: regular rate and rhythm, S1, S2 normal, no murmur, click, rub or  gallop Abdomen: soft, non-tender; bowel sounds normal; no masses,  no organomegaly Pulses: 2+ and symmetric Skin: Skin color, texture, turgor normal. No rashes or lesions Lymph nodes: Cervical, supraclavicular, and axillary nodes normal.  Assessment and Plan:  OSA (obstructive sleep apnea) Diagnosed by sleep study. She is wearing her CPAP every night a minimum of 6 hours per night and notes improved daytime wakefulness and decreased fatigue   Diabetes mellitus type 2, diet-controlled Well-controlled on diet alone .  hemoglobin A1c has been consistently less than 7.0 . Patient is up-to-date on eye exams and foot exams   There is  no proteinuria on prior micro urinalysis .  Fasting lipids have been repeated and the hypertriglyceridemia seen earlier was due to nonfasting state. continue red yeast rice. CAD .  Lab Results  Component Value Date   HGBA1C 6.2 07/19/2014   Lab Results  Component Value Date   MICROALBUR 1.3 03/07/2014         Dementia, vascular Presumed, based on history of DM , but prior MRI has been deferred until now. MRI ordered and neurology evaluation to follow   Hyperlipidemia with target LDL less than 70 Managed with red yeast rice. Given his age and TC/HC ratio,  No statin advised.   Lab Results  Component Value Date   CHOL 231* 07/19/2014   HDL 65.90 07/19/2014   LDLCALC 133* 07/19/2014   LDLDIRECT 131.2 12/05/2013   TRIG 162.0* 07/19/2014   CHOLHDL 4 07/19/2014    Lab Results  Component Value Date   ALT 15 07/19/2014   AST 21 07/19/2014   ALKPHOS 67 07/19/2014   BILITOT 1.0 07/19/2014          Updated Medication List Outpatient Encounter Prescriptions as of 10/24/2014  Medication Sig  . aspirin 81 MG tablet Take 81 mg by mouth daily.    Marland Kitchen diltiazem (CARDIZEM) 30 MG tablet Take 30 mg by mouth 2 (two) times daily.   . fish oil-omega-3 fatty acids 1000 MG capsule Take 1,000 mg by mouth daily.    . Multiple Vitamins-Minerals (MENS 50+  MULTI VITAMIN/MIN PO) Take 1 tablet by mouth daily.  . Red Yeast Rice Extract ([employer] RED YEAST RICE) 600 MG CAPS Take 1 capsule (600 mg total) by mouth 2 (two) times daily.     Orders Placed This Encounter  Procedures  . MR Brain Wo Contrast  . Ambulatory referral to Neurology    Return in about 3 months (around 01/23/2015).

## 2014-10-24 NOTE — Assessment & Plan Note (Addendum)
Diagnosed by sleep study. She is wearing her CPAP every night a minimum of 6 hours per night and notes improved daytime wakefulness and decreased fatigue  

## 2014-10-24 NOTE — Progress Notes (Signed)
Pre visit review using our clinic review tool, if applicable. No additional management support is needed unless otherwise documented below in the visit note. 

## 2014-10-26 ENCOUNTER — Encounter: Payer: Self-pay | Admitting: Internal Medicine

## 2014-10-26 ENCOUNTER — Other Ambulatory Visit: Payer: Self-pay | Admitting: Internal Medicine

## 2014-10-26 DIAGNOSIS — F0390 Unspecified dementia without behavioral disturbance: Secondary | ICD-10-CM

## 2014-10-26 DIAGNOSIS — Z9181 History of falling: Secondary | ICD-10-CM | POA: Diagnosis not present

## 2014-10-26 DIAGNOSIS — R4689 Other symptoms and signs involving appearance and behavior: Principal | ICD-10-CM

## 2014-10-26 DIAGNOSIS — R4189 Other symptoms and signs involving cognitive functions and awareness: Secondary | ICD-10-CM

## 2014-10-26 DIAGNOSIS — R262 Difficulty in walking, not elsewhere classified: Secondary | ICD-10-CM | POA: Diagnosis not present

## 2014-10-26 DIAGNOSIS — M545 Low back pain: Secondary | ICD-10-CM | POA: Diagnosis not present

## 2014-10-27 ENCOUNTER — Encounter: Payer: Self-pay | Admitting: Internal Medicine

## 2014-10-27 NOTE — Assessment & Plan Note (Signed)
Presumed, based on history of DM , but prior MRI has been deferred until now. MRI ordered and neurology evaluation to follow

## 2014-10-27 NOTE — Assessment & Plan Note (Signed)
Managed with red yeast rice. Given his age and TC/HC ratio,  No statin advised.   Lab Results  Component Value Date   CHOL 231* 07/19/2014   HDL 65.90 07/19/2014   LDLCALC 133* 07/19/2014   LDLDIRECT 131.2 12/05/2013   TRIG 162.0* 07/19/2014   CHOLHDL 4 07/19/2014    Lab Results  Component Value Date   ALT 15 07/19/2014   AST 21 07/19/2014   ALKPHOS 67 07/19/2014   BILITOT 1.0 07/19/2014

## 2014-10-27 NOTE — Assessment & Plan Note (Signed)
Well-controlled on diet alone .  hemoglobin A1c has been consistently less than 7.0 . Patient is up-to-date on eye exams and foot exams   There is  no proteinuria on prior micro urinalysis .  Fasting lipids have been repeated and the hypertriglyceridemia seen earlier was due to nonfasting state. continue red yeast rice. CAD .  Lab Results  Component Value Date   HGBA1C 6.2 07/19/2014   Lab Results  Component Value Date   MICROALBUR 1.3 03/07/2014

## 2014-10-31 DIAGNOSIS — R413 Other amnesia: Secondary | ICD-10-CM | POA: Diagnosis not present

## 2014-10-31 DIAGNOSIS — R4189 Other symptoms and signs involving cognitive functions and awareness: Secondary | ICD-10-CM | POA: Diagnosis not present

## 2014-11-15 ENCOUNTER — Telehealth: Payer: Self-pay | Admitting: Internal Medicine

## 2014-11-15 NOTE — Telephone Encounter (Signed)
Patient was suppose to be seen for CT on 11/06/14 and no showed, Patient spouse stated patient was not in town he was in [place], Patient wife stated before Christmas 3 voicemail's from this office were left on answering machine, one for Dr. Melrose Nakayama and the CT scan but patient understood them to be same day appointment. So patient saw Dr. Melrose Nakayama on 10/30/14.  But left town before having CT scan.

## 2014-11-16 NOTE — Telephone Encounter (Signed)
So it will have to be rescheduled. No problem,  Let helen know?

## 2014-11-21 ENCOUNTER — Telehealth: Payer: Self-pay | Admitting: *Deleted

## 2014-11-21 NOTE — Telephone Encounter (Signed)
Pt came to office on 11/13/14 and left this message for Dr. Derrel Nip: "I am not sure that Dr. Melrose Nakayama gave me the test you ordered for me. The test I had showed only the degree of dementia. I just went to [facility] to try to find out but it didn't accomplish too much. Dr. Lupita Dawn response: "Wife needs to go with him to next appointment. Brain imaging needs to be rescheduled." Pt was notified at that time that Dr. Derrel Nip was out of the office x 1 week.  Spoke to patient's wife, instructions given. States imaging has been rescheduled to 11/26/14. States she tries to go with him to appointment, but he sometimes just goes on his on and doesn't tell her.

## 2014-11-24 LAB — HM DIABETES EYE EXAM

## 2014-11-26 ENCOUNTER — Ambulatory Visit: Payer: Self-pay | Admitting: Internal Medicine

## 2014-11-26 ENCOUNTER — Telehealth: Payer: Self-pay | Admitting: Internal Medicine

## 2014-11-26 DIAGNOSIS — F0390 Unspecified dementia without behavioral disturbance: Secondary | ICD-10-CM | POA: Diagnosis not present

## 2014-11-26 DIAGNOSIS — F0150 Vascular dementia without behavioral disturbance: Secondary | ICD-10-CM

## 2014-11-26 DIAGNOSIS — G3184 Mild cognitive impairment, so stated: Secondary | ICD-10-CM | POA: Diagnosis not present

## 2014-11-26 DIAGNOSIS — Z950 Presence of cardiac pacemaker: Secondary | ICD-10-CM | POA: Diagnosis not present

## 2014-11-26 DIAGNOSIS — Z8673 Personal history of transient ischemic attack (TIA), and cerebral infarction without residual deficits: Secondary | ICD-10-CM | POA: Diagnosis not present

## 2014-11-26 DIAGNOSIS — R413 Other amnesia: Secondary | ICD-10-CM | POA: Diagnosis not present

## 2014-11-26 DIAGNOSIS — R4182 Altered mental status, unspecified: Secondary | ICD-10-CM | POA: Diagnosis not present

## 2014-11-26 NOTE — Telephone Encounter (Signed)
CT head showed stable diffuse atrophy (compared to Nov 2015) with evidence of an old stoke His neurology evaluation reportedly revealed no evidence of dementia with MMSE test administered and reported as 30/30.  If they want a second opinion I can make a referral to [facility]

## 2014-11-26 NOTE — Telephone Encounter (Signed)
Scheduled appointment would like to talk with MD first. Patient wife would like to have B 12 and TSH, as advised by Neurology.

## 2014-11-26 NOTE — Assessment & Plan Note (Addendum)
CT head showed stable diffuse atrophy (compared to Nov 2015) with evidence of an old stoke (lacunar infarct of posterior limb of the  right internal capsule) . Neurology evaluation revealed no evidence of dementia with MMSE reported as 30/30

## 2014-11-26 NOTE — Telephone Encounter (Signed)
Please tell wife that he has already had those done.  I did them in September and they were normal they do not need to be repeated

## 2014-11-27 NOTE — Telephone Encounter (Signed)
Patient notified and voiced understanding.

## 2014-12-03 ENCOUNTER — Other Ambulatory Visit: Payer: Self-pay | Admitting: Internal Medicine

## 2014-12-11 ENCOUNTER — Encounter: Payer: Self-pay | Admitting: Internal Medicine

## 2014-12-19 ENCOUNTER — Encounter: Payer: Self-pay | Admitting: Internal Medicine

## 2014-12-19 ENCOUNTER — Ambulatory Visit (INDEPENDENT_AMBULATORY_CARE_PROVIDER_SITE_OTHER): Payer: Medicare Other | Admitting: Internal Medicine

## 2014-12-19 ENCOUNTER — Encounter (INDEPENDENT_AMBULATORY_CARE_PROVIDER_SITE_OTHER): Payer: Self-pay

## 2014-12-19 VITALS — BP 112/64 | HR 75 | Temp 97.4°F | Resp 16 | Ht 68.0 in | Wt 132.8 lb

## 2014-12-19 DIAGNOSIS — E785 Hyperlipidemia, unspecified: Secondary | ICD-10-CM | POA: Diagnosis not present

## 2014-12-19 DIAGNOSIS — E119 Type 2 diabetes mellitus without complications: Secondary | ICD-10-CM

## 2014-12-19 DIAGNOSIS — S060X0D Concussion without loss of consciousness, subsequent encounter: Secondary | ICD-10-CM

## 2014-12-19 NOTE — Progress Notes (Signed)
Patient ID: Hector Lucero, male   DOB: 11-06-1928, 81 y.o.   MRN: [id number]   Patient Active Problem List   Diagnosis Date Noted  . History of recent fall 09/16/2014  . Concussion without loss of consciousness 09/16/2014  . OSA (obstructive sleep apnea) 05/30/2014  . Cancer of prostate w/low recurrence risk (T1-2a, Gleason<7 & PSA<10) 03/09/2014  . Routine general medical examination at a health care facility 06/23/2013  . Fatigue 05/04/2012  . Diabetes mellitus type 2, diet-controlled 12/24/2011  . Hyperlipidemia with target LDL less than 70 12/24/2011  . Tachycardia 07/30/2011  . Inguinal hernia 06/30/2011    Subjective:  CC:   Chief Complaint  Patient presents with  . Follow-up    CT and [facility] neurology    HPI:   Hector Lucero is a 81 y.o. male who presents for  Follow up on  cognitive decline, as reported by patient's  wife. He was last seen on Dec 16th.   Had CT head which showed diffuse atrophy  And an old lacunar stroke. He was referred to neurology and saw Dr Melrose Nakayama,  on Dec 23,  Who was unable to elicit any diagnosis of dementia, as patient denied any memory deficits and  Scored a 30/30 on the MMSE .  Follow up with family member was suggested in 58 months.    Patient's wife is frustrated.  She cites several examples of patientt getting lost while driving. However every example included driving to an unfamiliar locale,  Not a regularly occurring trip.  Patient still balances his checkbook.  He is no longer somnolent during the day since he started wearing his CPAP  A minimum of 8 hours per night for treatment of severe OSA . Patient's wife also states that he will not take his medications if she does not set them out for him, but he diasagrees.     Past Medical History  Diagnosis Date  . Radial fracture 2006    right, screws and plates  . Ulnar fracture 1995    fall off of ladder  . Hypertension     Past Surgical History  Procedure Laterality Date  . Hernia  repair  Aug 2012    right indirect inguinal, with mesh       The following portions of the patient's history were reviewed and updated as appropriate: Allergies, current medications, and problem list.    Review of Systems:   Patient denies headache, fevers, malaise, unintentional weight loss, skin rash, eye pain, sinus congestion and sinus pain, sore throat, dysphagia,  hemoptysis , cough, dyspnea, wheezing, chest pain, palpitations, orthopnea, edema, abdominal pain, nausea, melena, diarrhea, constipation, flank pain, dysuria, hematuria, urinary  Frequency, nocturia, numbness, tingling, seizures,  Focal weakness, Loss of consciousness,  Tremor, insomnia, depression, anxiety, and suicidal ideation.     History   Social History  . Marital Status: Married    Spouse Name: N/A  . Number of Children: N/A  . Years of Education: N/A   Occupational History  . Retired     Energy manager.   Social History Main Topics  . Smoking status: Never Smoker   . Smokeless tobacco: Never Used  . Alcohol Use: 1.8 oz/week    3 Glasses of wine per week     Comment: occasional  . Drug Use: No  . Sexual Activity: Not Currently   Other Topics Concern  . Not on file   Social History Narrative   Exercises regularly 5  days weekly at the [facility]    Objective:  Filed Vitals:   12/19/14 1618  BP: 112/64  Pulse: 75  Temp: 97.4 F (36.3 C)  Resp: 16     General appearance: alert, cooperative and appears stated age Ears: normal TM's and external ear canals both ears Throat: lips, mucosa, and tongue normal; teeth and gums normal Neck: no adenopathy, no carotid bruit, supple, symmetrical, trachea midline and thyroid not enlarged, symmetric, no tenderness/mass/nodules Back: symmetric, no curvature. ROM normal. No CVA tenderness. Lungs: clear to auscultation bilaterally Heart: regular rate and rhythm, S1, S2 normal, no murmur, click, rub or gallop Abdomen: soft,  non-tender; bowel sounds normal; no masses,  no organomegaly Pulses: 2+ and symmetric Skin: Skin color, texture, turgor normal. No rashes or lesions Lymph nodes: Cervical, supraclavicular, and axillary nodes normal.  Assessment and Plan:  Concussion without loss of consciousness With cognitive changes that have largely resolved, and were initially perceived as dementia.  Patient has had neurology evaluation which failed to find any objecive evidence of dementia.     Updated Medication List Outpatient Encounter Prescriptions as of 12/19/2014  Medication Sig  . aspirin 81 MG tablet Take 81 mg by mouth daily.    Marland Kitchen diltiazem (CARDIZEM) 30 MG tablet TAKE 1 TABLET FOUR TIMES A DAY  . fish oil-omega-3 fatty acids 1000 MG capsule Take 1,000 mg by mouth daily.    . Multiple Vitamins-Minerals (MENS 50+ MULTI VITAMIN/MIN PO) Take 1 tablet by mouth daily.  . Red Yeast Rice Extract ([employer] RED YEAST RICE) 600 MG CAPS Take 1 capsule (600 mg total) by mouth 2 (two) times daily.   A total of 25 minutes of face to face time was spent with patient more than half of which was spent in counselling on the above mentioned issues.   Orders Placed This Encounter  Procedures  . Comprehensive metabolic panel  . Lipid panel  . Hemoglobin A1c    No Follow-up on file.

## 2014-12-21 NOTE — Assessment & Plan Note (Addendum)
With cognitive changes that have largely resolved, and were initially perceived as dementia.  Patient has had neurology evaluation which failed to find any objecive evidence of dementia.

## 2015-01-02 DIAGNOSIS — H34812 Central retinal vein occlusion, left eye: Secondary | ICD-10-CM | POA: Diagnosis not present

## 2015-01-02 DIAGNOSIS — H35352 Cystoid macular degeneration, left eye: Secondary | ICD-10-CM | POA: Diagnosis not present

## 2015-01-14 ENCOUNTER — Telehealth: Payer: Self-pay | Admitting: Internal Medicine

## 2015-01-14 NOTE — Telephone Encounter (Signed)
Sleep med called for copy of office notes reflect CPAP use Note sent from 10/27/14 as requested.

## 2015-01-15 ENCOUNTER — Encounter: Payer: Self-pay | Admitting: Cardiovascular Disease

## 2015-01-15 ENCOUNTER — Ambulatory Visit (INDEPENDENT_AMBULATORY_CARE_PROVIDER_SITE_OTHER): Payer: Medicare Other | Admitting: Cardiovascular Disease

## 2015-01-15 VITALS — BP 130/64 | HR 68 | Ht 68.0 in | Wt 135.5 lb

## 2015-01-15 DIAGNOSIS — G4733 Obstructive sleep apnea (adult) (pediatric): Secondary | ICD-10-CM

## 2015-01-15 DIAGNOSIS — E785 Hyperlipidemia, unspecified: Secondary | ICD-10-CM

## 2015-01-15 DIAGNOSIS — R000 Tachycardia, unspecified: Secondary | ICD-10-CM | POA: Diagnosis not present

## 2015-01-15 DIAGNOSIS — I471 Supraventricular tachycardia: Secondary | ICD-10-CM

## 2015-01-15 DIAGNOSIS — R5383 Other fatigue: Secondary | ICD-10-CM

## 2015-01-15 MED ORDER — DILTIAZEM HCL ER COATED BEADS 120 MG PO CP24: 120.0000 mg | ORAL_CAPSULE | Freq: Every day | ORAL | Status: DC

## 2015-01-15 NOTE — Progress Notes (Signed)
Patient ID: Hector Lucero, male    DOB: 04/14/1928, 81 y.o.   MRN: [id number]  HPI Comments: 44 and yo white male, patient of Dr. Derrel Nip,  with a hx of  Hernia, s/p surgical repair,  noted to be tachycardic during his preoperative evaluation,   sent to ER, noted to have a narrow complex tachycardia with rates in the 160's, given IV metoprolol with acute conversion of his rate to the 70s , sent home with  metoprolol succinate 25 mg daily. He reports that he had an uneventful repair of indirect right inguinal hernia, on Aug 27 by [employer].  He presents today for follow-up of his SVT  Wife presents with him today. She reports that he is tired more. He is not doing as much activity as she would like to do. He does go to the gym 3 days per week He denies any tachycardia concerning for recurrent SVT. He is taking diltiazem 30 mg at least 3 times per day, sometimes four. Weight has been relatively stable. Denies any chest pain or shortness of breath with exertion  Hemoglobin A1c running in the low 6 range, total cholesterol 230 Labs reviewed with him  EKG  on today's visit shows normal sinus rhythm with rate 68 bpm, no significant ST or T-wave changes  Other prior cardiac history Holter monitor x48 hours at that time showed 27 episodes of SVT or atrial tachycardia, the longest episode lasting 36 beats. He was asymptomatic   No Known Allergies  Outpatient Encounter Prescriptions as of 01/15/2015  Medication Sig  . aspirin 81 MG tablet Take 81 mg by mouth daily.    . fish oil-omega-3 fatty acids 1000 MG capsule Take 1,000 mg by mouth daily.    . Multiple Vitamins-Minerals (MENS 50+ MULTI VITAMIN/MIN PO) Take 1 tablet by mouth daily.  . Red Yeast Rice Extract ([employer] RED YEAST RICE) 600 MG CAPS Take 1 capsule (600 mg total) by mouth 2 (two) times daily.  . [DISCONTINUED] diltiazem (CARDIZEM) 30 MG tablet TAKE 1 TABLET FOUR TIMES A DAY  . diltiazem (CARDIZEM CD) 120 MG 24 hr capsule Take 1 capsule  (120 mg total) by mouth daily.    Past Medical History  Diagnosis Date  . Radial fracture 2006    right, screws and plates  . Ulnar fracture 1995    fall off of ladder  . Hypertension     Past Surgical History  Procedure Laterality Date  . Hernia repair  Aug 2012    right indirect inguinal, with mesh    Social History  reports that he has never smoked. He has never used smokeless tobacco. He reports that he drinks about 1.8 oz of alcohol per week. He reports that he does not use illicit drugs.  Family History family history includes Diabetes in his mother.       Review of Systems  Constitutional: Negative.   Respiratory: Negative.   Cardiovascular: Negative.   Gastrointestinal: Negative.   Musculoskeletal: Negative.   Skin: Negative.   Neurological: Negative.   Hematological: Negative.   Psychiatric/Behavioral: Negative.   All other systems reviewed and are negative.   BP 130/64 mmHg  Pulse 68  Ht 5\' 8"  (1.727 m)  Wt 135 lb 8 oz (61.462 kg)  BMI 20.61 kg/m2  Physical Exam  Constitutional: He is oriented to person, place, and time. He appears well-developed and well-nourished.  HENT:  Head: Normocephalic.  Nose: Nose normal.  Mouth/Throat: Oropharynx is clear and moist.  Eyes: Conjunctivae are normal. Pupils are equal, round, and reactive to light.  Neck: Normal range of motion. Neck supple. No JVD present.  Cardiovascular: Normal rate, regular rhythm, S1 normal, S2 normal, normal heart sounds and intact distal pulses.  Exam reveals no gallop and no friction rub.   No murmur heard. Pulmonary/Chest: Effort normal and breath sounds normal. No respiratory distress. He has no wheezes. He has no rales. He exhibits no tenderness.  Abdominal: Soft. Bowel sounds are normal. He exhibits no distension. There is no tenderness.  Musculoskeletal: Normal range of motion. He exhibits no edema or tenderness.  Lymphadenopathy:    He has no cervical adenopathy.   Neurological: He is alert and oriented to person, place, and time. Coordination normal.  Skin: Skin is warm and dry. No rash noted. No erythema.  Psychiatric: He has a normal mood and affect. His behavior is normal. Judgment and thought content normal.      Assessment and Plan   Nursing note and vitals reviewed.

## 2015-01-15 NOTE — Assessment & Plan Note (Signed)
Etiology of his fatigue is unclear. Unable to exclude mild depression. Wife wants him to have more hobbies. Does not read, sits around. Could also consider checking testosterone level on routine follow-up Other lab work normal and was reviewed with him including thyroid and B-12. He does not look anemic

## 2015-01-15 NOTE — Assessment & Plan Note (Signed)
We spent some time talking about his cholesterol which tends to run 230. We did offer a coronary calcium score for risk stratification. If the score is low, could hold off on additional medications. If scores very high, could add a statin. He will think about this and call us if he would like to schedule.

## 2015-01-15 NOTE — Patient Instructions (Addendum)
You are doing well.  Please start diltizem/cardizem ER 120 mg once a day once it comes in the mail Please hold the diltiazem 30 mg pills  Consider a CT coronary calcium score, Call the office if you are interested  Please call us if you have new issues that need to be addressed before your next appt.  Your physician wants you to follow-up in: 12 months.  You will receive a reminder letter in the mail two months in advance. If you don't receive a letter, please call our office to schedule the follow-up appointment.

## 2015-01-15 NOTE — Assessment & Plan Note (Signed)
He is taking diltiazem 30 mg at least 3 times per day, sometimes for. We have recommended he changed to diltiazem ER 120 mg daily for convenience No recent significant arrhythmia

## 2015-01-15 NOTE — Assessment & Plan Note (Signed)
He reports that he is sleeping relatively well, sometimes uses his CPAP. Sleeps the same with or without it Uncertain if this is contributing to his fatigue

## 2015-01-23 ENCOUNTER — Ambulatory Visit (INDEPENDENT_AMBULATORY_CARE_PROVIDER_SITE_OTHER): Payer: Medicare Other | Admitting: Internal Medicine

## 2015-01-23 ENCOUNTER — Encounter: Payer: Self-pay | Admitting: Internal Medicine

## 2015-01-23 VITALS — BP 112/50 | HR 76 | Temp 97.4°F | Resp 16 | Ht 68.0 in | Wt 137.2 lb

## 2015-01-23 DIAGNOSIS — R636 Underweight: Secondary | ICD-10-CM | POA: Diagnosis not present

## 2015-01-23 DIAGNOSIS — Z23 Encounter for immunization: Secondary | ICD-10-CM

## 2015-01-23 DIAGNOSIS — E119 Type 2 diabetes mellitus without complications: Secondary | ICD-10-CM

## 2015-01-23 LAB — COMPREHENSIVE METABOLIC PANEL
ALT: 14 U/L (ref 0–53)
AST: 17 U/L (ref 0–37)
Albumin: 3.8 g/dL (ref 3.5–5.2)
Alkaline Phosphatase: 91 U/L (ref 39–117)
BUN: 16 mg/dL (ref 6–23)
CHLORIDE: 106 meq/L (ref 96–112)
CO2: 27 meq/L (ref 19–32)
CREATININE: 0.76 mg/dL (ref 0.40–1.50)
Calcium: 8.9 mg/dL (ref 8.4–10.5)
GFR: 103.59 mL/min (ref >60.00)
Glucose, Bld: 142 mg/dL — ABNORMAL HIGH (ref 70–99)
POTASSIUM: 3.7 meq/L (ref 3.5–5.1)
SODIUM: 138 meq/L (ref 135–145)
TOTAL PROTEIN: 6.2 g/dL (ref 6.0–8.3)
Total Bilirubin: 0.3 mg/dL (ref 0.2–1.2)

## 2015-01-23 LAB — HEMOGLOBIN A1C: Hgb A1c MFr Bld: 6.2 % (ref 4.6–6.5)

## 2015-01-23 LAB — HM DIABETES FOOT EXAM: HM Diabetic Foot Exam: NORMAL

## 2015-01-23 NOTE — Patient Instructions (Signed)
Diabetes and Standards of Medical Care Diabetes is complicated. You may find that your diabetes team includes a dietitian, nurse, diabetes educator, eye doctor, and more. To help everyone know what is going on and to help you get the care you deserve, the following schedule of care was developed to help keep you on track. Below are the tests, exams, vaccines, medicines, education, and plans you will need. HbA1c test This test shows how well you have controlled your glucose over the past 2-3 months. It is used to see if your diabetes management plan needs to be adjusted.   It is performed at least 2 times a year if you are meeting treatment goals.  It is performed 4 times a year if therapy has changed or if you are not meeting treatment goals. Blood pressure test  This test is performed at every routine medical visit. The goal is less than 140/90 mm Hg for most people, but 130/80 mm Hg in some cases. Ask your health care provider about your goal. Dental exam  Follow up with the dentist regularly. Eye exam  If you are diagnosed with type 1 diabetes as a child, get an exam upon reaching the age of 37 years or older and have had diabetes for 3-5 years. Yearly eye exams are recommended after that initial eye exam.  If you are diagnosed with type 1 diabetes as an adult, get an exam within 5 years of diagnosis and then yearly.  If you are diagnosed with type 2 diabetes, get an exam as soon as possible after the diagnosis and then yearly. Foot care exam  Visual foot exams are performed at every routine medical visit. The exams check for cuts, injuries, or other problems with the feet.  A comprehensive foot exam should be done yearly. This includes visual inspection as well as assessing foot pulses and testing for loss of sensation.  Check your feet nightly for cuts, injuries, or other problems with your feet. Tell your health care provider if anything is not healing. Kidney function test (urine  microalbumin)  This test is performed once a year.  Type 1 diabetes: The first test is performed 5 years after diagnosis.  Type 2 diabetes: The first test is performed at the time of diagnosis.  A serum creatinine and estimated glomerular filtration rate (eGFR) test is done once a year to assess the level of chronic kidney disease (CKD), if present. Lipid profile (cholesterol, HDL, LDL, triglycerides)  Performed every 5 years for most people.  The goal for LDL is less than 100 mg/dL. If you are at high risk, the goal is less than 70 mg/dL.  The goal for HDL is 40 mg/dL-50 mg/dL for men and 50 mg/dL-60 mg/dL for women. An HDL cholesterol of 60 mg/dL or higher gives some protection against heart disease.  The goal for triglycerides is less than 150 mg/dL. Influenza vaccine, pneumococcal vaccine, and hepatitis B vaccine  The influenza vaccine is recommended yearly.  It is recommended that people with diabetes who are over 24 years old get the pneumonia vaccine. In some cases, two separate shots may be given. Ask your health care provider if your pneumonia vaccination is up to date.  The hepatitis B vaccine is also recommended for adults with diabetes. Diabetes self-management education  Education is recommended at diagnosis and ongoing as needed. Treatment plan  Your treatment plan is reviewed at every medical visit. Document Released: 08/23/2009 Document Revised: 03/12/2014 Document Reviewed: 03/28/2013 [facility] Patient Information 2015 [place],  [employer]. This information is not intended to replace advice given to you by your health care provider. Make sure you discuss any questions you have with your health care provider.  

## 2015-01-23 NOTE — Progress Notes (Signed)
Pre-visit discussion using our clinic review tool. No additional management support is needed unless otherwise documented below in the visit note.  

## 2015-01-23 NOTE — Progress Notes (Signed)
Patient ID: Hector Lucero, male   DOB: 07/07/1928, 81 y.o.   MRN: [id number]   Patient Active Problem List   Diagnosis Date Noted  . Underweight 01/26/2015  . SVT (supraventricular tachycardia) 01/15/2015  . History of recent fall 09/16/2014  . Concussion without loss of consciousness 09/16/2014  . OSA (obstructive sleep apnea) 05/30/2014  . Cancer of prostate w/low recurrence risk (T1-2a, Gleason<7 & PSA<10) 03/09/2014  . Routine general medical examination at a health care facility 06/23/2013  . Fatigue 05/04/2012  . Diabetes mellitus type 2, diet-controlled 12/24/2011  . Hyperlipidemia with target LDL less than 70 12/24/2011  . Tachycardia 07/30/2011  . Inguinal hernia 06/30/2011    Subjective:  CC:   Chief Complaint  Patient presents with  . Follow-up    Diet controlled  . Diabetes    HPI:   Hector Lucero is a 81 y.o. male who presents for  follow up on DM type 2, unintentional weight loss and cognitive complaints. He feels generally well, i staking his medication as directed,  And denies any hypoglycemic events.  His wife accompanies him again today and reports that he continues to have episodes of memory lapses that are infrequent.  They appear to bother her although with more discussion she admits that they are minor.  Patient has been seen by Neurology,  MMSE was again normal,  and no diagnosis of dementia has been made. He continues to use his CPAP with nighttime sleep and naps and notes improved energy level.     Past Medical History  Diagnosis Date  . Radial fracture 2006    right, screws and plates  . Ulnar fracture 1995    fall off of ladder  . Hypertension     Past Surgical History  Procedure Laterality Date  . Hernia repair  Aug 2012    right indirect inguinal, with mesh       The following portions of the patient's history were reviewed and updated as appropriate: Allergies, current medications, and problem list.    Review of Systems:   Patient denies headache, fevers, malaise, unintentional weight loss, skin rash, eye pain, sinus congestion and sinus pain, sore throat, dysphagia,  hemoptysis , cough, dyspnea, wheezing, chest pain, palpitations, orthopnea, edema, abdominal pain, nausea, melena, diarrhea, constipation, flank pain, dysuria, hematuria, urinary  Frequency, nocturia, numbness, tingling, seizures,  Focal weakness, Loss of consciousness,  Tremor, insomnia, depression, anxiety, and suicidal ideation.     History   Social History  . Marital Status: Married    Spouse Name: N/A  . Number of Children: N/A  . Years of Education: N/A   Occupational History  . Retired     Energy manager.   Social History Main Topics  . Smoking status: Never Smoker   . Smokeless tobacco: Never Used  . Alcohol Use: 1.8 oz/week    3 Glasses of wine per week     Comment: occasional  . Drug Use: No  . Sexual Activity: Not Currently   Other Topics Concern  . Not on file   Social History Narrative   Exercises regularly 5 days weekly at the [facility]    Objective:  Filed Vitals:   01/23/15 1408  BP: 112/50  Pulse: 76  Temp: 97.4 F (36.3 C)  Resp: 16     General appearance: alert, cooperative and appears stated age Ears: normal TM's and external ear canals both ears Throat: lips, mucosa, and tongue normal; teeth and gums normal  Neck: no adenopathy, no carotid bruit, supple, symmetrical, trachea midline and thyroid not enlarged, symmetric, no tenderness/mass/nodules Back: symmetric, no curvature. ROM normal. No CVA tenderness. Lungs: clear to auscultation bilaterally Heart: regular rate and rhythm, S1, S2 normal, no murmur, click, rub or gallop Abdomen: soft, non-tender; bowel sounds normal; no masses,  no organomegaly Pulses: 2+ and symmetric Skin: Skin color, texture, turgor normal. No rashes or lesions Lymph nodes: Cervical, supraclavicular, and axillary nodes normal.  Assessment and  Plan:  Diabetes mellitus type 2, diet-controlled Well-controlled on diet alone .  hemoglobin A1c has been consistently less than 7.0 . Patient is up-to-date on eye exams and foot exams   There is  no proteinuria on prior micro urinalysis but attempts to repeat this have been hindered by patient state.   Fasting lipids have been repeated and the hypertriglyceridemia seen earlier was due to nonfasting state. continue red yeast rice.   Lab Results  Component Value Date   HGBA1C 6.2 01/23/2015   Lab Results  Component Value Date   MICROALBUR 1.3 03/07/2014   Lab Results  Component Value Date   CHOL 231* 07/19/2014   HDL 65.90 07/19/2014   LDLCALC 133* 07/19/2014   LDLDIRECT 131.2 12/05/2013   TRIG 162.0* 07/19/2014   CHOLHDL 4 07/19/2014            Underweight He has gained 5 lbs over the last 3 months due to improved appetite.     Updated Medication List Outpatient Encounter Prescriptions as of 01/23/2015  Medication Sig  . aspirin 81 MG tablet Take 81 mg by mouth daily.    Marland Kitchen diltiazem (CARDIZEM CD) 120 MG 24 hr capsule Take 1 capsule (120 mg total) by mouth daily.  . fish oil-omega-3 fatty acids 1000 MG capsule Take 1,000 mg by mouth daily.    . Multiple Vitamins-Minerals (MENS 50+ MULTI VITAMIN/MIN PO) Take 1 tablet by mouth daily.  . Red Yeast Rice Extract ([employer] RED YEAST RICE) 600 MG CAPS Take 1 capsule (600 mg total) by mouth 2 (two) times daily.     Orders Placed This Encounter  Procedures  . Pneumococcal conjugate vaccine 13-valent  . Comp Met (CMET)  . HgB A1c  . HM DIABETES EYE EXAM  . HM DIABETES FOOT EXAM  . HM COLONOSCOPY    No Follow-up on file.

## 2015-01-25 ENCOUNTER — Encounter: Payer: Self-pay | Admitting: *Deleted

## 2015-01-26 ENCOUNTER — Encounter: Payer: Self-pay | Admitting: Internal Medicine

## 2015-01-26 DIAGNOSIS — R636 Underweight: Secondary | ICD-10-CM | POA: Insufficient documentation

## 2015-01-26 NOTE — Assessment & Plan Note (Signed)
Well-controlled on diet alone .  hemoglobin A1c has been consistently less than 7.0 . Patient is up-to-date on eye exams and foot exams   There is  no proteinuria on prior micro urinalysis but attempts to repeat this have been hindered by patient state.   Fasting lipids have been repeated and the hypertriglyceridemia seen earlier was due to nonfasting state. continue red yeast rice.   Lab Results  Component Value Date   HGBA1C 6.2 01/23/2015   Lab Results  Component Value Date   MICROALBUR 1.3 03/07/2014   Lab Results  Component Value Date   CHOL 231* 07/19/2014   HDL 65.90 07/19/2014   LDLCALC 133* 07/19/2014   LDLDIRECT 131.2 12/05/2013   TRIG 162.0* 07/19/2014   CHOLHDL 4 07/19/2014

## 2015-01-26 NOTE — Assessment & Plan Note (Signed)
He has gained 5 lbs over the last 3 months due to improved appetite.

## 2015-03-15 DIAGNOSIS — H34812 Central retinal vein occlusion, left eye: Secondary | ICD-10-CM | POA: Diagnosis not present

## 2015-03-19 ENCOUNTER — Ambulatory Visit: Payer: Medicare Other | Admitting: Internal Medicine

## 2015-04-07 IMAGING — CT CT HEAD WITHOUT CONTRAST
1 of 2 series · 15 of 30 positions shown, 19 images · non-contrast
Comparison: September 09, 2014

CLINICAL DATA: Altered mental status and memory loss, progressing

EXAM:
CT HEAD WITHOUT CONTRAST
TECHNIQUE: Contiguous axial images were obtained from the base of the skull
through the vertex without intravenous contrast.

[Series 2: head wo · axial · 0.46mm/px · z∈[-65,+61]mm · 15 of 32 slices shown, 19 images]
[im 2/32  brain]
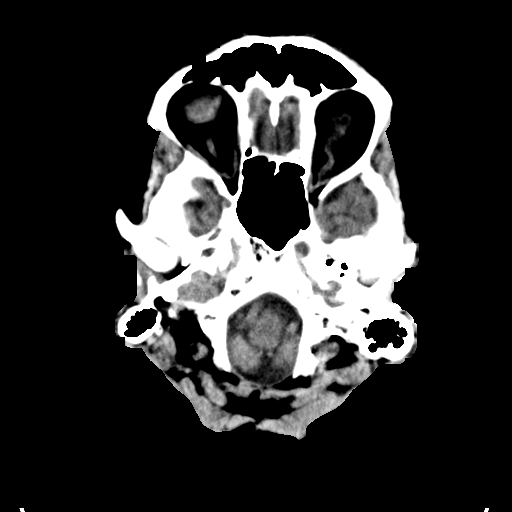
[im 2/32  bone]
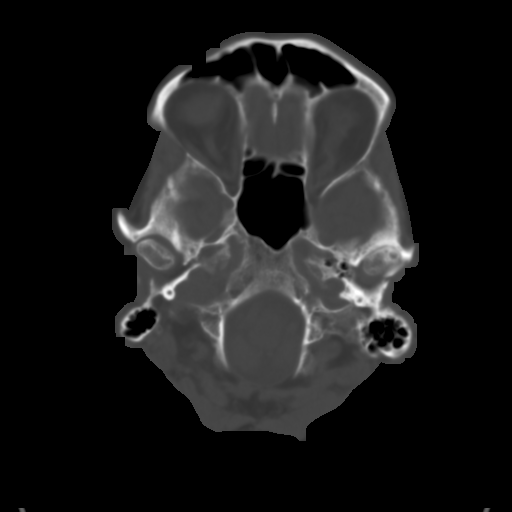
[im 5/32  brain]
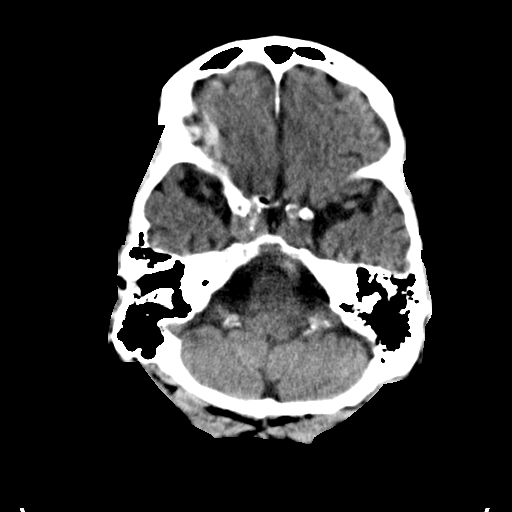
[im 6/32  brain]
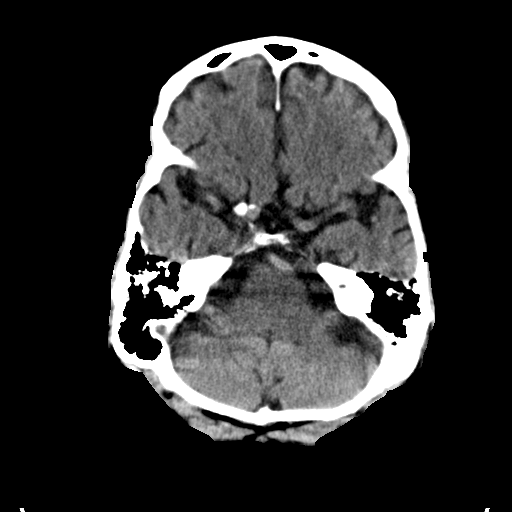
[im 8/32  brain]
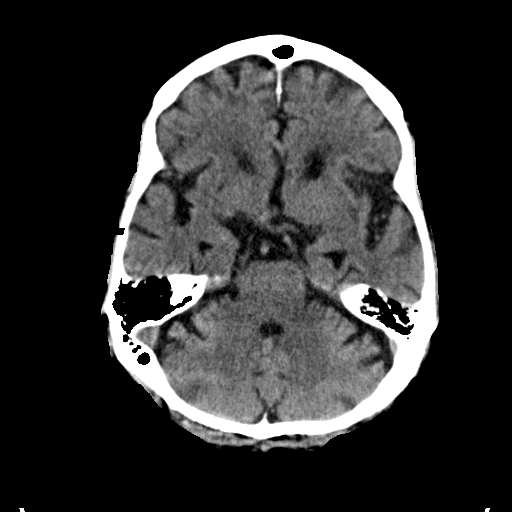
[im 11/32  brain]
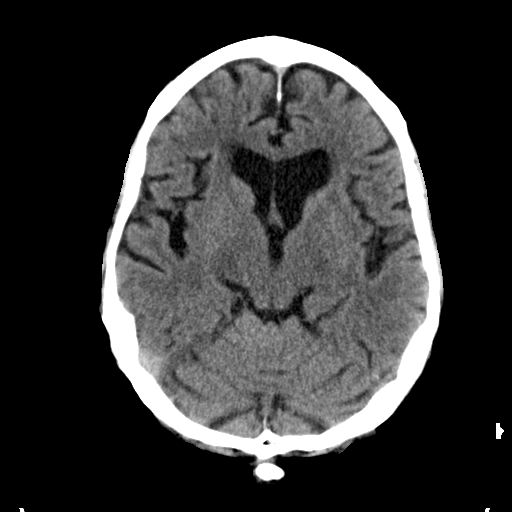
[im 11/32  bone]
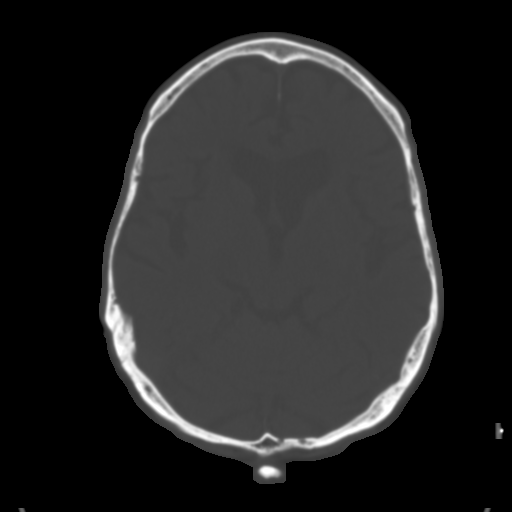
[im 12/32  brain]
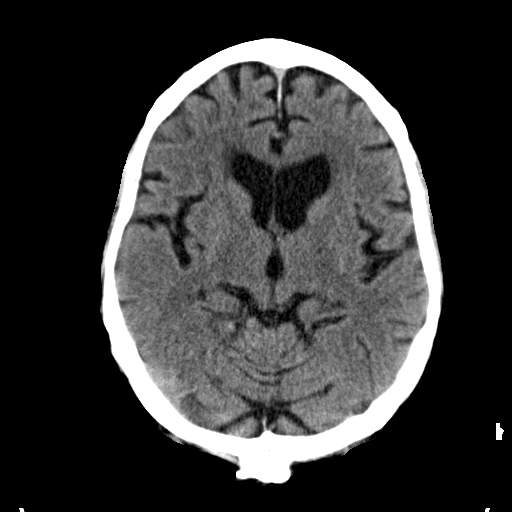
[im 14/32  brain]
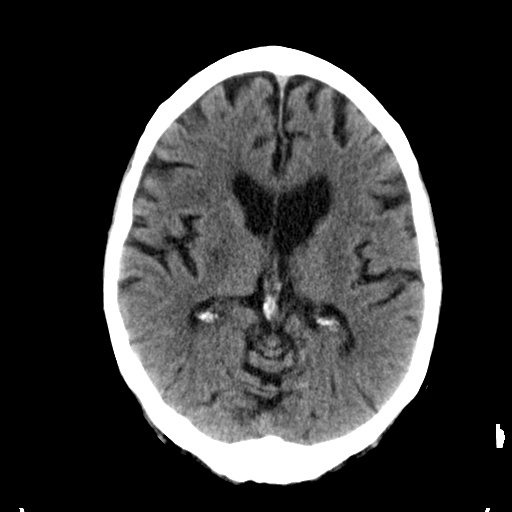
[im 17/32  brain]
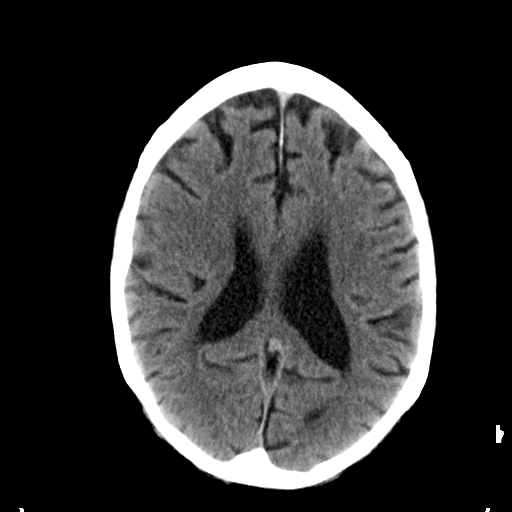
[im 18/32  brain]
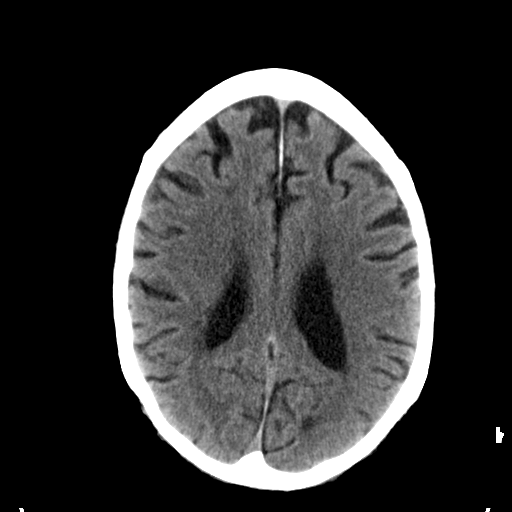
[im 18/32  bone]
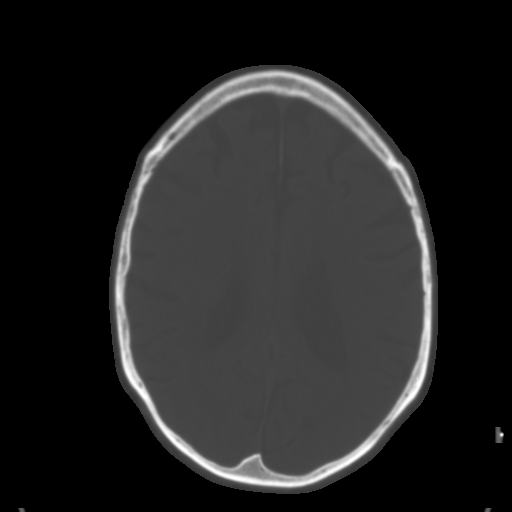
[im 20/32  brain]
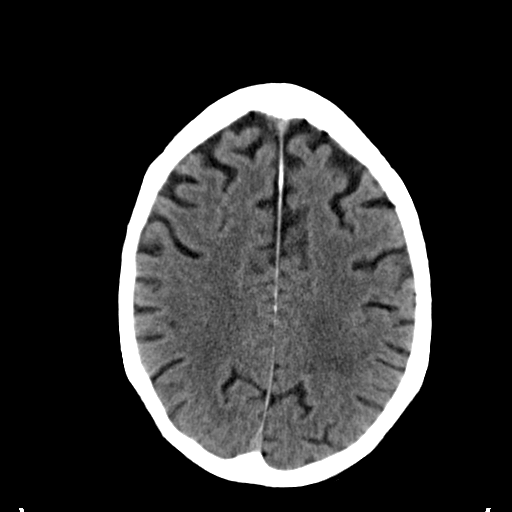
[im 23/32  brain]
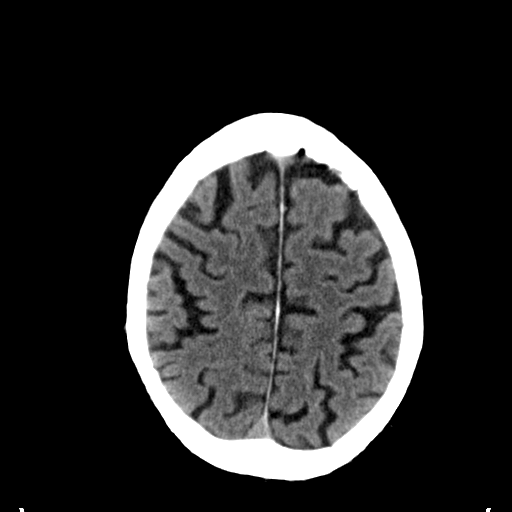
[im 24/32  brain]
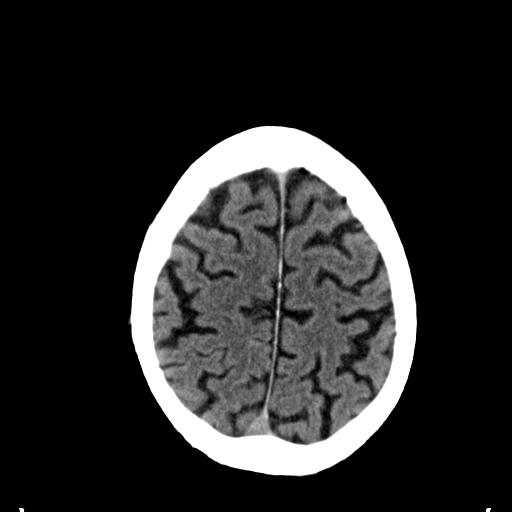
[im 26/32  brain]
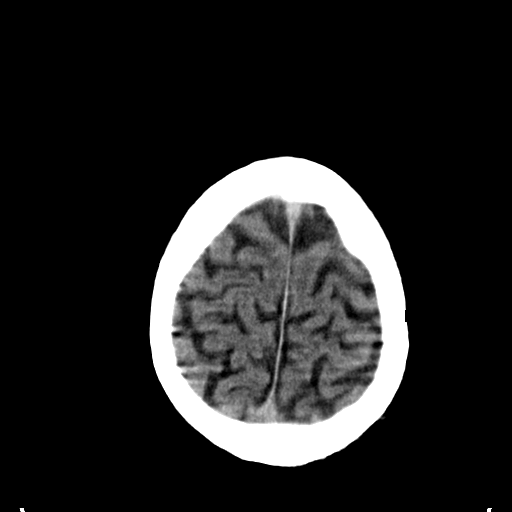
[im 26/32  bone]
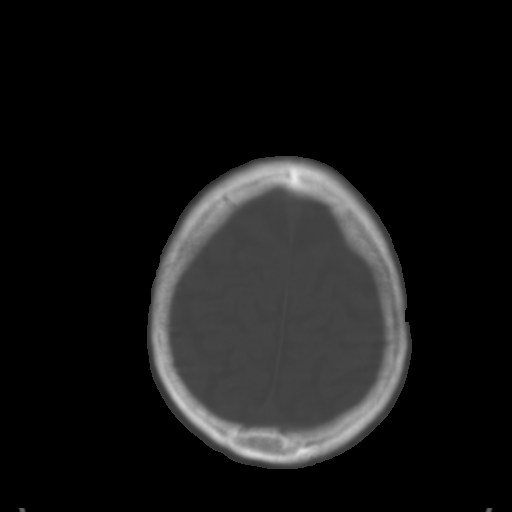
[im 29/32  brain]
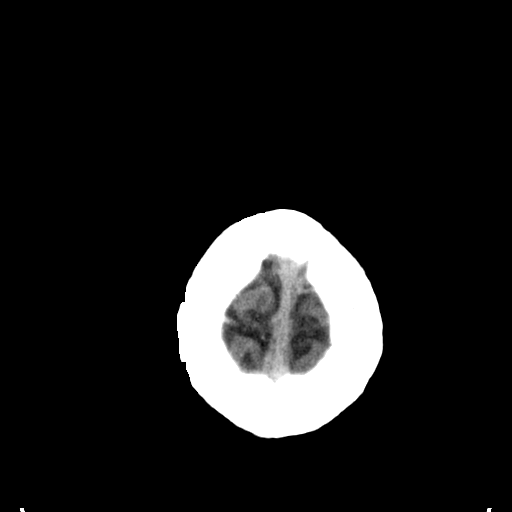
[im 30/32  brain]
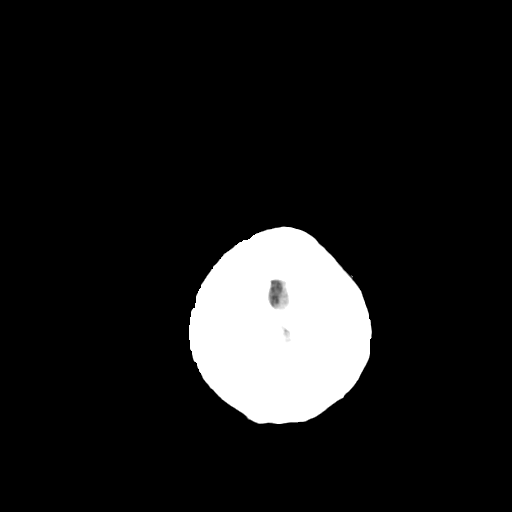

[15 of 30 positions shown; findings below may reference images not displayed]

FINDINGS: Mild diffuse atrophy is stable. There is no intracranial mass,
hemorrhage, extra-axial fluid collection, or midline shift. There is
evidence of a prior small lacunar infarct involving the posterior
limb of the right internal capsule, stable. There is slight small
vessel disease in the centra semiovale bilaterally. There is no new
gray-white compartment lesion. No acute infarct apparent. The bony
calvarium appears intact. The visualized mastoid air cells are
clear.
IMPRESSION: Mild atrophy with mild small vessel disease. Prior stable small
lacunar type infarct in the right internal capsule. No intracranial
mass, hemorrhage, or acute appearing infarct.

## 2015-04-10 ENCOUNTER — Ambulatory Visit (INDEPENDENT_AMBULATORY_CARE_PROVIDER_SITE_OTHER): Payer: Medicare Other | Admitting: Internal Medicine

## 2015-04-10 VITALS — BP 118/66 | HR 71 | Temp 97.5°F | Resp 16 | Ht 68.0 in | Wt 135.1 lb

## 2015-04-10 DIAGNOSIS — G4733 Obstructive sleep apnea (adult) (pediatric): Secondary | ICD-10-CM

## 2015-04-10 DIAGNOSIS — R636 Underweight: Secondary | ICD-10-CM

## 2015-04-10 DIAGNOSIS — R5383 Other fatigue: Secondary | ICD-10-CM

## 2015-04-10 DIAGNOSIS — R4189 Other symptoms and signs involving cognitive functions and awareness: Secondary | ICD-10-CM

## 2015-04-10 MED ORDER — ALPRAZOLAM 0.25 MG PO TABS: 0.2500 mg | ORAL_TABLET | Freq: Two times a day (BID) | ORAL | Status: DC | PRN

## 2015-04-10 NOTE — Patient Instructions (Addendum)
You should walk for 30 minutes daily!!! Minimum ,  Can do twice daily   I am prescribing a light sedative to help you relax before you put your CPAP machine on.  Start with 1/2 tablet , you can repeat the 1/2 tablet after 30 minutes or later in the night if needed to go back to sleep .  Same goes for daytime naps,  You need to use the CPAP for naps as well    You need to gain more weight .  You can eat peanut butter daily  Premier Protein  Makes a chocolate shake that tastes great!  Low carb   . Available at [employer] , [employer] ,  And [employer] is very low carb and high fat/protein and can be eaten daily

## 2015-04-10 NOTE — Progress Notes (Signed)
Subjective:  Patient ID: Hector Lucero, male    DOB: October 09, 1928  Age: 81 y.o. MRN: [id number]   CC: The primary encounter diagnosis was OSA (obstructive sleep apnea). Diagnoses of Other fatigue, Cognitive changes, and Underweight were also pertinent to this visit.  HPI Hector Lucero presents for follow up on chronic conditions including OSA,  Diet controlled DM, SVT,  And hyperlipidemia.  Cc is fatigue. He is mot sleeping well because he is not tolerating CPAP anymore.  Wearing it  all night but tossimg and turing . Using the full face mask,  Which is noisy.  His fatigue has resulted in less daily activity.Recently turned down a leisurely walk through [facility] with a visiting friend. Napping during the day  And going to bed at 8 pm.  His wife remains concerned about his memory lapses indicating that he has dementia, despite Dr. Lannie Fields normal evaluation.  Patient states that he has apologized to his friends for lapses by saying "I have dementia."  He appears a bit browbeaten .      Outpatient Prescriptions Prior to Visit  Medication Sig Dispense Refill  . aspirin 81 MG tablet Take 81 mg by mouth daily.      Marland Kitchen diltiazem (CARDIZEM CD) 120 MG 24 hr capsule Take 1 capsule (120 mg total) by mouth daily. 90 capsule 3  . fish oil-omega-3 fatty acids 1000 MG capsule Take 1,000 mg by mouth daily.      . Multiple Vitamins-Minerals (MENS 50+ MULTI VITAMIN/MIN PO) Take 1 tablet by mouth daily.    . Red Yeast Rice Extract ([employer] RED YEAST RICE) 600 MG CAPS Take 1 capsule (600 mg total) by mouth 2 (two) times daily. 180 capsule 2   No facility-administered medications prior to visit.    Review of Systems;  Patient denies headache, fevers, malaise, unintentional weight loss, skin rash, eye pain, sinus congestion and sinus pain, sore throat, dysphagia,  hemoptysis , cough, dyspnea, wheezing, chest pain, palpitations, orthopnea, edema, abdominal pain, nausea, melena, diarrhea, constipation, flank  pain, dysuria, hematuria, urinary  Frequency, nocturia, numbness, tingling, seizures,  Focal weakness, Loss of consciousness,  Tremor, insomnia, depression, anxiety, and suicidal ideation.      Objective:  BP 118/66 mmHg  Pulse 71  Temp(Src) 97.5 F (36.4 C)  Resp 16  Ht 5\' 8"  (1.727 m)  Wt 135 lb 1.9 oz (61.29 kg)  BMI 20.55 kg/m2  SpO2 95%  BP Readings from Last 3 Encounters:  04/10/15 118/66  01/23/15 112/50  01/15/15 130/64    Wt Readings from Last 3 Encounters:  04/10/15 135 lb 1.9 oz (61.29 kg)  01/23/15 137 lb 4 oz (62.256 kg)  01/15/15 135 lb 8 oz (61.462 kg)    General appearance: alert, cooperative and appears stated age Ears: normal TM's and external ear canals both ears Throat: lips, mucosa, and tongue normal; teeth and gums normal Neck: no adenopathy, no carotid bruit, supple, symmetrical, trachea midline and thyroid not enlarged, symmetric, no tenderness/mass/nodules Back: symmetric, no curvature. ROM normal. No CVA tenderness. Lungs: clear to auscultation bilaterally Heart: regular rate and rhythm, S1, S2 normal, no murmur, click, rub or gallop Abdomen: soft, non-tender; bowel sounds normal; no masses,  no organomegaly Pulses: 2+ and symmetric Skin: Skin color, texture, turgor normal. No rashes or lesions Lymph nodes: Cervical, supraclavicular, and axillary nodes normal.  Lab Results  Component Value Date   HGBA1C 6.2 01/23/2015   HGBA1C 6.2 07/19/2014   HGBA1C 6.0 03/07/2014  Lab Results  Component Value Date   CREATININE 0.76 01/23/2015   CREATININE 0.94 09/09/2014   CREATININE 0.8 07/19/2014    Lab Results  Component Value Date   WBC 8.4 09/09/2014   HGB 13.8 09/09/2014   HCT 41.8 09/09/2014   PLT 184 09/09/2014   GLUCOSE 142* 01/23/2015   CHOL 231* 07/19/2014   TRIG 162.0* 07/19/2014   HDL 65.90 07/19/2014   LDLDIRECT 131.2 12/05/2013   LDLCALC 133* 07/19/2014   ALT 14 01/23/2015   AST 17 01/23/2015   NA 138 01/23/2015   K 3.7  01/23/2015   CL 106 01/23/2015   CREATININE 0.76 01/23/2015   BUN 16 01/23/2015   CO2 27 01/23/2015   TSH 0.95 07/19/2014   HGBA1C 6.2 01/23/2015   MICROALBUR 1.3 03/07/2014    No results found.  Assessment & Plan:   Problem List Items Addressed This Visit    Fatigue    Secondary to interrupted sleep due to intolerance of CPAP.  Trial of alprazolam.  Encouraged to start walking daily       OSA (obstructive sleep apnea) - Primary    Adding alprazolam to evening regimen to help him relax and tolerate the CPAP      Cognitive changes    He has had neurology evaluation and no signs of dementia seen.  Encouraged wife stop emphasizing his mental slips as he is bound to have a self fulfilling prophecy. i       Underweight     I have reviewed his diet and recommended that he increase his protein and fat intake while monitoring his carbohydrates.        A total of 25 minutes of face to face time was spent with patient more than half of which was spent in counselling about the above mentioned conditions  and coordination of care   I am having Hector Lucero maintain his fish oil-omega-3 fatty acids, aspirin, Red Yeast Rice Extract, Multiple Vitamins-Minerals (MENS 50+ MULTI VITAMIN/MIN PO), diltiazem, and ALPRAZolam.  Meds ordered this encounter  Medications  . DISCONTD: ALPRAZolam (XANAX) 0.25 MG tablet    Sig: Take 1 tablet (0.25 mg total) by mouth 2 (two) times daily as needed for anxiety.    Dispense:  30 tablet    Refill:  0  . ALPRAZolam (XANAX) 0.25 MG tablet    Sig: Take 1 tablet (0.25 mg total) by mouth 2 (two) times daily as needed for anxiety.    Dispense:  60 tablet    Refill:  0    Medications Discontinued During This Encounter  Medication Reason  . ALPRAZolam (XANAX) 0.25 MG tablet Reorder    Follow-up: Return in about 4 weeks (around 05/08/2015).   Crecencio Mc, MD

## 2015-04-11 NOTE — Assessment & Plan Note (Signed)
Adding alprazolam to evening regimen to help him relax and tolerate the CPAP

## 2015-04-11 NOTE — Assessment & Plan Note (Addendum)
Secondary to interrupted sleep due to intolerance of CPAP.  Trial of alprazolam.  Encouraged to start walking daily

## 2015-04-11 NOTE — Assessment & Plan Note (Signed)
He has had neurology evaluation and no signs of dementia seen.  Encouraged wife stop emphasizing his mental slips as he is bound to have a self fulfilling prophecy. i

## 2015-04-11 NOTE — Assessment & Plan Note (Signed)
I have reviewed his diet and recommended that he increase his protein and fat intake while monitoring his carbohydrates.

## 2015-04-19 ENCOUNTER — Telehealth: Payer: Self-pay | Admitting: Internal Medicine

## 2015-04-19 NOTE — Telephone Encounter (Signed)
[facility]  Patient Name: Hector Lucero  DOB: 12/08/1927    Initial Comment Caller States he has been having diarrhea for the past 3 days but today he has gone 4x.    Nurse Assessment  Nurse: Justine Null, RN, Rodena Piety Date/Time (Eastern Time): 04/19/2015 2:07:39 PM  Confirm and document reason for call. If symptomatic, describe symptoms. ---Caller States he has been having diarrhea for the past 3 days but today he has gone 4x. caller stated that he has had no vomiting and no fevers and has been and has been having watery stools at times and each yesterday and has been X 5 and has been X4 today and has been having no stomach cramping and has been on no abx  Has the patient traveled out of the country within the last 30 days? ---No  Does the patient require triage? ---Yes  Related visit to physician within the last 2 weeks? ---Yes  Does the PT have any chronic conditions? (i.e. diabetes, asthma, etc.) ---No     Guidelines    Guideline Title Affirmed Question Affirmed Notes  Diarrhea [1] Age > 60 years AND [2] > 6 diarrhea stools in past 24 hours    Final Disposition User   See Physician within 4 Hours (or PCP triage) Justine Null, RN, Rodena Piety

## 2015-04-19 NOTE — Telephone Encounter (Signed)
Left message  For patient to return call to office.

## 2015-04-26 ENCOUNTER — Ambulatory Visit: Payer: Medicare Other | Admitting: Internal Medicine

## 2015-05-01 ENCOUNTER — Telehealth: Payer: Self-pay | Admitting: *Deleted

## 2015-05-01 NOTE — Telephone Encounter (Signed)
Pt called, left message on VM and then came into the clinic; states he has had 8 loose bowel movements today.  Pt thought it was from his Alprazolam he started on 6.2.16.  Advised pt that diarrhea is not a side effect of Alprazolam.  Suggested to pt that he be seen in Acute Care for evaluation.  Pt verbalized understanding

## 2015-05-10 ENCOUNTER — Encounter: Payer: Self-pay | Admitting: *Deleted

## 2015-05-10 DIAGNOSIS — E119 Type 2 diabetes mellitus without complications: Secondary | ICD-10-CM | POA: Diagnosis not present

## 2015-05-10 DIAGNOSIS — H34812 Central retinal vein occlusion, left eye: Secondary | ICD-10-CM | POA: Diagnosis not present

## 2015-05-10 LAB — HM DIABETES EYE EXAM

## 2015-05-27 ENCOUNTER — Telehealth: Payer: Self-pay

## 2015-05-27 NOTE — Telephone Encounter (Signed)
Pt called, states his has a "balance" problem. Denies any CP, SOB N/V. States he had a stroke about 9 months ago. He is going to call his PCP, but just wanted to let us know what is going on. He did schedule an appt with  DR. Gollan at next available .

## 2015-05-27 NOTE — Telephone Encounter (Signed)
Per wife, pt not at home S/w wife who states husband says he has balance problems but she has not noticed. States he is tired all the time. Confirmed 7/25 Tullo appt and 9/9 Gollan appt. Informed pt wife to have him call back if he would like to discuss further.

## 2015-05-28 ENCOUNTER — Ambulatory Visit: Payer: Medicare Other | Admitting: Internal Medicine

## 2015-06-03 ENCOUNTER — Ambulatory Visit (INDEPENDENT_AMBULATORY_CARE_PROVIDER_SITE_OTHER): Payer: Medicare Other | Admitting: Internal Medicine

## 2015-06-03 ENCOUNTER — Encounter: Payer: Self-pay | Admitting: *Deleted

## 2015-06-03 ENCOUNTER — Encounter: Payer: Self-pay | Admitting: Internal Medicine

## 2015-06-03 VITALS — BP 118/60 | HR 85 | Temp 97.7°F | Resp 12 | Ht 68.0 in | Wt 142.8 lb

## 2015-06-03 DIAGNOSIS — R42 Dizziness and giddiness: Secondary | ICD-10-CM

## 2015-06-03 NOTE — Progress Notes (Signed)
Subjective:  Patient ID: Hector Lucero, male    DOB: 1928-07-29  Age: 81 y.o. MRN: [id number]  CC: The encounter diagnosis was Dizziness and giddiness.  HPI Hector Lucero presents for fatigue and loss of balance.  Symptoms have been present for 6 months.  "I seem to be off balance" but he denies true vertigo and has no history of recent falls.  The episodes are not triggered by standing up suddenly.   Has been doing a little gardening in the heat and  wife reports he has not been drinking enough water .   No change in stools.    Orthostatics VS checked today note that he has a systolic rise from 664 to 130 with standing,    Sleeping 9 hours,  One nap during the day  Usually for 45 minutes . He is usually  In bed by 8 pm.      Outpatient Prescriptions Prior to Visit  Medication Sig Dispense Refill  . aspirin 81 MG tablet Take 81 mg by mouth daily.      Marland Kitchen diltiazem (CARDIZEM CD) 120 MG 24 hr capsule Take 1 capsule (120 mg total) by mouth daily. 90 capsule 3  . fish oil-omega-3 fatty acids 1000 MG capsule Take 1,000 mg by mouth daily.      . Multiple Vitamins-Minerals (MENS 50+ MULTI VITAMIN/MIN PO) Take 1 tablet by mouth daily.    . Red Yeast Rice Extract ([employer] RED YEAST RICE) 600 MG CAPS Take 1 capsule (600 mg total) by mouth 2 (two) times daily. 180 capsule 2  . ALPRAZolam (XANAX) 0.25 MG tablet Take 1 tablet (0.25 mg total) by mouth 2 (two) times daily as needed for anxiety. (Patient not taking: Reported on 06/03/2015) 60 tablet 0   No facility-administered medications prior to visit.    Review of Systems;  Patient denies headache, fevers, malaise, unintentional weight loss, skin rash, eye pain, sinus congestion and sinus pain, sore throat, dysphagia,  hemoptysis , cough, dyspnea, wheezing, chest pain, palpitations, orthopnea, edema, abdominal pain, nausea, melena, diarrhea, constipation, flank pain, dysuria, hematuria, urinary  Frequency, nocturia, numbness, tingling, seizures,   Focal weakness, Loss of consciousness,  Tremor, insomnia, depression, anxiety, and suicidal ideation.      Objective:  BP 118/60 mmHg  Pulse 85  Temp(Src) 97.7 F (36.5 C) (Oral)  Resp 12  Ht 5\' 8"  (1.727 m)  Wt 142 lb 12 oz (64.751 kg)  BMI 21.71 kg/m2  SpO2 97%  BP Readings from Last 3 Encounters:  06/03/15 118/60  04/10/15 118/66  01/23/15 112/50    Wt Readings from Last 3 Encounters:  06/03/15 142 lb 12 oz (64.751 kg)  04/10/15 135 lb 1.9 oz (61.29 kg)  01/23/15 137 lb 4 oz (62.256 kg)    General appearance: alert, cooperative and appears stated age  Neck: no adenopathy, no carotid bruit, supple, symmetrical, trachea midline and thyroid not enlarged, symmetric, no tenderness/mass/nodules Back: symmetric, no curvature. ROM normal. No CVA tenderness. Lungs: clear to auscultation bilaterally Heart: regular rate and rhythm, S1, S2 normal, no murmur, click, rub or gallop Abdomen: soft, non-tender; bowel sounds normal; no masses,  no organomegaly Pulses: 2+ and symmetric Skin: Skin color, texture, turgor normal. No rashes or lesions Lymph nodes: Cervical, supraclavicular, and axillary nodes normal. Neuro: CNs 2-12 intact. DTRs 2+/4 in biceps, brachioradialis, patellars and achilles. Muscle strength 5/5 in upper and lower exremities. Fine resting tremor bilaterally both hands cerebellar function normal. Romberg negative.  No pronator drift.  Gait normal.   Lab Results  Component Value Date   HGBA1C 6.2 01/23/2015   HGBA1C 6.2 07/19/2014   HGBA1C 6.0 03/07/2014    Lab Results  Component Value Date   CREATININE 0.76 01/23/2015   CREATININE 0.94 09/09/2014   CREATININE 0.8 07/19/2014    Lab Results  Component Value Date   WBC 8.4 09/09/2014   HGB 13.8 09/09/2014   HCT 41.8 09/09/2014   PLT 184 09/09/2014   GLUCOSE 142* 01/23/2015   CHOL 231* 07/19/2014   TRIG 162.0* 07/19/2014   HDL 65.90 07/19/2014   LDLDIRECT 131.2 12/05/2013   LDLCALC 133* 07/19/2014    ALT 14 01/23/2015   AST 17 01/23/2015   NA 138 01/23/2015   K 3.7 01/23/2015   CL 106 01/23/2015   CREATININE 0.76 01/23/2015   BUN 16 01/23/2015   CO2 27 01/23/2015   TSH 0.95 07/19/2014   HGBA1C 6.2 01/23/2015   MICROALBUR 1.3 03/07/2014    No results found.  Assessment & Plan:   Problem List Items Addressed This Visit      Unprioritized   Dizziness and giddiness - Primary    He is not orthostatic by my exam today.  Neurologic exam is normal except for mild cognitive changes which have been investigated previously.  Advised to consider Silver Sneakers or similar age appropriate exercise class to improve core strength and balance. Marcelina Morel advised to increase his hydration .  If no improvement inn a few months,  c onsider MRI   Lab Results  Component Value Date   WBC 8.4 09/09/2014   HGB 13.8 09/09/2014   HCT 41.8 09/09/2014   MCV 95 09/09/2014   PLT 184 09/09/2014           A total of 25 minutes of face to face time was spent with patient more than half of which was spent in counselling about the above mentioned conditions  and coordination of care   I am having Mr. Barge maintain his fish oil-omega-3 fatty acids, aspirin, Red Yeast Rice Extract, Multiple Vitamins-Minerals (MENS 50+ MULTI VITAMIN/MIN PO), diltiazem, and ALPRAZolam.  No orders of the defined types were placed in this encounter.    There are no discontinued medications.  Follow-up: No Follow-up on file.   Crecencio Mc, MD

## 2015-06-03 NOTE — Patient Instructions (Signed)
You need to drink a minimum of 48 ounces of water daily ( that's 3   16 ounce servings) to maintain adequate hydration  I recommend  Taking a Sliver sneakers exercise class on a regular basis to help strengthen your "core muscles"    We will get an MRI of your brain in a few weeks if the symptoms do not improve   Dizziness Dizziness is a common problem. It is a feeling of unsteadiness or light-headedness. You may feel like you are about to faint. Dizziness can lead to injury if you stumble or fall. A person of any age group can suffer from dizziness, but dizziness is more common in older adults. CAUSES  Dizziness can be caused by many different things, including:  Middle ear problems.  Standing for too long.  Infections.  An allergic reaction.  Aging.  An emotional response to something, such as the sight of blood.  Side effects of medicines.  Tiredness.  Problems with circulation or blood pressure.  Excessive use of alcohol or medicines, or illegal drug use.  Breathing too fast (hyperventilation).  An irregular heart rhythm (arrhythmia).  A low red blood cell count (anemia).  Pregnancy.  Vomiting, diarrhea, fever, or other illnesses that cause body fluid loss (dehydration).  Diseases or conditions such as Parkinson's disease, high blood pressure (hypertension), diabetes, and thyroid problems.  Exposure to extreme heat. DIAGNOSIS  Your health care provider will ask about your symptoms, perform a physical exam, and perform an electrocardiogram (ECG) to record the electrical activity of your heart. Your health care provider may also perform other heart or blood tests to determine the cause of your dizziness. These may include:  Transthoracic echocardiogram (TTE). During echocardiography, sound waves are used to evaluate how blood flows through your heart.  Transesophageal echocardiogram (TEE).  Cardiac monitoring. This allows your health care provider to monitor your  heart rate and rhythm in real time.  Holter monitor. This is a portable device that records your heartbeat and can help diagnose heart arrhythmias. It allows your health care provider to track your heart activity for several days if needed.  Stress tests by exercise or by giving medicine that makes the heart beat faster. TREATMENT  Treatment of dizziness depends on the cause of your symptoms and can vary greatly. HOME CARE INSTRUCTIONS   Drink enough fluids to keep your urine clear or pale yellow. This is especially important in very hot weather. In older adults, it is also important in cold weather.  Take your medicine exactly as directed if your dizziness is caused by medicines. When taking blood pressure medicines, it is especially important to get up slowly.  Rise slowly from chairs and steady yourself until you feel okay.  In the morning, first sit up on the side of the bed. When you feel okay, stand slowly while holding onto something until you know your balance is fine.  Move your legs often if you need to stand in one place for a long time. Tighten and relax your muscles in your legs while standing.  Have someone stay with you for 1-2 days if dizziness continues to be a problem. Do this until you feel you are well enough to stay alone. Have the person call your health care provider if he or she notices changes in you that are concerning.  Do not drive or use heavy machinery if you feel dizzy.  Do not drink alcohol. SEEK IMMEDIATE MEDICAL CARE IF:   Your dizziness or light-headedness  gets worse.  You feel nauseous or vomit.  You have problems talking, walking, or using your arms, hands, or legs.  You feel weak.  You are not thinking clearly or you have trouble forming sentences. It may take a friend or family member to notice this.  You have chest pain, abdominal pain, shortness of breath, or sweating.  Your vision changes.  You notice any bleeding.  You have side  effects from medicine that seems to be getting worse rather than better. MAKE SURE YOU:   Understand these instructions.  Will watch your condition.  Will get help right away if you are not doing well or get worse. Document Released: 04/21/2001 Document Revised: 10/31/2013 Document Reviewed: 05/15/2011 [facility] Patient Information 2015 [place], [place]. This information is not intended to replace advice given to you by your health care provider. Make sure you discuss any questions you have with your health care provider.

## 2015-06-03 NOTE — Progress Notes (Signed)
Pre-visit discussion using our clinic review tool. No additional management support is needed unless otherwise documented below in the visit note.  

## 2015-06-04 DIAGNOSIS — R42 Dizziness and giddiness: Secondary | ICD-10-CM | POA: Insufficient documentation

## 2015-06-04 NOTE — Assessment & Plan Note (Addendum)
He is not orthostatic by my exam today.  Neurologic exam is normal except for mild cognitive changes which have been investigated previously.  Advised to consider Silver Sneakers or similar age appropriate exercise class to improve core strength and balance. Marcelina Morel advised to increase his hydration .  If no improvement inn a few months,  c onsider MRI   Lab Results  Component Value Date   WBC 8.4 09/09/2014   HGB 13.8 09/09/2014   HCT 41.8 09/09/2014   MCV 95 09/09/2014   PLT 184 09/09/2014

## 2015-07-05 DIAGNOSIS — H34812 Central retinal vein occlusion, left eye: Secondary | ICD-10-CM | POA: Diagnosis not present

## 2015-07-18 ENCOUNTER — Encounter: Payer: Self-pay | Admitting: Cardiovascular Disease

## 2015-07-18 ENCOUNTER — Ambulatory Visit (INDEPENDENT_AMBULATORY_CARE_PROVIDER_SITE_OTHER): Payer: Medicare Other | Admitting: Cardiovascular Disease

## 2015-07-18 VITALS — BP 110/64 | HR 81 | Ht 69.0 in | Wt 143.0 lb

## 2015-07-18 DIAGNOSIS — I471 Supraventricular tachycardia, unspecified: Secondary | ICD-10-CM

## 2015-07-18 DIAGNOSIS — E785 Hyperlipidemia, unspecified: Secondary | ICD-10-CM

## 2015-07-18 DIAGNOSIS — E119 Type 2 diabetes mellitus without complications: Secondary | ICD-10-CM | POA: Diagnosis not present

## 2015-07-18 DIAGNOSIS — G4733 Obstructive sleep apnea (adult) (pediatric): Secondary | ICD-10-CM

## 2015-07-18 DIAGNOSIS — R42 Dizziness and giddiness: Secondary | ICD-10-CM

## 2015-07-18 NOTE — Patient Instructions (Signed)
You are doing well. No medication changes were made.  We will schedule labs in December (lipids/liver)  Please call us if you have new issues that need to be addressed before your next appt.  Your physician wants you to follow-up in: 12 months.  You will receive a reminder letter in the mail two months in advance. If you don't receive a letter, please call our office to schedule the follow-up appointment.

## 2015-07-18 NOTE — Assessment & Plan Note (Signed)
Recommended we recheck his cholesterol level in 3 months time Recently increased his red yeast rice up to 2 pills per day Again mentioned coronary calcium score for risk stratification. He is not particularly interested at this time

## 2015-07-18 NOTE — Assessment & Plan Note (Signed)
He does not appreciate any arrhythmia since his last clinic visit, at least nothing of significance. We will continue current dose of diltiazem. Mention he could take 30 mg pill as needed for breakthrough arrhythmia. He does not see a need for this

## 2015-07-18 NOTE — Assessment & Plan Note (Signed)
He is not tolerating Cpap

## 2015-07-18 NOTE — Assessment & Plan Note (Signed)
We have encouraged continued exercise, careful diet management in an effort to lose weight. 

## 2015-07-18 NOTE — Assessment & Plan Note (Signed)
Denies having significant symptoms on today's visit. Blood pressure is borderline low. Recommended he check his blood pressure if he has continued symptoms. If the does have future symptoms of orthostasis, may need to decrease the dose of diltiazem

## 2015-07-18 NOTE — Progress Notes (Signed)
Patient ID: Hector Lucero, male    DOB: [birth year], 81 y.o.   MRN: [id number]  HPI Comments: 81 yo male, patient of Dr. Derrel Nip,  with a hx of  Hernia, s/p surgical repair,  noted to be tachycardic during his preoperative evaluation,   sent to ER, noted to have a narrow complex tachycardia with rates in the 160's, given IV metoprolol with acute conversion of his rate to the 70s , sent home with  metoprolol succinate 25 mg daily. He reports that he had an uneventful repair of indirect right inguinal hernia, on Aug 27 by [employer] Founds.  He presents today for follow-up of his SVT  On his last clinic visit he was started on diltiazem extended release 120 mg daily On this regimen he denies any further arrhythmia Overall feels well. Sometimes with some balance issues. Go swimming 2 days per week, walks 1 mile daily Denies any chest pain, shortness of breath with exertion, leg edema Hemoglobin A1c 6, total cholesterol 230  EKG  on today's visit shows normal sinus rhythm with rate 79 bpm, no significant ST or T-wave changes  Other prior cardiac history Holter monitor x48 hours at that time showed 27 episodes of SVT or atrial tachycardia, the longest episode lasting 36 beats. He was asymptomatic   No Known Allergies  Outpatient Encounter Prescriptions as of 07/18/2015  Medication Sig  . aspirin 81 MG tablet Take 81 mg by mouth daily.    Marland Kitchen diltiazem (CARDIZEM CD) 120 MG 24 hr capsule Take 1 capsule (120 mg total) by mouth daily.  . fish oil-omega-3 fatty acids 1000 MG capsule Take 1,000 mg by mouth daily.    . Multiple Vitamins-Minerals (MENS 50+ MULTI VITAMIN/MIN PO) Take 1 tablet by mouth daily.  . Red Yeast Rice Extract ([employer]S RED YEAST RICE) 600 MG CAPS Take 1 capsule (600 mg total) by mouth 2 (two) times daily.  . [DISCONTINUED] ALPRAZolam (XANAX) 0.25 MG tablet Take 1 tablet (0.25 mg total) by mouth 2 (two) times daily as needed for anxiety. (Patient not taking: Reported on 07/18/2015)   No  facility-administered encounter medications on file as of 07/18/2015.    Past Medical History  Diagnosis Date  . Radial fracture 2006    right, screws and plates  . Ulnar fracture 1995    fall off of ladder  . Hypertension     Past Surgical History  Procedure Laterality Date  . Hernia repair  Aug 2012    right indirect inguinal, with mesh    Social History  reports that he has never smoked. He has never used smokeless tobacco. He reports that he drinks about 1.8 oz of alcohol per week. He reports that he does not use illicit drugs.  Family History family history includes Diabetes in his mother.       Review of Systems  Constitutional: Negative.   Respiratory: Negative.   Cardiovascular: Negative.   Gastrointestinal: Negative.   Musculoskeletal: Positive for gait problem.  Neurological: Negative.   Hematological: Negative.   Psychiatric/Behavioral: Negative.   All other systems reviewed and are negative.   BP 110/64 mmHg  Pulse 81  Ht 5\' 9"  (1.753 m)  Wt 143 lb (64.864 kg)  BMI 21.11 kg/m2  Physical Exam  Constitutional: He is oriented to person, place, and time. He appears well-developed and well-nourished.  HENT:  Head: Normocephalic.  Nose: Nose normal.  Mouth/Throat: Oropharynx is clear and moist.  Eyes: Conjunctivae are normal. Pupils are equal, round, and reactive  to light.  Neck: Normal range of motion. Neck supple. No JVD present.  Cardiovascular: Normal rate, regular rhythm, S1 normal, S2 normal, normal heart sounds and intact distal pulses.  Exam reveals no gallop and no friction rub.   No murmur heard. Pulmonary/Chest: Effort normal and breath sounds normal. No respiratory distress. He has no wheezes. He has no rales. He exhibits no tenderness.  Abdominal: Soft. Bowel sounds are normal. He exhibits no distension. There is no tenderness.  Musculoskeletal: Normal range of motion. He exhibits no edema or tenderness.  Lymphadenopathy:    He has no  cervical adenopathy.  Neurological: He is alert and oriented to person, place, and time. Coordination normal.  Skin: Skin is warm and dry. No rash noted. No erythema.  Psychiatric: He has a normal mood and affect. His behavior is normal. Judgment and thought content normal.      Assessment and Plan   Nursing note and vitals reviewed.

## 2015-07-19 ENCOUNTER — Telehealth: Payer: Self-pay | Admitting: Internal Medicine

## 2015-07-19 DIAGNOSIS — Z961 Presence of intraocular lens: Secondary | ICD-10-CM | POA: Diagnosis not present

## 2015-07-19 MED ORDER — ALPRAZOLAM 0.25 MG PO TABS: 0.2500 mg | ORAL_TABLET | Freq: Every evening | ORAL | Status: DC | PRN

## 2015-07-19 NOTE — Telephone Encounter (Signed)
He was referring to alprazolam,  To help him get used to his CPAP mask,  Please fax or call to pharmacy

## 2015-07-19 NOTE — Telephone Encounter (Signed)
Called into pharmacy

## 2015-07-19 NOTE — Telephone Encounter (Signed)
Rx for Xanax called in.

## 2015-07-19 NOTE — Telephone Encounter (Signed)
Please see below note

## 2015-07-19 NOTE — Telephone Encounter (Signed)
Pt came in looking for a Rx for medication to help him sleep with the sleep mask. Pt states he was told to come in and pick up. No Rx up front. Pt needs it before Monday at the end of the day. Thank You!

## 2015-08-10 DIAGNOSIS — R55 Syncope and collapse: Secondary | ICD-10-CM

## 2015-08-10 HISTORY — DX: Syncope and collapse: R55

## 2015-08-15 ENCOUNTER — Ambulatory Visit (INDEPENDENT_AMBULATORY_CARE_PROVIDER_SITE_OTHER): Payer: Medicare Other | Admitting: Internal Medicine

## 2015-08-15 ENCOUNTER — Encounter: Payer: Self-pay | Admitting: Internal Medicine

## 2015-08-15 VITALS — BP 118/60 | HR 79 | Temp 98.2°F | Ht 69.0 in | Wt 140.0 lb

## 2015-08-15 DIAGNOSIS — G4733 Obstructive sleep apnea (adult) (pediatric): Secondary | ICD-10-CM | POA: Diagnosis not present

## 2015-08-15 DIAGNOSIS — C61 Malignant neoplasm of prostate: Secondary | ICD-10-CM

## 2015-08-15 DIAGNOSIS — R000 Tachycardia, unspecified: Secondary | ICD-10-CM | POA: Diagnosis not present

## 2015-08-15 DIAGNOSIS — R413 Other amnesia: Secondary | ICD-10-CM | POA: Diagnosis not present

## 2015-08-15 DIAGNOSIS — R4189 Other symptoms and signs involving cognitive functions and awareness: Secondary | ICD-10-CM

## 2015-08-15 DIAGNOSIS — E119 Type 2 diabetes mellitus without complications: Secondary | ICD-10-CM

## 2015-08-15 NOTE — Assessment & Plan Note (Signed)
Did see Dr Bernardo Heater earlier this year

## 2015-08-15 NOTE — Assessment & Plan Note (Signed)
Sleeping well now ?

## 2015-08-15 NOTE — Assessment & Plan Note (Signed)
Will recheck A1c

## 2015-08-15 NOTE — Assessment & Plan Note (Signed)
No symptoms on the diltiazem

## 2015-08-15 NOTE — Patient Instructions (Signed)
Please stop the red yeast rice.

## 2015-08-15 NOTE — Assessment & Plan Note (Signed)
They are both very concerned about this Reviewed Dr Lannie Fields evaluation--he is highly educated so MMSE may not be as sensitive CT of head showed 1 area of past CVA--could have some cognitive decline from this No mood issues and sleeps adequately  Probably mild cognitive impairment Will recheck B12 and thyroid  Stop the red yeast rice--- low dose but statins are occasionally associated with cognitive decline

## 2015-08-15 NOTE — Progress Notes (Signed)
Subjective:    Patient ID: Hector Lucero, male    DOB: 1927/11/28, 81 y.o.   MRN: [id number]  HPI Here with wife Considering transfer of care from Dr Derrel Nip but not sure  Ongoing concern about memory issues Misplacing things---like his CPAP while travelling, credit card, etc Will be a little more confused after a nap Wife is concerned about alcohol--will occasionally have 1 glass of wine or 1 beer Still drives --sense of direction is not good (occasionally for places he has been already) Wife now starting to take over the bills--but not because he had any problems with them Will occasionally get lost even in [place]--figures it out quickly  Sleeping with CPAP again Awakens rested Occasional daytime fatigue Did use alprazolam briefly --but no longer needs it Gets passing depressed mood--may last a few hours. Considers it mild No anxiety problems  Doesn't check sugars  Current Outpatient Prescriptions on File Prior to Visit  Medication Sig Dispense Refill  . ALPRAZolam (XANAX) 0.25 MG tablet Take 1 tablet (0.25 mg total) by mouth at bedtime as needed for sleep. 30 tablet 2  . aspirin 81 MG tablet Take 81 mg by mouth daily.      Marland Kitchen diltiazem (CARDIZEM CD) 120 MG 24 hr capsule Take 1 capsule (120 mg total) by mouth daily. 90 capsule 3  . fish oil-omega-3 fatty acids 1000 MG capsule Take 1,000 mg by mouth daily.      . Multiple Vitamins-Minerals (MENS 50+ MULTI VITAMIN/MIN PO) Take 1 tablet by mouth daily.    . Red Yeast Rice Extract ([employer]S RED YEAST RICE) 600 MG CAPS Take 1 capsule (600 mg total) by mouth 2 (two) times daily. 180 capsule 2   No current facility-administered medications on file prior to visit.    No Known Allergies  Past Medical History  Diagnosis Date  . Radial fracture 2006    right, screws and plates  . Ulnar fracture 1995    fall off of ladder  . Hypertension     Past Surgical History  Procedure Laterality Date  . Hernia repair  Aug 2012    right  indirect inguinal, with mesh    Family History  Problem Relation Age of Onset  . Diabetes Mother     Social History   Social History  . Marital Status: Married    Spouse Name: N/A  . Number of Children: 0  . Years of Education: N/A   Occupational History  . Retired     Energy manager.   Social History Main Topics  . Smoking status: Never Smoker   . Smokeless tobacco: Never Used  . Alcohol Use: 1.8 oz/week    3 Glasses of wine per week     Comment: occasional  . Drug Use: No  . Sexual Activity: Not Currently   Other Topics Concern  . Not on file   Social History Narrative   Exercises regularly 5 days weekly at the [employer] [facility]   4 degrees-- 2 Bachelor's, MBA and MA      Has living will   Wife is health care POA--- nephew [name]--is alternate   Has DNR already   No tube feeds if cognitively unaware   Review of Systems Appetite is good Weight is stable    Objective:   Physical Exam  Constitutional: He appears well-developed and well-nourished. No distress.  Neck: Normal range of motion. Neck supple. No thyromegaly present.  Cardiovascular: Normal rate, regular rhythm, normal heart  sounds and intact distal pulses.  Exam reveals no gallop.   No murmur heard. Pulmonary/Chest: Effort normal and breath sounds normal. No respiratory distress. He has no wheezes. He has no rales.  Abdominal: Soft. There is no tenderness.  Musculoskeletal: He exhibits no edema or tenderness.  Lymphadenopathy:    He has no cervical adenopathy.  Neurological:  Normal sensation in feet  Skin: No rash noted.  Psychiatric: He has a normal mood and affect. His behavior is normal.          Assessment & Plan:

## 2015-08-16 ENCOUNTER — Encounter: Payer: Self-pay | Admitting: *Deleted

## 2015-08-16 LAB — HEMOGLOBIN A1C: Hgb A1c MFr Bld: 6.1 % (ref 4.6–6.5)

## 2015-08-16 LAB — VITAMIN B12: Vitamin B-12: 556 pg/mL (ref 211–911)

## 2015-08-16 LAB — T4, FREE: FREE T4: 0.97 ng/dL (ref 0.60–1.60)

## 2015-08-31 ENCOUNTER — Emergency Department: Payer: Medicare Other

## 2015-08-31 ENCOUNTER — Inpatient Hospital Stay
Admission: EM | Admit: 2015-08-31 | Discharge: 2015-09-02 | DRG: 312 | Disposition: A | Discharge status: Home / Self Care | Payer: Medicare Other | Attending: Specialist | Admitting: Specialist

## 2015-08-31 ENCOUNTER — Encounter: Payer: Self-pay | Admitting: *Deleted

## 2015-08-31 DIAGNOSIS — R55 Syncope and collapse: Secondary | ICD-10-CM | POA: Diagnosis not present

## 2015-08-31 DIAGNOSIS — S42202A Unspecified fracture of upper end of left humerus, initial encounter for closed fracture: Secondary | ICD-10-CM

## 2015-08-31 DIAGNOSIS — W19XXXA Unspecified fall, initial encounter: Secondary | ICD-10-CM | POA: Diagnosis present

## 2015-08-31 DIAGNOSIS — R008 Other abnormalities of heart beat: Secondary | ICD-10-CM | POA: Diagnosis present

## 2015-08-31 DIAGNOSIS — G4733 Obstructive sleep apnea (adult) (pediatric): Secondary | ICD-10-CM | POA: Diagnosis present

## 2015-08-31 DIAGNOSIS — I441 Atrioventricular block, second degree: Secondary | ICD-10-CM | POA: Diagnosis present

## 2015-08-31 DIAGNOSIS — I10 Essential (primary) hypertension: Secondary | ICD-10-CM | POA: Diagnosis not present

## 2015-08-31 DIAGNOSIS — S42212A Unspecified displaced fracture of surgical neck of left humerus, initial encounter for closed fracture: Secondary | ICD-10-CM | POA: Diagnosis not present

## 2015-08-31 DIAGNOSIS — R001 Bradycardia, unspecified: Secondary | ICD-10-CM | POA: Diagnosis not present

## 2015-08-31 DIAGNOSIS — I471 Supraventricular tachycardia: Secondary | ICD-10-CM | POA: Diagnosis not present

## 2015-08-31 DIAGNOSIS — Z833 Family history of diabetes mellitus: Secondary | ICD-10-CM | POA: Diagnosis not present

## 2015-08-31 DIAGNOSIS — E785 Hyperlipidemia, unspecified: Secondary | ICD-10-CM | POA: Diagnosis present

## 2015-08-31 DIAGNOSIS — M810 Age-related osteoporosis without current pathological fracture: Secondary | ICD-10-CM | POA: Diagnosis present

## 2015-08-31 DIAGNOSIS — Z79899 Other long term (current) drug therapy: Secondary | ICD-10-CM | POA: Diagnosis not present

## 2015-08-31 HISTORY — DX: Supraventricular tachycardia: I47.1

## 2015-08-31 HISTORY — DX: Obstructive sleep apnea (adult) (pediatric): G47.33

## 2015-08-31 HISTORY — DX: Hyperlipidemia, unspecified: E78.5

## 2015-08-31 HISTORY — DX: Other fatigue: R53.83

## 2015-08-31 LAB — BASIC METABOLIC PANEL
ANION GAP: 9 (ref 5–15)
BUN: 15 mg/dL (ref 6–20)
CALCIUM: 9 mg/dL (ref 8.9–10.3)
CHLORIDE: 101 mmol/L (ref 101–111)
CO2: 26 mmol/L (ref 22–32)
CREATININE: 0.69 mg/dL (ref 0.61–1.24)
GFR calc non Af Amer: >60 mL/min (ref >60)
Glucose, Bld: 129 mg/dL — ABNORMAL HIGH (ref 65–99)
Potassium: 3.9 mmol/L (ref 3.5–5.1)
SODIUM: 136 mmol/L (ref 135–145)

## 2015-08-31 LAB — TROPONIN I

## 2015-08-31 LAB — CBC
HCT: 40.3 % (ref 40.0–52.0)
HEMOGLOBIN: 13.6 g/dL (ref 13.0–18.0)
MCH: 31.5 pg (ref 26.0–34.0)
MCHC: 33.7 g/dL (ref 32.0–36.0)
MCV: 93.4 fL (ref 80.0–100.0)
PLATELETS: 189 10*3/uL (ref 150–440)
RBC: 4.32 MIL/uL — AB (ref 4.40–5.90)
RDW: 13.4 % (ref 11.5–14.5)
WBC: 8.7 10*3/uL (ref 3.8–10.6)

## 2015-08-31 MED ORDER — ONDANSETRON HCL 4 MG/2ML IJ SOLN: 4.0000 mg | Freq: Four times a day (QID) | INTRAMUSCULAR | Status: DC | PRN

## 2015-08-31 MED ORDER — ACETAMINOPHEN 650 MG RE SUPP: 650.0000 mg | Freq: Four times a day (QID) | RECTAL | Status: DC | PRN

## 2015-08-31 MED ORDER — HEPARIN SODIUM (PORCINE) 5000 UNIT/ML IJ SOLN
5000.0000 [IU] | Freq: Three times a day (TID) | INTRAMUSCULAR | Status: DC
  Administered 2015-09-01 – 2015-09-02 (×4): 5000 [IU] via SUBCUTANEOUS
  Filled 2015-08-31 (×5): qty 1

## 2015-08-31 MED ORDER — HYDROMORPHONE HCL 1 MG/ML IJ SOLN
0.5000 mg | Freq: Once | INTRAMUSCULAR | Status: AC
  Administered 2015-08-31: 0.5 mg via INTRAVENOUS
  Filled 2015-08-31: qty 1

## 2015-08-31 MED ORDER — SODIUM CHLORIDE 0.9 % IJ SOLN
3.0000 mL | Freq: Two times a day (BID) | INTRAMUSCULAR | Status: DC
Start: 2015-08-31 — End: 2015-09-02
  Administered 2015-09-01 (×2): 3 mL via INTRAVENOUS

## 2015-08-31 MED ORDER — MORPHINE SULFATE (PF) 2 MG/ML IV SOLN: 2.0000 mg | INTRAVENOUS | Status: DC | PRN

## 2015-08-31 MED ORDER — ONDANSETRON HCL 4 MG PO TABS: 4.0000 mg | ORAL_TABLET | Freq: Four times a day (QID) | ORAL | Status: DC | PRN

## 2015-08-31 MED ORDER — ACETAMINOPHEN 325 MG PO TABS: 650.0000 mg | ORAL_TABLET | Freq: Four times a day (QID) | ORAL | Status: DC | PRN

## 2015-08-31 MED ORDER — OXYCODONE HCL 5 MG PO TABS: 5.0000 mg | ORAL_TABLET | ORAL | Status: DC | PRN

## 2015-08-31 NOTE — ED Provider Notes (Addendum)
[facility] Emergency Department Provider Note  ____________________________________________  Time seen: 2015  I have reviewed the triage vital signs and the nursing notes.   HISTORY  Chief Complaint Fall and Loss of Consciousness     HPI Hector Lucero is a 81 y.o. male who resides at [facility]. He was doing his usual daily exercise, which includes walking 1 mile. He was up to his eighth lap out of 12 when he felt he needed to go to the bathroom. He walked inside a building to do this and had a syncopal episode. He denies any chest pain or palpitations prior to the syncopal episode or after. Next thing he knew, he woke up on the floor inside the building. He felt pain in his left shoulder upon waking.  The patient's wife, presents to the emergency department, reports that he has a history of ventricular tachycardia. When I have asked when he has been in ventricular tachycardia, the patient reports he doesn't think he ever has been. He is alert and communicative. This part of the history is a little unclear at this time. He has seen Dr. Rockey Situ, cardiology.    Past Medical History  Diagnosis Date  . Radial fracture 2006    right, screws and plates  . Ulnar fracture 1995    fall off of ladder  . Hypertension     Patient Active Problem List   Diagnosis Date Noted  . Dizziness and giddiness 06/04/2015  . Underweight 01/26/2015  . SVT (supraventricular tachycardia) ([place]) 01/15/2015  . History of recent fall 09/16/2014  . Cognitive changes 09/16/2014  . OSA (obstructive sleep apnea) 05/30/2014  . Cancer of prostate w/low recurrence risk (T1-2a, Gleason<7 & PSA<10) ([place]) 03/09/2014  . Routine general medical examination at a health care facility 06/23/2013  . Fatigue 05/04/2012  . Diabetes mellitus type 2, diet-controlled ([place]) 12/24/2011  . Hyperlipidemia with target LDL less than 70 12/24/2011  . Tachycardia 07/30/2011  . Inguinal hernia 06/30/2011     Past Surgical History  Procedure Laterality Date  . Hernia repair  Aug 2012    right indirect inguinal, with mesh    Current Outpatient Rx  Name  Route  Sig  Dispense  Refill  . aspirin 81 MG tablet   Oral   Take 81 mg by mouth daily.           Marland Kitchen diltiazem (CARDIZEM CD) 120 MG 24 hr capsule   Oral   Take 1 capsule (120 mg total) by mouth daily.   90 capsule   3   . fish oil-omega-3 fatty acids 1000 MG capsule   Oral   Take 1,000 mg by mouth daily.           . Multiple Vitamins-Minerals (MENS 50+ MULTI VITAMIN/MIN PO)   Oral   Take 1 tablet by mouth daily.           Allergies Review of patient's allergies indicates no known allergies.  Family History  Problem Relation Age of Onset  . Diabetes Mother     Social History Social History  Substance Use Topics  . Smoking status: Never Smoker   . Smokeless tobacco: Never Used  . Alcohol Use: 1.8 oz/week    3 Glasses of wine per week     Comment: occasional    Review of Systems  Constitutional: Negative for fever. ENT: Negative for sore throat. Cardiovascular: Negative for chest pain. Positive for syncope. See history of present illness Respiratory: Negative for  cough. Gastrointestinal: Negative for abdominal pain, vomiting and diarrhea. Genitourinary: Negative for dysuria. Musculoskeletal: Pain, left upper arm/shoulder Skin: Negative for rash. Neurological: Negative for paresthesia or weakness   10-point ROS otherwise negative.  ____________________________________________   PHYSICAL EXAM:  VITAL SIGNS: ED Triage Vitals  Enc Vitals Group     BP 08/31/15 1838 128/86 mmHg     Pulse Rate 08/31/15 1838 87     Resp 08/31/15 1838 18     Temp 08/31/15 1838 97.4 F (36.3 C)     Temp Source 08/31/15 1838 Oral     SpO2 08/31/15 1838 98 %     Weight 08/31/15 1838 142 lb (64.411 kg)     Height 08/31/15 1838 5\' 9"  (1.753 m)     Head Cir --      Peak Flow --      Pain Score 08/31/15 1854 10      Pain Loc --      Pain Edu? --      Excl. in [place]? --     Constitutional: Alert and oriented. Patient has ongoing discomfort in his left shoulder but otherwise no acute distress. ENT   Head: Normocephalic and atraumatic.   Nose: No congestion/rhinnorhea.    Cervical: Nontender. Normal spontaneous movement. Cardiovascular: Normal rate, regular rhythm, no murmur noted Respiratory:  Normal respiratory effort, no tachypnea.    Breath sounds are clear and equal bilaterally.  Gastrointestinal: Soft and nontender. No distention.  Back: No muscle spasm, no tenderness, no CVA tenderness. Musculoskeletal: There appears to be swelling underneath the left deltoid with tenderness in this area. Other than this, no noted deformity area topical is intact, no sign of separation. Neurologic:  Normal speech and language. No gross focal neurologic deficits are appreciated.  Skin:  Skin is warm, dry. No rash noted. Psychiatric: Mood and affect are normal. Speech and behavior are normal.  ____________________________________________    LABS (pertinent positives/negatives)  Labs Reviewed  BASIC METABOLIC PANEL - Abnormal; Notable for the following:    Glucose, Bld 129 (*)    All other components within normal limits  CBC - Abnormal; Notable for the following:    RBC 4.32 (*)    All other components within normal limits  TROPONIN I  URINALYSIS COMPLETEWITH MICROSCOPIC ([facility] ONLY)  CBG MONITORING, ED     ____________________________________________   EKG  ED ECG REPORT I, Jawara Latorre W, the attending physician, personally viewed and interpreted this ECG.   Date: 08/31/2015  EKG Time: 1851  Rate: 82  Rhythm: Normal sinus rhythm  Axis: Normal  Intervals: Normal  ST&T Change: None noted  ED ECG REPORT  -  status post repeat syncopal episode in the emergency department with diaphoresis and an abnormal monitor rhythm showing no P waves at times. I, Damiya Sandefur W, the attending  physician, personally viewed and interpreted this ECG.   Date: 08/31/2015  EKG Time: 2216  Rate: 51  Rhythm: Sinus bradycardia  Axis: Normal  Intervals: Normal  ST&T Change: None noted    ____________________________________________    RADIOLOGY  CT head:  IMPRESSION: 1. No acute intracranial abnormality. 2. Stable atrophy and chronic small vessel ischemic change.   Left humerus: IMPRESSION: Impacted mildly displaced humerus fracture through the surgical neck.  ___________________________________________  CRITICAL CARE Performed by: Ahmed Prima   Total critical care time: 30 minutes due to the acute events within the emergency department with this patient having a second syncopal episode, and a cardiac dysrhythmia consistent with a second-degree block.  Critical care time was exclusive of separately billable procedures and treating other patients.  Critical care was necessary to treat or prevent imminent or life-threatening deterioration.  Critical care was time spent personally by me on the following activities: development of treatment plan with patient and/or surrogate as well as nursing, discussions with consultants, evaluation of patient's response to treatment, examination of patient, obtaining history from patient or surrogate, ordering and performing treatments and interventions, ordering and review of laboratory studies, ordering and review of radiographic studies, pulse oximetry and re-evaluation of patient's condition.    ____________________________________________   INITIAL IMPRESSION / ASSESSMENT AND PLAN / ED COURSE  Pertinent labs & imaging results that were available during my care of the patient were reviewed by me and considered in my medical decision making (see chart for details).  Patient status post syncope with signs and symptoms of a proximal humerus fracture. We will obtain a CT scan due to the fall and loss of consciousness as well  as a x-ray of the left humerus.  ----------------------------------------- 9:41 PM on 08/31/2015 -----------------------------------------  CT of the brain shows no acute injury. X-ray of the left wrist shows a mildly displaced humerus fracture through the surgical neck.  We will treat his pain with the fracture. Given his recent syncopal episode that led to this and a questionable history of V. tach in the past, we will move to admit the patient for observation overnight on a monitor.  ----------------------------------------- 10:15 PM on 08/31/2015 -----------------------------------------  The patient just had a syncopal episode in the emergency department bed. He became unresponsive, pale, and diaphoretic. I was called to the room. I was told his heart rate went down to 20. The patient is now somewhat responsive with a heart rate of mid 40s to mid 50s. The monitor shows sinus rhythm with T waves, but then he will run 3-4 beats with no P-wave. A 12-lead EKG is pending. I discussed this with Dr. Lavetta Nielsen as well as.   ____________________________________________   FINAL CLINICAL IMPRESSION(S) / ED DIAGNOSES  Final diagnoses:  Proximal humerus fracture, left, closed, initial encounter  Syncope and collapse   cardiac dysrhythmia, second degree heart block leading to syncope.    Ahmed Prima, MD 08/31/15 2151  Ahmed Prima, MD 08/31/15 9476  Ahmed Prima, MD 08/31/15 [phone]

## 2015-08-31 NOTE — H&P (Signed)
[facility] at [place] NAME: Hector Lucero    MR#:  [id number]  DATE OF BIRTH:  11/10/1927   DATE OF ADMISSION:  08/31/2015  PRIMARY CARE PHYSICIAN: Viviana Simpler, MD   REQUESTING/REFERRING PHYSICIAN: Thomasene Lot  CHIEF COMPLAINT:   Chief Complaint  Patient presents with  . Fall  . Loss of Consciousness    HISTORY OF PRESENT ILLNESS:  Hector Lucero  is a 81 y.o. male with a known history essential hypertension is presenting after syncopal episode. History aided from wife present at bedside. States that he was in his usual state of health completing his daily walking when he returned followed by syncopal episode. Patient himself is unable to describe further information about the episode. Also describes having pain in his left shoulder described as "pain" intensity 10/10 worse with movement factors. In the emergency department he had a subsequent syncopal episode noted become bradycardic at that time emergency department staff believe that he went into a second-degree heart block, however he is currently in normal sinus rhythm  PAST MEDICAL HISTORY:   Past Medical History  Diagnosis Date  . Radial fracture 2006    right, screws and plates  . Ulnar fracture 1995    fall off of ladder  . Hypertension     PAST SURGICAL HISTORY:   Past Surgical History  Procedure Laterality Date  . Hernia repair  Aug 2012    right indirect inguinal, with mesh    SOCIAL HISTORY:   Social History  Substance Use Topics  . Smoking status: Never Smoker   . Smokeless tobacco: Never Used  . Alcohol Use: 1.8 oz/week    3 Glasses of wine per week     Comment: occasional    FAMILY HISTORY:   Family History  Problem Relation Age of Onset  . Diabetes Mother     DRUG ALLERGIES:  No Known Allergies  REVIEW OF SYSTEMS:  REVIEW OF SYSTEMS:  CONSTITUTIONAL: Denies fevers, chills,positive  fatigue, weakness.  EYES: Denies blurred vision,  double vision, or eye pain.  EARS, NOSE, THROAT: Denies tinnitus, ear pain, hearing loss.  RESPIRATORY: denies cough, shortness of breath, wheezing  CARDIOVASCULAR: Denies chest pain, palpitations, edema.  GASTROINTESTINAL: Denies nausea, vomiting, diarrhea, abdominal pain.  GENITOURINARY: Denies dysuria, hematuria.  ENDOCRINE: Denies nocturia or thyroid problems. HEMATOLOGIC AND LYMPHATIC: Denies easy bruising or bleeding.  SKIN: Denies rash or lesions.  MUSCULOSKELETAL: Denies pain in neck, back, shoulder, knees, hips, or further arthritic symptoms. Positive left arm pain  NEUROLOGIC: Denies paralysis, paresthesias.  PSYCHIATRIC: Denies anxiety or depressive symptoms. Otherwise full review of systems performed by me is negative.   MEDICATIONS AT HOME:   Prior to Admission medications   Medication Sig Start Date End Date Taking? Authorizing Provider  calcium-vitamin D (OSCAL WITH D) 500-200 MG-UNIT tablet Take 1 tablet by mouth daily with breakfast.   Yes Historical Provider, MD  diltiazem (CARDIZEM CD) 120 MG 24 hr capsule Take 1 capsule (120 mg total) by mouth daily. 01/15/15  Yes Minna Merritts, MD  fish oil-omega-3 fatty acids 1000 MG capsule Take 1,000 mg by mouth daily.     Yes Historical Provider, MD  Multiple Vitamins-Minerals (MENS 50+ MULTI VITAMIN/MIN PO) Take 1 tablet by mouth daily.   Yes Historical Provider, MD      VITAL SIGNS:  Blood pressure 94/59, pulse 61, temperature 97.4 F (36.3 C), temperature source Oral, resp. rate 18, height 5\' 9"  (1.753 m), weight 142  lb (64.411 kg), SpO2 97 %.  PHYSICAL EXAMINATION:  VITAL SIGNS: Filed Vitals:   08/31/15 2219  BP: 94/59  Pulse: 61  Temp:   Resp: 3   GENERAL:81 y.o.male currently in no acute distress.  HEAD: Normocephalic, atraumatic.  EYES: Pupils equal, round, reactive to light. Extraocular muscles intact. No scleral icterus.  MOUTH: Moist mucosal membrane. Dentition intact. No abscess noted.  EAR, NOSE,  THROAT: Clear without exudates. No external lesions.  NECK: Supple. No thyromegaly. No nodules. No JVD.  PULMONARY: Clear to ascultation, without wheeze rails or rhonci. No use of accessory muscles, Good respiratory effort. good air entry bilaterally CHEST: Nontender to palpation.  CARDIOVASCULAR: S1 and S2. Regular rate and rhythm. No murmurs, rubs, or gallops. No edema. Pedal pulses 2+ bilaterally.  GASTROINTESTINAL: Soft, nontender, nondistended. No masses. Positive bowel sounds. No hepatosplenomegaly.  MUSCULOSKELETAL: No swelling, clubbing, or edema. Range of motionlimited in left upper extremity given fracture of humerus  NEUROLOGIC: Cranial nerves II through XII are intact. No gross focal neurological deficits. Sensation intact. Reflexes intact.  SKIN: No ulceration, lesions, rashes, or cyanosis. Skin warm and dry. Turgor intact.  PSYCHIATRIC: Mood, affect within normal limits. The patient is awake, alert and oriented x 3. Insight, judgment intact.    LABORATORY PANEL:   CBC  Recent Labs Lab 08/31/15 1855  WBC 8.7  HGB 13.6  HCT 40.3  PLT 189   ------------------------------------------------------------------------------------------------------------------  Chemistries   Recent Labs Lab 08/31/15 1855  NA 136  K 3.9  CL 101  CO2 26  GLUCOSE 129*  BUN 15  CREATININE 0.69  CALCIUM 9.0   ------------------------------------------------------------------------------------------------------------------  Cardiac Enzymes  Recent Labs Lab 08/30/15 2030  TROPONINI <0.03   ------------------------------------------------------------------------------------------------------------------  RADIOLOGY:  Ct Head Wo Contrast  08/31/2015  CLINICAL DATA:  Syncope with fall tonight. EXAM: CT HEAD WITHOUT CONTRAST TECHNIQUE: Contiguous axial images were obtained from the base of the skull through the vertex without intravenous contrast. COMPARISON:  11/26/2014 FINDINGS: No  intracranial hemorrhage, mass effect, or midline shift. Stable atrophy and chronic small vessel ischemia. Stable remote lacunar infarct in the right internal capsule. No hydrocephalus. The basilar cisterns are patent. No evidence of territorial infarct. No intracranial fluid collection. Calvarium is intact. There is diffuse mucosal thickening of the ethmoid air cells and right frontal sinus. The mastoid air cells are well aerated. IMPRESSION: 1.  No acute intracranial abnormality. 2. Stable atrophy and chronic small vessel ischemic change. Electronically Signed   By: Jeb Levering M.D.   On: 08/31/2015 21:09   Dg Humerus Left  08/31/2015  CLINICAL DATA:  Proximal humerus pain after falling this evening. EXAM: LEFT HUMERUS - 2+ VIEW COMPARISON:  None. FINDINGS: There is an impacted mildly displaced fractures to the surgical neck of the humerus. No extension to the glenohumeral joint. Glenohumeral alignment is maintained. The distal humerus is intact. Elbow alignment is maintained. IMPRESSION: Impacted mildly displaced humerus fracture through the surgical neck. Electronically Signed   By: Jeb Levering M.D.   On: 08/31/2015 20:53    EKG:   Orders placed or performed during the hospital encounter of 08/31/15  . EKG 12-Lead  . EKG 12-Lead  . ED EKG  . ED EKG    IMPRESSION AND PLAN:   82 year old Caucasian gentleman history of essential hypertension presenting after syncopal episode.  1. Syncope: Place on telemetry trend cardiac enzymes consult cardiology follows with Dr. Rockey Situ, in emergency department staff believe a witnessed an episode of second-degree heart block however currently in  normal sinus rhythm, avoid further AV nodal agents for now.  2. Left Impacted mildly displaced humerus fracture through the surgical neck.: Provide pain medication consult orthopedic surgery 3. Essential hypertension: On Cardizem only. Hold this for heart rate less than 60 4. Venous embolism prophylactic:  Heparin subcutaneous    All the records are reviewed and case discussed with ED provider. Management plans discussed with the patient, family and they are in agreement.  CODE STATUS: Full  TOTAL TIME TAKING CARE OF THIS PATIENT: 45  minutes.    Hower,  Karenann Cai.D on 08/31/2015 at 10:58 PM  Between 7am to 6pm - Pager - [phone]  After 6pm: House Pager: - [phone]  [facility]  Office  [phone]  CC: Primary care physician; Viviana Simpler, MD

## 2015-08-31 NOTE — ED Notes (Signed)
Pt reports having a syncopal episode after doing some walking laps around the track at his assisted living facility. Pt reports LOC, but denies any chest pain or SOB previous to the syncopal event.

## 2015-08-31 NOTE — ED Notes (Signed)
Pt arrived to ED reporting LOC this afternoon that caused pt to fall and injure left shoulder. Pt is unable to move left arm without reported 10/10 pain. Pt also reports having no memory of event. Pt remembers walking and then waking up on the ground. Pt denies symptoms other than pain in shoulder at this time. No hx of syncope. Pt is A&Ox4.

## 2015-08-31 NOTE — ED Notes (Addendum)
Cardiac Monitor alerted that the pt was very bradycardiac. Per April, RN, the pt was found unresponsive, diaphoretic, and unarousable to sternal rub. EDP immediately to bedside. Code cart brought to room and pads placed on pt; pt placed on 2L O2 [place] for comfort. Pt moved from sitting upright to a laying position. Pt became alert approximately 5 minutes later. This RN remained with the pt for an additional 5-10 minutes to monitor; pt HR noted in the low 60's. Pt A&O with s/o at bedside. Pt and s/o instructed to notify RN immediately with any changes in pt condition or concerns; will continue to monitor.

## 2015-09-01 DIAGNOSIS — R001 Bradycardia, unspecified: Secondary | ICD-10-CM

## 2015-09-01 DIAGNOSIS — R55 Syncope and collapse: Principal | ICD-10-CM

## 2015-09-01 DIAGNOSIS — I471 Supraventricular tachycardia: Secondary | ICD-10-CM

## 2015-09-01 LAB — TROPONIN I
Troponin I: <0.03 ng/mL (ref <0.031)
Troponin I: <0.03 ng/mL (ref <0.031)

## 2015-09-01 LAB — MAGNESIUM: MAGNESIUM: 2.2 mg/dL (ref 1.7–2.4)

## 2015-09-01 LAB — URINALYSIS COMPLETE WITH MICROSCOPIC (ARMC ONLY)
BACTERIA UA: NONE SEEN
Bilirubin Urine: NEGATIVE
GLUCOSE, UA: NEGATIVE mg/dL
Hgb urine dipstick: NEGATIVE
Leukocytes, UA: NEGATIVE
NITRITE: NEGATIVE
PROTEIN: NEGATIVE mg/dL
SPECIFIC GRAVITY, URINE: 1.013 (ref 1.005–1.030)
Squamous Epithelial / LPF: NONE SEEN
pH: 6 (ref 5.0–8.0)

## 2015-09-01 LAB — MRSA PCR SCREENING: MRSA by PCR: NEGATIVE

## 2015-09-01 LAB — TSH: TSH: 1.475 u[IU]/mL (ref 0.350–4.500)

## 2015-09-01 MED ORDER — OMEGA-3-ACID ETHYL ESTERS 1 G PO CAPS
1.0000 g | ORAL_CAPSULE | Freq: Every day | ORAL | Status: DC
  Administered 2015-09-01 – 2015-09-02 (×2): 1 g via ORAL
  Filled 2015-09-01 (×2): qty 1

## 2015-09-01 MED ORDER — OMEGA-3 FATTY ACIDS 1000 MG PO CAPS: 1000.0000 mg | ORAL_CAPSULE | Freq: Every day | ORAL | Status: DC

## 2015-09-01 MED ORDER — CALCIUM CARBONATE-VITAMIN D 500-200 MG-UNIT PO TABS
1.0000 | ORAL_TABLET | Freq: Every day | ORAL | Status: DC
  Administered 2015-09-01 – 2015-09-02 (×2): 1 via ORAL
  Filled 2015-09-01 (×2): qty 1

## 2015-09-01 MED ORDER — DILTIAZEM HCL ER COATED BEADS 120 MG PO CP24: 120.0000 mg | ORAL_CAPSULE | Freq: Every day | ORAL | Status: DC

## 2015-09-01 MED ORDER — ADULT MULTIVITAMIN W/MINERALS CH
1.0000 | ORAL_TABLET | Freq: Every day | ORAL | Status: DC
  Administered 2015-09-01 – 2015-09-02 (×2): 1 via ORAL
  Filled 2015-09-01 (×2): qty 1

## 2015-09-01 NOTE — Consult Note (Signed)
Cardiology Consultation Note  Patient ID: Hector Lucero, MRN: [id number], DOB/AGE: 11/15/1927 81 y.o. Admit date: 08/31/2015   Date of Consult: 09/01/2015 Primary Physician: Viviana Simpler, MD Primary Cardiologist: Dr. Rockey Situ, MD  Chief Complaint: Fall/LOC Reason for Consult: Syncope  HPI: 81 y.o. male with h/o SVT on Cardizem CD 120 mg daily, OSA, HLD, HTN, chronic fatigue s/p surgical hernia repair 06/2011 who presented to [facility] with syncopal episode while walking into the restroom after walking his usual 1 mile.   Back in August 2012 he was noted to be tachycardic during a pre-op evaluation. Holter monitor showed 27 episodes of SVT/atrial tachycardia with the longest episode lasting 36 beats. He was asymptomatic. He has previously been on metoprolol, though was later changed to short acting diltiazem 30 mg tid, and ultimately changed to Cardizem CD 120 mg. He is very active at baseline, going to the gym 3 times per week and walking 1 mile 3 times weekly. He tries to improve his mile times weekly. He is currently down to 21 minute mile. He also does water aerobics with his wife. He denies any palpitations, chest pains, diaphoresis, nausea, or vomiting.   On 10/21 he had just finished his usual 1 mile walk and was walking into the restroom. Upon getting into the restroom he reports falling. He does not know exactly how long he was on the floor, though he suspects he was on the ground for 1-2 minutes. He was found he 2 neighbors that had also been out on the track and walked into the restroom. They got him up and he got to his car and drove home to his wife. He denied any feelings of chest pain, palpitations, diaphoresis, nausea, vomiting, or presyncope. He continued to not "feel right." Thus, his wife wanted him to come to [facility]. He was unable to describe his feelings of not feeling right any further. This was his first episode of syncope.    Upon his arrival to [facility] he was found to have troponin  negative x 2, K+ 3.9, Mg++ 2.2, TSH normal, hgb 13.6, SCr 0.69, 1+ ketones on UA, ECG with NSR, 82 bpm, low voltage QRS, no significant st/t changes. ED telemetry strips: NSR with rare PACs, episode of junctional rhythm with AV dissociation. 2A telemetry: NSR, 80's. This morning he is asymptomatic. His Cardizem CD has been held.         Past Medical History  Diagnosis Date  . Radial fracture 2006    right, screws and plates  . Ulnar fracture 1995    fall off of ladder  . Hypertension   . SVT (supraventricular tachycardia) ([place])   . HLD (hyperlipidemia)   . OSA (obstructive sleep apnea)   . Fatigue       Most Recent Cardiac Studies: none   Surgical History:  Past Surgical History  Procedure Laterality Date  . Hernia repair  Aug 2012    right indirect inguinal, with mesh     Home Meds: Prior to Admission medications   Medication Sig Start Date End Date Taking? Authorizing Provider  calcium-vitamin D (OSCAL WITH D) 500-200 MG-UNIT tablet Take 1 tablet by mouth daily with breakfast.   Yes Historical Provider, MD  diltiazem (CARDIZEM CD) 120 MG 24 hr capsule Take 1 capsule (120 mg total) by mouth daily. 01/15/15  Yes Minna Merritts, MD  fish oil-omega-3 fatty acids 1000 MG capsule Take 1,000 mg by mouth daily.     Yes Historical Provider, MD  Multiple  Vitamins-Minerals (MENS 50+ MULTI VITAMIN/MIN PO) Take 1 tablet by mouth daily.   Yes Historical Provider, MD    Inpatient Medications:  . calcium-vitamin D  1 tablet Oral Q breakfast  . heparin  5,000 Units Subcutaneous 3 times per day  . multivitamin with minerals  1 tablet Oral Daily  . omega-3 acid ethyl esters  1 g Oral Daily  . sodium chloride  3 mL Intravenous Q12H      Allergies: No Known Allergies  Social History   Social History  . Marital Status: Married    Spouse Name: N/A  . Number of Children: 0  . Years of Education: N/A   Occupational History  . Retired     Producer, television/film/video.   Social History Main Topics  . Smoking status: Never Smoker   . Smokeless tobacco: Never Used  . Alcohol Use: 1.8 oz/week    3 Glasses of wine per week     Comment: occasional  . Drug Use: No  . Sexual Activity: Not Currently   Other Topics Concern  . Not on file   Social History Narrative   Exercises regularly 5 days weekly at the [facility]   4 degrees-- 2 Bachelor's, MBA and MA      Has living will   Wife is health care POA--- nephew [name]--is alternate   Has DNR already   No tube feeds if cognitively unaware     Family History  Problem Relation Age of Onset  . Diabetes Mother      Review of Systems: Review of Systems  Constitutional: Positive for weight loss and malaise/fatigue. Negative for fever, chills and diaphoresis.       Planned weight loss over many years.   HENT: Negative for congestion.   Eyes: Negative for discharge and redness.  Respiratory: Negative for cough, hemoptysis, sputum production, shortness of breath and wheezing.   Cardiovascular: Negative for chest pain, palpitations, orthopnea, claudication, leg swelling and PND.  Gastrointestinal: Negative for heartburn, nausea, vomiting and abdominal pain.  Musculoskeletal: Positive for falls. Negative for myalgias, back pain, joint pain and neck pain.  Skin: Negative for rash.  Neurological: Positive for dizziness, loss of consciousness and weakness. Negative for tingling, tremors, sensory change, speech change, focal weakness and seizures.  Endo/Heme/Allergies: Does not bruise/bleed easily.  Psychiatric/Behavioral: Negative for substance abuse. The patient is not nervous/anxious.   All other systems reviewed and are negative.    Labs:  Recent Labs  08/30/15 2030 09/01/15 0937  TROPONINI <0.03 <0.03   Lab Results  Component Value Date   WBC 8.7 08/31/2015   HGB 13.6 08/31/2015   HCT 40.3 08/31/2015   MCV 93.4 08/31/2015   PLT 189 08/31/2015     Recent Labs Lab 08/31/15 1855  NA  136  K 3.9  CL 101  CO2 26  BUN 15  CREATININE 0.69  CALCIUM 9.0  GLUCOSE 129*   Lab Results  Component Value Date   CHOL 231* 07/19/2014   HDL 65.90 07/19/2014   LDLCALC 133* 07/19/2014   TRIG 162.0* 07/19/2014   No results found for: DDIMER  Radiology/Studies:  Ct Head Wo Contrast  08/31/2015  CLINICAL DATA:  Syncope with fall tonight. EXAM: CT HEAD WITHOUT CONTRAST TECHNIQUE: Contiguous axial images were obtained from the base of the skull through the vertex without intravenous contrast. COMPARISON:  11/26/2014 FINDINGS: No intracranial hemorrhage, mass effect, or midline shift. Stable atrophy and chronic small vessel ischemia. Stable remote lacunar  infarct in the right internal capsule. No hydrocephalus. The basilar cisterns are patent. No evidence of territorial infarct. No intracranial fluid collection. Calvarium is intact. There is diffuse mucosal thickening of the ethmoid air cells and right frontal sinus. The mastoid air cells are well aerated. IMPRESSION: 1.  No acute intracranial abnormality. 2. Stable atrophy and chronic small vessel ischemic change. Electronically Signed   By: Jeb Levering M.D.   On: 08/31/2015 21:09   Dg Humerus Left  08/31/2015  CLINICAL DATA:  Proximal humerus pain after falling this evening. EXAM: LEFT HUMERUS - 2+ VIEW COMPARISON:  None. FINDINGS: There is an impacted mildly displaced fractures to the surgical neck of the humerus. No extension to the glenohumeral joint. Glenohumeral alignment is maintained. The distal humerus is intact. Elbow alignment is maintained. IMPRESSION: Impacted mildly displaced humerus fracture through the surgical neck. Electronically Signed   By: Jeb Levering M.D.   On: 08/31/2015 20:53    EKG: NSR, 82 bpm, low voltage QRS, no significant st/t changes ED telemetry strips: NSR with rare PACs, episode of junctional rhythm with AV dissociation  2A telemetry: NSR, 80's      Weights: Filed Weights   08/31/15 1838  09/01/15 0119  Weight: 142 lb (64.411 kg) 138 lb 7.2 oz (62.801 kg)     Physical Exam: Blood pressure 127/67, pulse 86, temperature 98.2 F (36.8 C), temperature source Oral, resp. rate 16, height 5\' 9"  (1.753 m), weight 138 lb 7.2 oz (62.801 kg), SpO2 97 %. Body mass index is 20.44 kg/(m^2). General: Well developed, well nourished, in no acute distress. Head: Normocephalic, atraumatic, sclera non-icteric, no xanthomas, nares are without discharge.  Neck: Negative for carotid bruits. JVD not elevated. Lungs: Clear bilaterally to auscultation without wheezes, rales, or rhonchi. Breathing is unlabored. Heart: RRR with S1 S2. No murmurs, rubs, or gallops appreciated. Abdomen: Soft, non-tender, non-distended with normoactive bowel sounds. No hepatomegaly. No rebound/guarding. No obvious abdominal masses. Msk:  Strength and tone appear normal for age. Extremities: No clubbing or cyanosis. No edema.  Distal pedal pulses are 2+ and equal bilaterally. Neuro: Alert and oriented X 3. No facial asymmetry. No focal deficit. Moves all extremities spontaneously. Psych:  Responds to questions appropriately with a normal affect.    Assessment and Plan:   1. Syncope: -Patient was found to have a junctional rhythm with AV dissociation in the ED raising the question of possible arrhythmia earlier leading to his episode of syncope  -Watch on telemetry while inpatient and have patient ambulate to assess for appropriate chronotropic response  -Given his symptoms there is concern for possible need for PPM. If no arrhythmia is seen while inpatient would require outpatient cardiac monitoring and EP evaluation   2. History of SVT: -Remote history dating back to 2012 with patient being asymptomatic  -Prior history of 27 episodes with the longest lasting 36 beats at time of Holter monitor -Continue to hold Cardizem   -Doubt SVT lead to the above, suspect the underlying issue at hand is more the junctional rhythm  with AV dissociation given his symptoms and his continuation of "not feeling well" leading into the following day  3. HTN: -Stable    Signed, Christell Faith, PA-C Pager: [phone] 09/01/2015, 11:20 AM

## 2015-09-01 NOTE — Progress Notes (Signed)
[place] [facility] [place] NAME: Hector Lucero    MR#:  [id number]  DATE OF BIRTH:  September 29, 1928  SUBJECTIVE:  CHIEF COMPLAINT:   Chief Complaint  Patient presents with  . Fall  . Loss of Consciousness   Patient presented to the hospital due to a syncopal episode with collapse. No further episodes of syncope overnight. No chest pain, shortness of breath, nausea, vomiting. He does complain of left shoulder pain which is secondary to his humeral fracture. He denies any prodromal symptoms prior to her syncopal episode. No alarms on telemetry.  REVIEW OF SYSTEMS:    Review of Systems  Constitutional: Negative for fever and chills.  HENT: Negative for congestion and tinnitus.   Eyes: Negative for blurred vision and double vision.  Respiratory: Negative for cough, shortness of breath and wheezing.   Cardiovascular: Negative for chest pain, orthopnea and PND.  Gastrointestinal: Negative for nausea, vomiting, abdominal pain and diarrhea.  Genitourinary: Negative for dysuria and hematuria.  Musculoskeletal: Positive for joint pain ( left shoulder).  Neurological: Negative for dizziness, sensory change and focal weakness.  All other systems reviewed and are negative.   Nutrition: Heart healthy Tolerating Diet: Yes Tolerating PT: Await evaluation   DRUG ALLERGIES:  No Known Allergies  VITALS:  Blood pressure 129/66, pulse 89, temperature 97.7 F (36.5 C), temperature source Oral, resp. rate 20, height 5\' 9"  (1.753 m), weight 62.801 kg (138 lb 7.2 oz), SpO2 98 %.  PHYSICAL EXAMINATION:   Physical Exam  GENERAL:  81 y.o.-year-old patient lying in the bed with no acute distress.  EYES: Pupils equal, round, reactive to light and accommodation. No scleral icterus. Extraocular muscles intact.  HEENT: Head atraumatic, normocephalic. Oropharynx and nasopharynx clear.  NECK:  Supple, no jugular venous distention. No thyroid enlargement, no  tenderness.  LUNGS: Normal breath sounds bilaterally, no wheezing, rales, rhonchi. No use of accessory muscles of respiration.  CARDIOVASCULAR: S1, S2 normal. No murmurs, rubs, or gallops.  ABDOMEN: Soft, nontender, nondistended. Bowel sounds present. No organomegaly or mass.  EXTREMITIES: No cyanosis, clubbing or edema b/l. Left shoulder/arm bruising noted.    NEUROLOGIC: Cranial nerves II through XII are intact. No focal Motor or sensory deficits b/l.   PSYCHIATRIC: The patient is alert and oriented x 3. Good affect.  SKIN: No obvious rash, lesion, or ulcer.    LABORATORY PANEL:   CBC  Recent Labs Lab 08/31/15 1855  WBC 8.7  HGB 13.6  HCT 40.3  PLT 189   ------------------------------------------------------------------------------------------------------------------  Chemistries   Recent Labs Lab 08/31/15 1855  NA 136  K 3.9  CL 101  CO2 26  GLUCOSE 129*  BUN 15  CREATININE 0.69  CALCIUM 9.0  MG 2.2   ------------------------------------------------------------------------------------------------------------------  Cardiac Enzymes  Recent Labs Lab 09/01/15 0937  TROPONINI <0.03   ------------------------------------------------------------------------------------------------------------------  RADIOLOGY:  Ct Head Wo Contrast  08/31/2015  CLINICAL DATA:  Syncope with fall tonight. EXAM: CT HEAD WITHOUT CONTRAST TECHNIQUE: Contiguous axial images were obtained from the base of the skull through the vertex without intravenous contrast. COMPARISON:  11/26/2014 FINDINGS: No intracranial hemorrhage, mass effect, or midline shift. Stable atrophy and chronic small vessel ischemia. Stable remote lacunar infarct in the right internal capsule. No hydrocephalus. The basilar cisterns are patent. No evidence of territorial infarct. No intracranial fluid collection. Calvarium is intact. There is diffuse mucosal thickening of the ethmoid air cells and right frontal sinus. The  mastoid air cells are well aerated. IMPRESSION: 1.  No acute intracranial abnormality. 2. Stable atrophy and chronic small vessel ischemic change. Electronically Signed   By: Jeb Levering M.D.   On: 08/31/2015 21:09   Dg Humerus Left  08/31/2015  CLINICAL DATA:  Proximal humerus pain after falling this evening. EXAM: LEFT HUMERUS - 2+ VIEW COMPARISON:  None. FINDINGS: There is an impacted mildly displaced fractures to the surgical neck of the humerus. No extension to the glenohumeral joint. Glenohumeral alignment is maintained. The distal humerus is intact. Elbow alignment is maintained. IMPRESSION: Impacted mildly displaced humerus fracture through the surgical neck. Electronically Signed   By: Jeb Levering M.D.   On: 08/31/2015 20:53     ASSESSMENT AND PLAN:   81 year old male with past medical history of hypertension, history of SVT, hyperlipidemia, obstructive sleep apnea, who presented to the hospital due to a syncopal episode.  #1 syncope-the exact etiology of syncope is unclear. Likely suspected to be cardiogenic in nature. -Patient CT head on admission was negative. He has a nonfocal neurological exam. -On telemetry he has had no evidence of arrhythmia. As per documentation he was noted to have a second-degree AV block in the ER but not while on the floor. -Continue to hold Cardizem. Await cardiology evaluation. Patient likely would benefit from a 30 day loop monitor upon discharge.  #2 left shoulder/humerus fracture-patient has been seen by orthopedics. He will be placed in a sling. -No acute surgical intervention. Continue supportive care with pain control for now.  #3 osteoporosis-continue calcium and vitamin D supplements.    All the records are reviewed and case discussed with Care Management/Social Workerr. Management plans discussed with the patient, family and they are in agreement.  CODE STATUS: Full  DVT Prophylaxis: Heparin subcutaneous  TOTAL TIME TAKING  CARE OF THIS PATIENT: 30 minutes.   POSSIBLE D/C IN 1-2 DAYS, DEPENDING ON CLINICAL CONDITION.   Henreitta Leber M.D on 09/01/2015 at 12:04 PM  Between 7am to 6pm - Pager - [phone]  After 6pm go to [email or web address] - password [email or web address] [facility]  Office  [phone]  CC: Primary care physician; Viviana Simpler, MD

## 2015-09-01 NOTE — Consult Note (Addendum)
Patient seen, sling ordered. Full consult to follow.  Patient suffered a fall with loss of consciousness yesterday he is right-handed. He suffered a injury to the left shoulder and on exam he has markedly swelling to the shoulder with early ecchymosis. Sensation in the axillary radial and median nerve wrist distributions are intact as well as ulnar nerve. Skin is intact. X-rays reveal an impacted femoral neck fracture that is telescoped into the humeral head. In the current alignment is near anatomic except for the shortening.   Impression impacted left humeral neck fracture through the surgical neck.  Plan is for sling. Advised the patient to try not to use the arm and move the arm as little as possible for the next 6 weeks. We will want to see him in the office in 2 weeks for follow-up x-ray. If the fracture becomes separated and there be a defect in the humeral head which would be difficult to manage he appears to be someone who can cooperate with nonoperative management in hopes this will be successful.-

## 2015-09-01 NOTE — Progress Notes (Signed)
Pt. Arrived to unit via stretcher, pt. Transferred to bed via staff. General room orientation given. Instruction on how to use call bell and ascom system, VSS. Nasal swab collected. Pt. A&O. Will continue to monitor pt.   Skin assessment performed with Elayne Snare, RN. PT skin is warm and dry with spider veins to BLE. No other skin issures noted.

## 2015-09-02 ENCOUNTER — Telehealth: Payer: Self-pay

## 2015-09-02 MED ORDER — OXYCODONE HCL 5 MG PO TABS
5.0000 mg | ORAL_TABLET | ORAL | Status: DC | PRN
Start: 2015-09-02 — End: 2015-12-24

## 2015-09-02 MED ORDER — METOPROLOL TARTRATE 25 MG PO TABS: 25.0000 mg | ORAL_TABLET | Freq: Two times a day (BID) | ORAL | Status: DC | PRN

## 2015-09-02 NOTE — Telephone Encounter (Signed)
Pt wife called, states pt passed out Saturday, was admitted to [facility] and D/C today. She wanted to let us know. Pt was told to f/u with his PCP. She asks if he should schedule with Dr. Rockey Situ also. Please call and advise.

## 2015-09-02 NOTE — Progress Notes (Signed)
Patient: Hector Lucero / Admit Date: 08/31/2015 / Date of Encounter: 09/02/2015, 9:49 AM   Subjective: Feels well this morning. Has ambulated over the past 24 hours without issues. No chest pain, palpitations, SOB, diaphoresis, nausea, or vomiting.   Review of Systems: Review of Systems  Constitutional: Negative for fever, chills, weight loss, malaise/fatigue and diaphoresis.  HENT: Negative for congestion.   Eyes: Negative for discharge and redness.  Respiratory: Negative for cough, sputum production, shortness of breath and wheezing.   Cardiovascular: Negative for chest pain, palpitations, orthopnea, claudication, leg swelling and PND.  Gastrointestinal: Negative for nausea and vomiting.  Musculoskeletal: Negative for myalgias and falls.  Skin: Negative for rash.  Neurological: Negative for dizziness, tingling, tremors, sensory change, speech change, focal weakness, loss of consciousness and weakness.  Endo/Heme/Allergies: Does not bruise/bleed easily.  Psychiatric/Behavioral: The patient is not nervous/anxious.      Objective: Telemetry: NSR, 90's, short episode of ventricular bigeminy overnight Physical Exam: Blood pressure 116/75, pulse 90, temperature 98.2 F (36.8 C), temperature source Oral, resp. rate 18, height 5\' 9"  (1.753 m), weight 138 lb 7.2 oz (62.801 kg), SpO2 94 %. Body mass index is 20.44 kg/(m^2). General: Well developed, well nourished, in no acute distress. Head: Normocephalic, atraumatic, sclera non-icteric, no xanthomas, nares are without discharge. Neck: Negative for carotid bruits. JVP not elevated. Lungs: Clear bilaterally to auscultation without wheezes, rales, or rhonchi. Breathing is unlabored. Heart: RRR S1 S2 without murmurs, rubs, or gallops.  Abdomen: Soft, non-tender, non-distended with normoactive bowel sounds. No rebound/guarding. Extremities: No clubbing or cyanosis. No edema. Distal pedal pulses are 2+ and equal bilaterally. Neuro: Alert  and oriented X 3. Moves all extremities spontaneously. Psych:  Responds to questions appropriately with a normal affect.   Intake/Output Summary (Last 24 hours) at 09/02/15 0949 Last data filed at 09/02/15 0942  Gross per 24 hour  Intake    480 ml  Output   1600 ml  Net  -1120 ml    Inpatient Medications:  . calcium-vitamin D  1 tablet Oral Q breakfast  . heparin  5,000 Units Subcutaneous 3 times per day  . multivitamin with minerals  1 tablet Oral Daily  . omega-3 acid ethyl esters  1 g Oral Daily  . sodium chloride  3 mL Intravenous Q12H   Infusions:    Labs:  Recent Labs  08/31/15 1855  NA 136  K 3.9  CL 101  CO2 26  GLUCOSE 129*  BUN 15  CREATININE 0.69  CALCIUM 9.0  MG 2.2   No results for input(s): AST, ALT, ALKPHOS, BILITOT, PROT, ALBUMIN in the last 72 hours.  Recent Labs  08/31/15 1855  WBC 8.7  HGB 13.6  HCT 40.3  MCV 93.4  PLT 189    Recent Labs  08/30/15 2030 09/01/15 0937 09/01/15 1518 09/01/15 2142  TROPONINI <0.03 <0.03 <0.03 <0.03   Invalid input(s): POCBNP No results for input(s): HGBA1C in the last 72 hours.   Weights: Filed Weights   08/31/15 1838 09/01/15 0119  Weight: 142 lb (64.411 kg) 138 lb 7.2 oz (62.801 kg)     Radiology/Studies:  Ct Head Wo Contrast  08/31/2015  CLINICAL DATA:  Syncope with fall tonight. EXAM: CT HEAD WITHOUT CONTRAST TECHNIQUE: Contiguous axial images were obtained from the base of the skull through the vertex without intravenous contrast. COMPARISON:  11/26/2014 FINDINGS: No intracranial hemorrhage, mass effect, or midline shift. Stable atrophy and chronic small vessel ischemia. Stable remote lacunar infarct in the  right internal capsule. No hydrocephalus. The basilar cisterns are patent. No evidence of territorial infarct. No intracranial fluid collection. Calvarium is intact. There is diffuse mucosal thickening of the ethmoid air cells and right frontal sinus. The mastoid air cells are well aerated.  IMPRESSION: 1.  No acute intracranial abnormality. 2. Stable atrophy and chronic small vessel ischemic change. Electronically Signed   By: Jeb Levering M.D.   On: 08/31/2015 21:09   Dg Humerus Left  08/31/2015  CLINICAL DATA:  Proximal humerus pain after falling this evening. EXAM: LEFT HUMERUS - 2+ VIEW COMPARISON:  None. FINDINGS: There is an impacted mildly displaced fractures to the surgical neck of the humerus. No extension to the glenohumeral joint. Glenohumeral alignment is maintained. The distal humerus is intact. Elbow alignment is maintained. IMPRESSION: Impacted mildly displaced humerus fracture through the surgical neck. Electronically Signed   By: Jeb Levering M.D.   On: 08/31/2015 20:53     Assessment and Plan   1. Syncope: -No further episodes. Ambulated overnight without issues  -Patient was found to have a junctional rhythm with AV dissociation in the ED raising the question of possible arrhythmia earlier leading to his episode of syncope  -On telemetry since being admitted he has been in sinus rhythm, 90's, with a short episode of ventricular bigeminy overnight -Plan for outpatient cardiac monitoring with 30 day event monitor. Given his symptoms there is concern for possible need for PPM  2. History of SVT: -Remote history dating back to 2012 with patient being asymptomatic  -Prior history of 27 episodes with the longest lasting 36 beats at time of Holter monitor -Continue to hold Cardizem  -Doubt SVT lead to the above, suspect the underlying issue at hand is more the junctional rhythm with AV dissociation given his symptoms and his continuation of "not feeling well" leading into the following day -Consider SVT ablation in the future  -Could use prn metoprolol for any episodes of SVT, though would not schedule this   3. HTN: -Stable  4. Ventricular bigeminy: -PRN metoprolol as above   SignedChristell Faith, PA-C Pager: [phone] 09/02/2015, 9:49  AM

## 2015-09-02 NOTE — Progress Notes (Signed)
Pt. Removed C-Pap, stating it was to noisy to wear and he would sleep better without it. Pt. Educated on the risk/benefits of wearing the mask. He stated he understood. Will continue to monitor pt.

## 2015-09-02 NOTE — Care Management Important Message (Signed)
Important Message  Patient Details  Name: Hector Lucero MRN: [id number] Date of Birth: [birth year]   Medicare Important Message Given:  Yes-second notification given    [name] 09/02/2015, 9:31 AM

## 2015-09-02 NOTE — Progress Notes (Signed)
Patient in no distress at this time. Patient awake and answering questions appropriately. Placed patient on auto cpap c1  Room air for night time use. Patient tolerated process well

## 2015-09-02 NOTE — Care Management (Signed)
Patient from the [facility] independent living.  Admitted after syncopal episode in which he fell and sustained right humeral fracture that is requiring sling.  Patient has been ambulating on the unit.  He denies the need to have any discharge services set up in the home.

## 2015-09-02 NOTE — Progress Notes (Signed)
All goals met.  No more syncopal episodes since admission, cardiac workup negative.  VSS.  IV access removed with catheters intact.  Patient being discharged.

## 2015-09-02 NOTE — Discharge Summary (Signed)
Hector Lucero at [facility] NAME: Lucy Woolever    MR#:  [id number]  DATE OF BIRTH:  [birth year]  DATE OF ADMISSION:  08/31/2015 ADMITTING PHYSICIAN: Lytle Butte, MD  DATE OF DISCHARGE: 09/02/2015  2:01 PM  PRIMARY CARE PHYSICIAN: Viviana Simpler, MD    ADMISSION DIAGNOSIS:  Syncope and collapse [R55] Proximal humerus fracture, left, closed, initial encounter [S42.202A]  DISCHARGE DIAGNOSIS:  Principal Problem:   Syncope   SECONDARY DIAGNOSIS:   Past Medical History  Diagnosis Date  . Radial fracture 2006    right, screws and plates  . Ulnar fracture 1995    fall off of ladder  . Hypertension   . SVT (supraventricular tachycardia) ([place])   . HLD (hyperlipidemia)   . OSA (obstructive sleep apnea)   . Fatigue     HOSPITAL COURSE:   81 year old male with past medical history of hypertension, history of SVT, hyperlipidemia, obstructive sleep apnea, who presented to the hospital due to a syncopal episode.  #1 syncope-the exact etiology of syncope is unclear. Likely suspected to be cardiogenic in nature. -Patient CT head on admission was negative. He had a nonfocal neurological exam. -On telemetry he has had no evidence of arrhythmia. As per documentation he was noted to have a second-degree AV block in the ER but not during the hospital course -Patient was seen by cardiology. They recommended discontinuing his Cardizem. He was observed on telemetry and had no evidence of further arrhythmias. He was discharged home off the Cardizem and lisinopril 30 day loop monitor as an outpatient.  #2 left shoulder/humerus fracture-patient has been seen by orthopedics. He will be placed in a sling. -No acute surgical intervention.  -Continue supportive care with oral Tylenol/Motrin and as needed oxycodone for pain follow-up with orthopedics in 2 weeks for an x-ray as outpatient.  #3 osteoporosis-continue calcium and vitamin D supplements  #4  history of SVT-patient was on Cardizem prior to coming into the hospital. This was discontinued due to his syncope and possible cardiogenic source of syncope. He was given a prescription for as needed metoprolol for his SVT episodes.  DISCHARGE CONDITIONS:   Stable  CONSULTS OBTAINED:  Treatment Team:  Lytle Butte, MD Hessie Knows, MD Minna Merritts, MD  DRUG ALLERGIES:  No Known Allergies  DISCHARGE MEDICATIONS:   Discharge Medication List as of 09/02/2015 11:40 AM    START taking these medications   Details  metoprolol tartrate (LOPRESSOR) 25 MG tablet Take 1 tablet (25 mg total) by mouth 2 (two) times daily as needed (Palpitations for HR > 130)., Starting 09/02/2015, Until Discontinued, Print    oxyCODONE (OXY IR/ROXICODONE) 5 MG immediate release tablet Take 1 tablet (5 mg total) by mouth every 4 (four) hours as needed for moderate pain., Starting 09/02/2015, Until Discontinued, Print      CONTINUE these medications which have NOT CHANGED   Details  calcium-vitamin D (OSCAL WITH D) 500-200 MG-UNIT tablet Take 1 tablet by mouth daily with breakfast., Until Discontinued, Historical Med    fish oil-omega-3 fatty acids 1000 MG capsule Take 1,000 mg by mouth daily.  , Until Discontinued, Historical Med    Multiple Vitamins-Minerals (MENS 50+ MULTI VITAMIN/MIN PO) Take 1 tablet by mouth daily., Until Discontinued, Historical Med      STOP taking these medications     diltiazem (CARDIZEM CD) 120 MG 24 hr capsule          DISCHARGE INSTRUCTIONS:   DIET:  Cardiac  diet  DISCHARGE CONDITION:  Stable  ACTIVITY:  Activity as tolerated  OXYGEN:  Home Oxygen: No.   Oxygen Delivery: room air  DISCHARGE LOCATION:  home   If you experience worsening of your admission symptoms, develop shortness of breath, life threatening emergency, suicidal or homicidal thoughts you must seek medical attention immediately by calling 911 or calling your MD immediately  if symptoms  less severe.  You Must read complete instructions/literature along with all the possible adverse reactions/side effects for all the Medicines you take and that have been prescribed to you. Take any new Medicines after you have completely understood and accpet all the possible adverse reactions/side effects.   Please note  You were cared for by a hospitalist during your hospital stay. If you have any questions about your discharge medications or the care you received while you were in the hospital after you are discharged, you can call the unit and asked to speak with the hospitalist on call if the hospitalist that took care of you is not available. Once you are discharged, your primary care physician will handle any further medical issues. Please note that NO REFILLS for any discharge medications will be authorized once you are discharged, as it is imperative that you return to your primary care physician (or establish a relationship with a primary care physician if you do not have one) for your aftercare needs so that they can reassess your need for medications and monitor your lab values.     Today   No further episodes of syncope. No alarms on telemetry. No complaints presently other than left shoulder pain  VITAL SIGNS:  Blood pressure 106/77, pulse 95, temperature 98.5 F (36.9 C), temperature source Oral, resp. rate 18, height 5\' 9"  (1.753 m), weight 62.801 kg (138 lb 7.2 oz), SpO2 95 %.  I/O:   Intake/Output Summary (Last 24 hours) at 09/02/15 1557 Last data filed at 09/02/15 1319  Gross per 24 hour  Intake    480 ml  Output   1725 ml  Net  -1245 ml    PHYSICAL EXAMINATION:   GENERAL: 81 y.o.-year-old patient lying in the bed with no acute distress.  EYES: Pupils equal, round, reactive to light and accommodation. No scleral icterus. Extraocular muscles intact.  HEENT: Head atraumatic, normocephalic. Oropharynx and nasopharynx clear.  NECK: Supple, no jugular venous  distention. No thyroid enlargement, no tenderness.  LUNGS: Normal breath sounds bilaterally, no wheezing, rales, rhonchi. No use of accessory muscles of respiration.  CARDIOVASCULAR: S1, S2 normal. No murmurs, rubs, or gallops.  ABDOMEN: Soft, nontender, nondistended. Bowel sounds present. No organomegaly or mass.  EXTREMITIES: No cyanosis, clubbing or edema b/l. Left shoulder/arm bruising noted.  NEUROLOGIC: Cranial nerves II through XII are intact. No focal Motor or sensory deficits b/l.  PSYCHIATRIC: The patient is alert and oriented x 3. Good affect.  SKIN: No obvious rash, lesion, or ulcer.    DATA REVIEW:   CBC  Recent Labs Lab 08/31/15 1855  WBC 8.7  HGB 13.6  HCT 40.3  PLT 189    Chemistries   Recent Labs Lab 08/31/15 1855  NA 136  K 3.9  CL 101  CO2 26  GLUCOSE 129*  BUN 15  CREATININE 0.69  CALCIUM 9.0  MG 2.2    Cardiac Enzymes  Recent Labs Lab 09/01/15 2142  TROPONINI <0.03    Microbiology Results  Results for orders placed or performed during the hospital encounter of 08/31/15  MRSA PCR Screening  Status: None   Collection Time: 09/01/15  1:28 AM  Result Value Ref Range Status   MRSA by PCR NEGATIVE NEGATIVE Final    RADIOLOGY:  Ct Head Wo Contrast  08/31/2015  CLINICAL DATA:  Syncope with fall tonight. EXAM: CT HEAD WITHOUT CONTRAST TECHNIQUE: Contiguous axial images were obtained from the base of the skull through the vertex without intravenous contrast. COMPARISON:  11/26/2014 FINDINGS: No intracranial hemorrhage, mass effect, or midline shift. Stable atrophy and chronic small vessel ischemia. Stable remote lacunar infarct in the right internal capsule. No hydrocephalus. The basilar cisterns are patent. No evidence of territorial infarct. No intracranial fluid collection. Calvarium is intact. There is diffuse mucosal thickening of the ethmoid air cells and right frontal sinus. The mastoid air cells are well aerated. IMPRESSION: 1.   No acute intracranial abnormality. 2. Stable atrophy and chronic small vessel ischemic change. Electronically Signed   By: Jeb Levering M.D.   On: 08/31/2015 21:09   Dg Humerus Left  08/31/2015  CLINICAL DATA:  Proximal humerus pain after falling this evening. EXAM: LEFT HUMERUS - 2+ VIEW COMPARISON:  None. FINDINGS: There is an impacted mildly displaced fractures to the surgical neck of the humerus. No extension to the glenohumeral joint. Glenohumeral alignment is maintained. The distal humerus is intact. Elbow alignment is maintained. IMPRESSION: Impacted mildly displaced humerus fracture through the surgical neck. Electronically Signed   By: Jeb Levering M.D.   On: 08/31/2015 20:53      Management plans discussed with the patient, family and they are in agreement.  CODE STATUS:     Code Status Orders        Start     Ordered   08/31/15 2244  Full code   Continuous     08/31/15 2244    Advance Directive Documentation        Most Recent Value   Type of Advance Directive  Healthcare Power of Attorney   Pre-existing out of facility DNR order (yellow form or pink MOST form)     "MOST" Form in Place?        TOTAL TIME TAKING CARE OF THIS PATIENT: 40 minutes.    Henreitta Leber M.D on 09/02/2015 at 3:57 PM  Between 7am to 6pm - Pager - [phone]  After 6pm go to [email or web address] - password [email or web address]  Office  [phone]  CC: Primary care physician; Viviana Simpler, MD

## 2015-09-02 NOTE — Telephone Encounter (Signed)
He was consulted in the hospital and should have been scheduled for a TCM appt w/ Dr. Rockey Situ or Thurmond Butts. Can we make that happen?

## 2015-09-03 ENCOUNTER — Telehealth: Payer: Self-pay | Admitting: Internal Medicine

## 2015-09-03 NOTE — Telephone Encounter (Signed)
Spoke to wife this morning He is okay but not great She thinks his cognition is a little worse since this event Has adequate pain control with the oxycodone  Reviewed discharge summary--she was confused about the meds He should be off diltiazem The metoprolol is only for prn use (she hadn't been clear on this) Has appt set for next Monday

## 2015-09-04 ENCOUNTER — Telehealth: Payer: Self-pay | Admitting: *Deleted

## 2015-09-04 ENCOUNTER — Encounter: Payer: Self-pay | Admitting: Physician Assistant

## 2015-09-04 DIAGNOSIS — H34812 Central retinal vein occlusion, left eye, with macular edema: Secondary | ICD-10-CM | POA: Diagnosis not present

## 2015-09-04 NOTE — Telephone Encounter (Signed)
Transitional care call attempted.  Mail box is full, unable to leave a message.  Will continue attempts to reach patient.

## 2015-09-05 DIAGNOSIS — Z23 Encounter for immunization: Secondary | ICD-10-CM | POA: Diagnosis not present

## 2015-09-05 NOTE — Telephone Encounter (Signed)
Transition Care Management Follow-up Telephone Call   Date discharged? 09/02/15   How have you been since you were released from the hospital? Still with bruising, but much improved.   Do you understand why you were in the hospital? yes   Do you understand the discharge instructions? yes   Where were you discharged to? Home   Items Reviewed:  Medications reviewed: yes  Allergies reviewed: yes  Dietary changes reviewed: no  Referrals reviewed: yes, cardiology   Functional Questionnaire:   Activities of Daily Living (ADLs):   He states they are independent in the following: ambulation, bathing and hygiene, feeding, continence, grooming, toileting and dressing States they require assistance with the following: None   Any transportation issues/concerns?: no   Any patient concerns? no   Confirmed importance and date/time of follow-up visits scheduled yes, 09/09/15 @ 1200  Provider Appointment booked with Viviana Simpler, MD  Confirmed with patient if condition begins to worsen call PCP or go to the ER.  Patient was given the office number and encouraged to call back with question or concerns.  : yes

## 2015-09-09 ENCOUNTER — Encounter: Payer: Self-pay | Admitting: Internal Medicine

## 2015-09-09 ENCOUNTER — Ambulatory Visit (INDEPENDENT_AMBULATORY_CARE_PROVIDER_SITE_OTHER): Payer: Medicare Other | Admitting: Internal Medicine

## 2015-09-09 VITALS — BP 118/60 | HR 79 | Temp 97.8°F | Wt 144.0 lb

## 2015-09-09 DIAGNOSIS — S42302A Unspecified fracture of shaft of humerus, left arm, initial encounter for closed fracture: Secondary | ICD-10-CM | POA: Diagnosis not present

## 2015-09-09 DIAGNOSIS — I471 Supraventricular tachycardia: Secondary | ICD-10-CM | POA: Diagnosis not present

## 2015-09-09 DIAGNOSIS — R55 Syncope and collapse: Secondary | ICD-10-CM | POA: Diagnosis not present

## 2015-09-09 NOTE — Patient Instructions (Signed)
Please stop the diltiazem.

## 2015-09-09 NOTE — Progress Notes (Signed)
Pre visit review using our clinic review tool, if applicable. No additional management support is needed unless otherwise documented below in the visit note. 

## 2015-09-09 NOTE — Assessment & Plan Note (Signed)
With left humeral fracture May have overdone it and ?dehydration Concern for bradycardia or hypotension ---- diltiazem stopped but wife still giving it No symptoms since then

## 2015-09-09 NOTE — Progress Notes (Signed)
Subjective:    Patient ID: Hector Lucero, male    DOB: Sep 20, 1928, 81 y.o.   MRN: [id number]  HPI Reviewed hospital records Wife is here He doesn't remember what happened Had done some work outside helping neighbor  Then walked a mile Was heading to the bathroom and just passed out--in [facility] No premonitory symptoms Refused ED evaluation at that point Jilda Roche took him home He was tired and had to lie down Wife took him later--she was worried about him  Left arm didn't hurt too much right away Now with more pain--using the oxycodone when "it is really bad" 1-2 doses per day In sling  Concern for bradycardia so diltiazem stopped Reviewed this by phone with wife --but she didn't seem to understand She had discharge form and my call--but still giving it to him  No further dizziness No palpitations No SOB No chest pain  Current Outpatient Prescriptions on File Prior to Visit  Medication Sig Dispense Refill  . calcium-vitamin D (OSCAL WITH D) 500-200 MG-UNIT tablet Take 1 tablet by mouth daily with breakfast.    . fish oil-omega-3 fatty acids 1000 MG capsule Take 1,000 mg by mouth daily.      . metoprolol tartrate (LOPRESSOR) 25 MG tablet Take 1 tablet (25 mg total) by mouth 2 (two) times daily as needed (Palpitations for HR > 130). 60 tablet 0  . Multiple Vitamins-Minerals (MENS 50+ MULTI VITAMIN/MIN PO) Take 1 tablet by mouth daily.    Marland Kitchen oxyCODONE (OXY IR/ROXICODONE) 5 MG immediate release tablet Take 1 tablet (5 mg total) by mouth every 4 (four) hours as needed for moderate pain. 25 tablet 0   No current facility-administered medications on file prior to visit.    No Known Allergies  Past Medical History  Diagnosis Date  . Radial fracture 2006    right, screws and plates  . Ulnar fracture 1995    fall off of ladder  . Hypertension   . SVT (supraventricular tachycardia) ([place]) 2012    a. Holter in 2012 showed 27 episodes of SVT/atrial tach w/ longest being  36 beats; b. asymptomatic  . HLD (hyperlipidemia)   . OSA (obstructive sleep apnea)   . Fatigue   . Syncope 08/2015    a. short rhythm strip in [facility] ED showed junctional escape rhythm and AV dissociation with essentially sinus bradycardia    Past Surgical History  Procedure Laterality Date  . Hernia repair  Aug 2012    right indirect inguinal, with mesh    Family History  Problem Relation Age of Onset  . Diabetes Mother     Social History   Social History  . Marital Status: Married    Spouse Name: N/A  . Number of Children: 0  . Years of Education: N/A   Occupational History  . Retired     Energy manager.   Social History Main Topics  . Smoking status: Never Smoker   . Smokeless tobacco: Never Used  . Alcohol Use: 1.8 oz/week    3 Glasses of wine per week     Comment: occasional  . Drug Use: No  . Sexual Activity: Not Currently   Other Topics Concern  . Not on file   Social History Narrative   Exercises regularly 5 days weekly at the [facility]   4 degrees-- 2 Bachelor's, MBA and MA      Has living will   Wife is health care POA--- nephew [name]--is alternate  Has DNR already   No tube feeds if cognitively unaware   Review of Systems  Appetite is okay Sleeps okay Still using CPAP No changes in cognition with this event     Objective:   Physical Exam  Constitutional: He appears well-developed and well-nourished. No distress.  Neck: Normal range of motion. Neck supple. No thyromegaly present.  Cardiovascular: Normal rate, regular rhythm and normal heart sounds.  Exam reveals no gallop.   No murmur heard. Rate 72  Lymphadenopathy:    He has no cervical adenopathy.  Skin:  Extensive ecchymoses in left arm/forearm but no tenderness  Arm strength normal  Psychiatric: He has a normal mood and affect. His behavior is normal.          Assessment & Plan:

## 2015-09-09 NOTE — Assessment & Plan Note (Signed)
X-ray shows mild cortical abnormality Will set up with ortho

## 2015-09-09 NOTE — Assessment & Plan Note (Signed)
No apparent recurrence Will stop the diltiazem and have the metoprolol for prn only

## 2015-09-10 ENCOUNTER — Encounter: Payer: Self-pay | Admitting: Physician Assistant

## 2015-09-10 ENCOUNTER — Ambulatory Visit (INDEPENDENT_AMBULATORY_CARE_PROVIDER_SITE_OTHER): Payer: Medicare Other | Admitting: Physician Assistant

## 2015-09-10 VITALS — BP 112/64 | HR 89 | Ht 69.0 in | Wt 142.0 lb

## 2015-09-10 DIAGNOSIS — R55 Syncope and collapse: Secondary | ICD-10-CM

## 2015-09-10 DIAGNOSIS — G4733 Obstructive sleep apnea (adult) (pediatric): Secondary | ICD-10-CM

## 2015-09-10 DIAGNOSIS — S42302A Unspecified fracture of shaft of humerus, left arm, initial encounter for closed fracture: Secondary | ICD-10-CM

## 2015-09-10 DIAGNOSIS — I10 Essential (primary) hypertension: Secondary | ICD-10-CM

## 2015-09-10 DIAGNOSIS — M7989 Other specified soft tissue disorders: Secondary | ICD-10-CM | POA: Diagnosis not present

## 2015-09-10 DIAGNOSIS — I471 Supraventricular tachycardia: Secondary | ICD-10-CM

## 2015-09-10 NOTE — Patient Instructions (Addendum)
Medication Instructions:  Please continue your current medications  Labwork: BMET Magnesium  Testing/Procedures: Your physician has requested that you have an echocardiogram. Echocardiography is a painless test that uses sound waves to create images of your heart. It provides your doctor with information about the size and shape of your heart and how well your heart's chambers and valves are working. This procedure takes approximately one hour. There are no restrictions for this procedure.  Your physician has recommended that you wear an event monitor. Event monitors are medical devices that record the heart's electrical activity. Doctors most often [place] these monitors to diagnose arrhythmias. Arrhythmias are problems with the speed or rhythm of the heartbeat. The monitor is a small, portable device. You can wear one while you do your normal daily activities. This is usually used to diagnose what is causing palpitations/syncope (passing out).  Follow-Up: 6 weeks  Any Other Special Instructions Will Be Listed Below (If Applicable).       If you need a refill on your cardiac medications before your next appointment, please call your pharmacy.  Cardiac Event Monitoring A cardiac event monitor is a small recording device used to help detect abnormal heart rhythms (arrhythmias). The monitor is used to record heart rhythm when noticeable symptoms such as the following occur:  Fast heartbeats (palpitations), such as heart racing or fluttering.  Dizziness.  Fainting or light-headedness.  Unexplained weakness. The monitor is wired to two electrodes placed on your chest. Electrodes are flat, sticky disks that attach to your skin. The monitor can be worn for up to 30 days. You will wear the monitor at all times, except when bathing.  HOW TO USE YOUR CARDIAC EVENT MONITOR A technician will prepare your chest for the electrode placement. The technician will show you how to place the electrodes,  how to work the monitor, and how to replace the batteries. Take time to practice using the monitor before you leave the office. Make sure you understand how to send the information from the monitor to your health care provider. This requires a telephone with a landline, not a cell phone. You need to:  Wear your monitor at all times, except when you are in water:  Do not get the monitor wet.  Take the monitor off when bathing. Do not swim or use a hot tub with it on.  Keep your skin clean. Do not put body lotion or moisturizer on your chest.  Change the electrodes daily or any time they stop sticking to your skin. You might need to use tape to keep them on.  It is possible that your skin under the electrodes could become irritated. To keep this from happening, try to put the electrodes in slightly different places on your chest. However, they must remain in the area under your left breast and in the upper right section of your chest.  Make sure the monitor is safely clipped to your clothing or in a location close to your body that your health care provider recommends.  Press the button to record when you feel symptoms of heart trouble, such as dizziness, weakness, light-headedness, palpitations, thumping, shortness of breath, unexplained weakness, or a fluttering or racing heart. The monitor is always on and records what happened slightly before you pressed the button, so do not worry about being too late to get good information.  Keep a diary of your activities, such as walking, doing chores, and taking medicine. It is especially important to note what you were  doing when you pushed the button to record your symptoms. This will help your health care provider determine what might be contributing to your symptoms. The information stored in your monitor will be reviewed by your health care provider alongside your diary entries.  Send the recorded information as recommended by your health care  provider. It is important to understand that it will take some time for your health care provider to process the results.  Change the batteries as recommended by your health care provider. SEEK IMMEDIATE MEDICAL CARE IF:   You have chest pain.  You have extreme difficulty breathing or shortness of breath.  You develop a very fast heartbeat that persists.  You develop dizziness that does not go away.  You faint or constantly feel you are about to faint.   This information is not intended to replace advice given to you by your health care provider. Make sure you discuss any questions you have with your health care provider.   Document Released: 08/04/2008 Document Revised: 11/16/2014 Document Reviewed: 04/24/2013 Elsevier Interactive Patient Education [employer]. Echocardiogram An echocardiogram, or echocardiography, uses sound waves (ultrasound) to produce an image of your heart. The echocardiogram is simple, painless, obtained within a short period of time, and offers valuable information to your health care provider. The images from an echocardiogram can provide information such as:  Evidence of coronary artery disease (CAD).  Heart size.  Heart muscle function.  Heart valve function.  Aneurysm detection.  Evidence of a past heart attack.  Fluid buildup around the heart.  Heart muscle thickening.  Assess heart valve function. LET [facility] CARE PROVIDER KNOW ABOUT:  Any allergies you have.  All medicines you are taking, including vitamins, herbs, eye drops, creams, and over-the-counter medicines.  Previous problems you or members of your family have had with the use of anesthetics.  Any blood disorders you have.  Previous surgeries you have had.  Medical conditions you have.  Possibility of pregnancy, if this applies. BEFORE THE PROCEDURE  No special preparation is needed. Eat and drink normally.  PROCEDURE   In order to produce an image of your  heart, gel will be applied to your chest and a wand-like tool (transducer) will be moved over your chest. The gel will help transmit the sound waves from the transducer. The sound waves will harmlessly bounce off your heart to allow the heart images to be captured in real-time motion. These images will then be recorded.  You may need an IV to receive a medicine that improves the quality of the pictures. AFTER THE PROCEDURE You may return to your normal schedule including diet, activities, and medicines, unless your health care provider tells you otherwise.   This information is not intended to replace advice given to you by your health care provider. Make sure you discuss any questions you have with your health care provider.   Document Released: 10/23/2000 Document Revised: 11/16/2014 Document Reviewed: 07/03/2013 Elsevier Interactive Patient Education [employer].

## 2015-09-10 NOTE — Progress Notes (Signed)
Cardiology Hospital Follow Up Note:   Date of Encounter: 09/10/2015  ID: Hector Lucero, DOB [birth year], MRN [id number]  PCP: Viviana Simpler, MD Primary Cardiologist: Dr. Rockey Situ, MD  Chief Complaint  Patient presents with  . other    F/u hospital no complaits. Meds reviewed verbally with pt.     HPI:  81 year old male with history of SVT previously on Cardizem CD 120 mg daily, OSA, HLD, HTN, chronic fatigue s/p surgical hernia repair 06/2011 who presents for hospital follow up after recent admission to [facility] from 1022 to 10/24 with syncopal episode while walking into the restroom after walking his usual 1 mile. He was found to have one telemetry stip that appeared to show junctional escape rhythm and AV dissociation with essentially sinus bradycardia.    Back in August 2012 he was noted to be tachycardic during a pre-op evaluation. Holter monitor showed 27 episodes of SVT/atrial tachycardia with the longest episode lasting 36 beats. He was asymptomatic. He has previously been on metoprolol, though was later changed to short acting diltiazem 30 mg tid, and ultimately changed to Cardizem CD 120 mg. He is very active at baseline, going to the gym 3 times per week and walking 1 mile 3 times weekly. He tries to improve his mile times weekly. He is currently down to 21 minute mile. He also does water aerobics with his wife.   On 10/21 he had just finished his usual 1 mile walk, after doing a fair amount of yard work for a neighbor, and was walking into the restroom. Upon getting into the restroom he reported falling. He does not know exactly how long he was on the floor, though he suspects he was on the ground for 1-2 minutes. No presyncopal symptoms. He was found by 2 neighbors that had also been out on the track and walked into the restroom. They got him up and he got to his car and drove home to his wife. He denied any feelings of chest pain, palpitations, diaphoresis, nausea, vomiting, or  presyncope. He continued to not "feel right." Thus, his wife wanted him to come to [facility]. He was unable to describe his feelings of not feeling right any further. This was his first episode of syncope.   At [facility] he was found to have troponin negative x 2, K+ 3.9, Mg++ 2.2, TSH normal, hgb 13.6, SCr 0.69, 1+ ketones on UA, ECG with NSR, 82 bpm, low voltage QRS, no significant st/t changes. ED telemetry strips: NSR with rare PACs, telemetry strip as above in the ED, subsequently on 2A: NSR, 80's to 90's with short episode of ventricular bigeminy. He remained asymptomatic. His Cardizem CD was held. He was placed on prn metoprolol by IM prior to discharge.   Today, he is doing well. He has not had any further syncopal episodes since his hospital discharge. No palpitations, chest pain, SOB, diaphoresis, nausea, vomiting, or syncope. He was continuing to take his diltiazem daily until he recently saw his PCP on 10/31 and this was discontinued. He has not needed any PRN metoprolol. He plans to continue to walk his daily 1 mile. He no longer plans to do yard work. He feels like he is back to his baseline, pre-hospital admission.      Past Medical History  Diagnosis Date  . Radial fracture 2006    right, screws and plates  . Ulnar fracture 1995    fall off of ladder  . Hypertension   . SVT (  supraventricular tachycardia) ([place]) 2012    a. Holter in 2012 showed 27 episodes of SVT/atrial tach w/ longest being 36 beats; b. asymptomatic  . HLD (hyperlipidemia)   . OSA (obstructive sleep apnea)   . Fatigue   . Syncope 08/2015    a. short rhythm strip in [facility] ED showed junctional escape rhythm and AV dissociation with essentially sinus bradycardia  : Past Surgical History  Procedure Laterality Date  . Hernia repair  Aug 2012    right indirect inguinal, with mesh  : Family History  Problem Relation Age of Onset  . Diabetes Mother   :  reports that he has never smoked. He has never used smokeless  tobacco. He reports that he drinks about 1.8 oz of alcohol per week. He reports that he does not use illicit drugs.:   Allergies:  No Known Allergies   Home Medications:  Current Outpatient Prescriptions  Medication Sig Dispense Refill  . calcium-vitamin D (OSCAL WITH D) 500-200 MG-UNIT tablet Take 1 tablet by mouth daily with breakfast.    . fish oil-omega-3 fatty acids 1000 MG capsule Take 1,000 mg by mouth daily.      . Multiple Vitamins-Minerals (MENS 50+ MULTI VITAMIN/MIN PO) Take 1 tablet by mouth daily.    . metoprolol tartrate (LOPRESSOR) 25 MG tablet Take 1 tablet (25 mg total) by mouth 2 (two) times daily as needed (Palpitations for HR > 130). (Patient not taking: Reported on 09/10/2015) 60 tablet 0  . oxyCODONE (OXY IR/ROXICODONE) 5 MG immediate release tablet Take 1 tablet (5 mg total) by mouth every 4 (four) hours as needed for moderate pain. (Patient not taking: Reported on 09/10/2015) 25 tablet 0   No current facility-administered medications for this visit.     Review of Systems:  Review of Systems  Constitutional: Negative for fever, chills, weight loss, malaise/fatigue and diaphoresis.  HENT: Negative for congestion.   Eyes: Negative for discharge and redness.  Respiratory: Negative for cough, sputum production, shortness of breath and wheezing.   Cardiovascular: Negative for chest pain, palpitations, orthopnea, claudication, leg swelling and PND.  Gastrointestinal: Negative for nausea, vomiting and abdominal pain.  Musculoskeletal: Positive for joint pain. Negative for falls.       Left arm with ROM  Skin: Negative for rash.  Neurological: Positive for weakness. Negative for dizziness, tingling, tremors, sensory change, speech change, focal weakness and loss of consciousness.  Psychiatric/Behavioral: The patient is not nervous/anxious.      Physical Exam:  Blood pressure 112/64, pulse 89, height 5\' 9"  (1.753 m), weight 142 lb (64.411 kg). BMI: Body mass index  is 20.96 kg/(m^2). General: Pleasant, NAD. Psych: Normal affect. Responds to questions with normal affect.  Neuro: Alert and oriented X 3. Moves all extremities spontaneously. HEENT: Normocephalic, atraumatic. EOM intact bilaterally. Sclera anicteric.  Neck: Trachea midline. Supple without bruits or JVD. Lungs:  Respirations regular and unlabored, CTA bilaterally without wheezing, crackles, or rhonchi.  Heart: RRR, normal s3, s4. No murmurs, rubs, or gallops.  Abdomen: Soft, non-tender, non-distended, BS + x 4.  Extremities: No clubbing or cyanosis. Trace pitting edema along the bilateral lower extremities to the mid calves. DP/PT/Radials 2+ and equal bilaterally. Left arm with significant bruising and in a sling. Neurovascularly intact with 5/5 grip strength.    Accessory Clinical Findings:  EKG: NSR, 89 bpm, occasional PVCs, low voltage limb leads, no significant st/t changes   Orthostatic vital signs:  Lying: 123/73, pulse 86 bpm Sitting: 111/69, pulse 90 bpm Standing: 126/69,  pulse 97 bpm Standing x 3 min: 135/75, 93 bpm  Recent Labs: 01/23/2015: ALT 14 08/31/2015: BUN 15; Creatinine, Ser 0.69; Hemoglobin 13.6; Magnesium 2.2; Platelets 189; Potassium 3.9; Sodium 136; TSH 1.475     Component Value Date/Time   CHOL 231* 07/19/2014 1442   TRIG 162.0* 07/19/2014 1442   HDL 65.90 07/19/2014 1442   CHOLHDL 4 07/19/2014 1442   VLDL 32.4 07/19/2014 1442   LDLCALC 133* 07/19/2014 1442   LDLDIRECT 131.2 12/05/2013 1103    Weights: Wt Readings from Last 3 Encounters:  09/10/15 142 lb (64.411 kg)  09/09/15 144 lb (65.318 kg)  09/01/15 138 lb 7.2 oz (62.801 kg)    Estimated Creatinine Clearance: 61.5 mL/min (by C-G formula based on Cr of 0.69).   Other studies Reviewed: Additional studies/ records that were reviewed today include: prior office notes and [facility] admission.  Assessment & Plan:  1. History of syncope:  -No further episodes  -Patient reports he has done well since  his hospital discharge and is at his baseline -He was found to have what appeared to be a short rhythm strip in the ED that showed a junctional escape rhythm and AV dissociation with essentially sinus bradycardia  -He had just done a lot of work outdoors for a neighbor, then walked his usual 1 mile prior to his syncopal episode raising the possibility for dehydration as both the patient and his wife report he des not drink much water -Check echo to evaluate LV function and wall motion  -He has previously been asymptomatic with his paroxysmal SVT, thus making this less likely to be the underlying rhtyhm leading to his syncopal episode  -He has continued to take diltiazem since his discharge, though this was held throughout his admission and upon hospital discharge -He was advised by PCP on 10/31 to discontinue diltiazem, agree with this -[employer] 30 day event monitor  -Given his symptoms and the above short rhythm strip observed in the ED there may be concern for possible need for PPM  -Plan for EP evaluation in follow up if indicated  -Check bmet and Mg++  2. History of SVT:  -No known episodes -Has previously been asymptomatic as above  -Doubt SVT led to #1 given the above  -Consider SVT ablation should he have a recurrence of symptoms  -Schedule 30 day event monitor as above -PRN metoprolol   3. HTN: -Well controlled -On PRN metoprolol  4. Lower extremity swelling:  -Check echo to evaluate LV function and wall motion  -Limit salt intake and po fluids -Compression hose  -May ultimately need low dose diuretic   5. Left humeral fracture: -Seeing ortho -In a sling per PCP -Has oxycodone for PRN usage, has not needed any in 3 days   Dispo: -Follow up in approximately 6 weeks (post 30 day event monitor)  Current medicines are reviewed at length with the patient today.  The patient did not have any concerns regarding medicines.   Christell Faith, PA-C [facility] [address] [place], [place] [address] [phone] [place] 09/10/2015, 2:33 PM

## 2015-09-11 ENCOUNTER — Telehealth: Payer: Self-pay

## 2015-09-11 LAB — BASIC METABOLIC PANEL
BUN / CREAT RATIO: 20 (ref 10–22)
BUN: 14 mg/dL (ref 8–27)
CALCIUM: 8.8 mg/dL (ref 8.6–10.2)
CHLORIDE: 103 mmol/L (ref 97–106)
CO2: 23 mmol/L (ref 18–29)
Creatinine, Ser: 0.69 mg/dL — ABNORMAL LOW (ref 0.76–1.27)
GFR calc Af Amer: 100 mL/min/{1.73_m2} (ref >59)
GFR calc non Af Amer: 87 mL/min/{1.73_m2} (ref >59)
GLUCOSE: 160 mg/dL — AB (ref 65–99)
POTASSIUM: 3.9 mmol/L (ref 3.5–5.2)
Sodium: 142 mmol/L (ref 136–144)

## 2015-09-11 LAB — MAGNESIUM: MAGNESIUM: 2.4 mg/dL — AB (ref 1.6–2.3)

## 2015-09-11 NOTE — Telephone Encounter (Signed)
Reviewed lab results and confirmed echo appt w/pt who verbalized understanding.

## 2015-09-16 ENCOUNTER — Telehealth: Payer: Self-pay

## 2015-09-16 NOTE — Telephone Encounter (Signed)
Pt called states he is going to send his monitor back , states Dr. Rockey Situ told him last week "you are in perfect shape", States his HR is averaging 78-79. He does not feel like he needs this.

## 2015-09-16 NOTE — Telephone Encounter (Signed)
Spoke w/ pt.  Advised him that we will need documentation of any arrhythmias before a potential ablation can be scheduled. He states that his wife checks his HR four times a day. Advised him that we cannot take her word for it, that we will need it documented. Discussed w/ him that the monitor will record any arrhythmias in the next 30 days, whether he is aware of them or not and that if he has a run while he is not wearing the monitor, another one would be ordered to catch it.  He states that Dr. Rockey Situ told him at this last ov that he was in good shape for his age and he is not sure what could be done.  Discussed w/ him that ablation was mentioned in Ryan's last note and went over the procedure and it's purpose.  He is agreeable to continuing to wear the monitor, but will discuss w/ his wife and call back to let me know his decision.

## 2015-09-16 NOTE — Telephone Encounter (Signed)
Patient has also suffered a syncopal episode while just walking since he last saw Dr. Rockey Situ. He cannot live in the past. We need to know if his syncopal episode was due to abnormal rhythm. Recommend he wear the event monitor.

## 2015-09-18 ENCOUNTER — Telehealth: Payer: Self-pay | Admitting: *Deleted

## 2015-09-18 NOTE — Telephone Encounter (Signed)
Pt is calling stating he will not be using the [employer] monitor  He is in the process of sending it back. States he already is Checking hr beat 4 times a day The highest 80-78, and does not believe that he needs it He is just letting us know  If we have any questions of concerns please call.

## 2015-09-18 NOTE — Telephone Encounter (Addendum)
Received fax from [employer] that pt called them and requested cancellation of 30 day monitor. Pt has mailed monitor back.  He never applied the monitor and there are no readings to upload.

## 2015-09-18 NOTE — Telephone Encounter (Signed)
Please see previous phone note.  

## 2015-09-18 NOTE — Telephone Encounter (Signed)
Noted. It is my and Dr. Allison Quarry recommendation that he wear outpatient cardiac monitoring given his syncope. Should he have another episode we will be unable to determine if an arrhythmia/pause led to it and he will have to repeat all of this.

## 2015-09-19 DIAGNOSIS — S42292A Other displaced fracture of upper end of left humerus, initial encounter for closed fracture: Secondary | ICD-10-CM | POA: Diagnosis not present

## 2015-09-19 DIAGNOSIS — M79602 Pain in left arm: Secondary | ICD-10-CM | POA: Diagnosis not present

## 2015-09-30 ENCOUNTER — Other Ambulatory Visit: Payer: Self-pay | Admitting: Cardiovascular Disease

## 2015-10-01 ENCOUNTER — Ambulatory Visit (INDEPENDENT_AMBULATORY_CARE_PROVIDER_SITE_OTHER): Payer: Medicare Other

## 2015-10-01 ENCOUNTER — Other Ambulatory Visit: Payer: Self-pay

## 2015-10-01 DIAGNOSIS — M7989 Other specified soft tissue disorders: Secondary | ICD-10-CM | POA: Diagnosis not present

## 2015-10-01 DIAGNOSIS — R55 Syncope and collapse: Secondary | ICD-10-CM | POA: Diagnosis not present

## 2015-10-01 DIAGNOSIS — I10 Essential (primary) hypertension: Secondary | ICD-10-CM

## 2015-10-01 DIAGNOSIS — G4733 Obstructive sleep apnea (adult) (pediatric): Secondary | ICD-10-CM

## 2015-10-01 DIAGNOSIS — I471 Supraventricular tachycardia, unspecified: Secondary | ICD-10-CM

## 2015-10-01 DIAGNOSIS — S42292A Other displaced fracture of upper end of left humerus, initial encounter for closed fracture: Secondary | ICD-10-CM | POA: Diagnosis not present

## 2015-10-01 DIAGNOSIS — S42302A Unspecified fracture of shaft of humerus, left arm, initial encounter for closed fracture: Secondary | ICD-10-CM

## 2015-10-11 DIAGNOSIS — S42292A Other displaced fracture of upper end of left humerus, initial encounter for closed fracture: Secondary | ICD-10-CM | POA: Diagnosis not present

## 2015-10-11 DIAGNOSIS — M25512 Pain in left shoulder: Secondary | ICD-10-CM | POA: Diagnosis not present

## 2015-10-11 DIAGNOSIS — M6281 Muscle weakness (generalized): Secondary | ICD-10-CM | POA: Diagnosis not present

## 2015-10-14 DIAGNOSIS — M6281 Muscle weakness (generalized): Secondary | ICD-10-CM | POA: Diagnosis not present

## 2015-10-14 DIAGNOSIS — S42292A Other displaced fracture of upper end of left humerus, initial encounter for closed fracture: Secondary | ICD-10-CM | POA: Diagnosis not present

## 2015-10-14 DIAGNOSIS — M25512 Pain in left shoulder: Secondary | ICD-10-CM | POA: Diagnosis not present

## 2015-10-17 DIAGNOSIS — S42292A Other displaced fracture of upper end of left humerus, initial encounter for closed fracture: Secondary | ICD-10-CM | POA: Diagnosis not present

## 2015-10-17 DIAGNOSIS — M6281 Muscle weakness (generalized): Secondary | ICD-10-CM | POA: Diagnosis not present

## 2015-10-17 DIAGNOSIS — M25512 Pain in left shoulder: Secondary | ICD-10-CM | POA: Diagnosis not present

## 2015-10-18 DIAGNOSIS — M6281 Muscle weakness (generalized): Secondary | ICD-10-CM | POA: Diagnosis not present

## 2015-10-18 DIAGNOSIS — S42292A Other displaced fracture of upper end of left humerus, initial encounter for closed fracture: Secondary | ICD-10-CM | POA: Diagnosis not present

## 2015-10-18 DIAGNOSIS — M25512 Pain in left shoulder: Secondary | ICD-10-CM | POA: Diagnosis not present

## 2015-10-21 DIAGNOSIS — M25512 Pain in left shoulder: Secondary | ICD-10-CM | POA: Diagnosis not present

## 2015-10-21 DIAGNOSIS — S42292A Other displaced fracture of upper end of left humerus, initial encounter for closed fracture: Secondary | ICD-10-CM | POA: Diagnosis not present

## 2015-10-21 DIAGNOSIS — M6281 Muscle weakness (generalized): Secondary | ICD-10-CM | POA: Diagnosis not present

## 2015-10-22 DIAGNOSIS — S42292A Other displaced fracture of upper end of left humerus, initial encounter for closed fracture: Secondary | ICD-10-CM | POA: Diagnosis not present

## 2015-10-22 DIAGNOSIS — M6281 Muscle weakness (generalized): Secondary | ICD-10-CM | POA: Diagnosis not present

## 2015-10-22 DIAGNOSIS — M25512 Pain in left shoulder: Secondary | ICD-10-CM | POA: Diagnosis not present

## 2015-10-24 ENCOUNTER — Ambulatory Visit: Payer: Medicare Other | Admitting: Physician Assistant

## 2015-10-24 DIAGNOSIS — S42292A Other displaced fracture of upper end of left humerus, initial encounter for closed fracture: Secondary | ICD-10-CM | POA: Diagnosis not present

## 2015-10-24 DIAGNOSIS — M25512 Pain in left shoulder: Secondary | ICD-10-CM | POA: Diagnosis not present

## 2015-10-24 DIAGNOSIS — M6281 Muscle weakness (generalized): Secondary | ICD-10-CM | POA: Diagnosis not present

## 2015-10-28 DIAGNOSIS — M6281 Muscle weakness (generalized): Secondary | ICD-10-CM | POA: Diagnosis not present

## 2015-10-28 DIAGNOSIS — M25512 Pain in left shoulder: Secondary | ICD-10-CM | POA: Diagnosis not present

## 2015-10-28 DIAGNOSIS — S42292A Other displaced fracture of upper end of left humerus, initial encounter for closed fracture: Secondary | ICD-10-CM | POA: Diagnosis not present

## 2015-11-14 DIAGNOSIS — M25512 Pain in left shoulder: Secondary | ICD-10-CM | POA: Diagnosis not present

## 2015-11-14 DIAGNOSIS — M6281 Muscle weakness (generalized): Secondary | ICD-10-CM | POA: Diagnosis not present

## 2015-11-15 DIAGNOSIS — M6281 Muscle weakness (generalized): Secondary | ICD-10-CM | POA: Diagnosis not present

## 2015-11-15 DIAGNOSIS — H34812 Central retinal vein occlusion, left eye, with macular edema: Secondary | ICD-10-CM | POA: Diagnosis not present

## 2015-11-15 DIAGNOSIS — M25512 Pain in left shoulder: Secondary | ICD-10-CM | POA: Diagnosis not present

## 2015-11-18 DIAGNOSIS — M25512 Pain in left shoulder: Secondary | ICD-10-CM | POA: Diagnosis not present

## 2015-11-18 DIAGNOSIS — M6281 Muscle weakness (generalized): Secondary | ICD-10-CM | POA: Diagnosis not present

## 2015-11-19 DIAGNOSIS — M25512 Pain in left shoulder: Secondary | ICD-10-CM | POA: Diagnosis not present

## 2015-11-19 DIAGNOSIS — M6281 Muscle weakness (generalized): Secondary | ICD-10-CM | POA: Diagnosis not present

## 2015-11-22 DIAGNOSIS — M25512 Pain in left shoulder: Secondary | ICD-10-CM | POA: Diagnosis not present

## 2015-11-22 DIAGNOSIS — M6281 Muscle weakness (generalized): Secondary | ICD-10-CM | POA: Diagnosis not present

## 2015-12-09 DIAGNOSIS — Z961 Presence of intraocular lens: Secondary | ICD-10-CM | POA: Diagnosis not present

## 2015-12-17 DIAGNOSIS — M6281 Muscle weakness (generalized): Secondary | ICD-10-CM | POA: Diagnosis not present

## 2015-12-17 DIAGNOSIS — M25512 Pain in left shoulder: Secondary | ICD-10-CM | POA: Diagnosis not present

## 2015-12-23 ENCOUNTER — Telehealth: Payer: Self-pay | Admitting: Internal Medicine

## 2015-12-23 NOTE — Telephone Encounter (Signed)
Patient's wife,Janet,called.  [name] said Dr.Letvak told her to call to discuss patient's upcoming appointment.

## 2015-12-24 ENCOUNTER — Encounter: Payer: Self-pay | Admitting: Cardiovascular Disease

## 2015-12-24 ENCOUNTER — Ambulatory Visit (INDEPENDENT_AMBULATORY_CARE_PROVIDER_SITE_OTHER): Payer: Medicare Other | Admitting: Cardiovascular Disease

## 2015-12-24 VITALS — BP 110/62 | HR 78 | Ht 69.0 in | Wt 136.5 lb

## 2015-12-24 DIAGNOSIS — E119 Type 2 diabetes mellitus without complications: Secondary | ICD-10-CM | POA: Diagnosis not present

## 2015-12-24 DIAGNOSIS — I471 Supraventricular tachycardia: Secondary | ICD-10-CM

## 2015-12-24 DIAGNOSIS — R55 Syncope and collapse: Secondary | ICD-10-CM | POA: Diagnosis not present

## 2015-12-24 DIAGNOSIS — E785 Hyperlipidemia, unspecified: Secondary | ICD-10-CM | POA: Diagnosis not present

## 2015-12-24 NOTE — Assessment & Plan Note (Signed)
Diabetes well controlled, hemoglobin A1c 6.1

## 2015-12-24 NOTE — Assessment & Plan Note (Addendum)
Etiology of his syncope is unclear Possibly orthostatic after a long day gardening, then walked a mile, does not drink much fluids BMP in the emergency room did not appear abnormal He does not want further workup at this time , reports that he is fine Recommended that he call our office if he has any symptoms of near syncope or syncope or orthostasis Suggested he increase his fluid intake  Hospital records reviewed, prior clinic notes reviewed. Discuss with patient and his wife in detail.   Total encounter time more than 25 minutes  Greater than 50% was spent in counseling and coordination of care with the patient

## 2015-12-24 NOTE — Assessment & Plan Note (Signed)
He would like to stay on the diltiazem, did not start metoprolol Suggested he stay on diltiazem but call our office if he has orthostasis symptoms He does not have a blood pressure cuff at home

## 2015-12-24 NOTE — Patient Instructions (Signed)
You are doing well. No medication changes were made.  Please call if you have any more symptoms of lightheadedness or passing out  Please call us if you have new issues that need to be addressed before your next appt.  Your physician wants you to follow-up in: 6 months.  You will receive a reminder letter in the mail two months in advance. If you don't receive a letter, please call our office to schedule the follow-up appointment.

## 2015-12-24 NOTE — Telephone Encounter (Signed)
She notes that his memory is worsening. No longer can drive out of town to see family---will drive locally and get a little "off" Still independent with instrumental ADLs though Will review in more depth at his upcoming appointment

## 2015-12-24 NOTE — Assessment & Plan Note (Signed)
Currently not on a statin. We'll discuss this numbers with him on next visit

## 2015-12-24 NOTE — Telephone Encounter (Signed)
Message left

## 2015-12-24 NOTE — Progress Notes (Signed)
Patient ID: Hector Lucero, male    DOB: October 11, 1928, 82 y.o.   MRN: [id number]  HPI Comments: 82 yo male, patient of Dr. Silvio Lucero,  with a hx of  Hernia, s/p surgical repair,  noted to be tachycardic during his preoperative evaluation,   sent to ER, noted to have a narrow complex tachycardia with rates in the 160's, given IV metoprolol with acute conversion of his rate to the 70s , sent home with  metoprolol succinate 25 mg daily. He reports that he had an uneventful repair of indirect right inguinal hernia, on Aug 27 by Hector Lucero.  He presents today for follow-up of his SVT Notes indicate episode of syncope 09/01/2015  Reports he was gardening on that day, helping his neighbor, "overdid it" reports the wife Did not drink much water that day. Walks a mile, Later heading to the bathroom and had syncope without warning Declined workup in the emergency room at that time Went back home, had to lay down as he was very tired Wife later took him to the emergency room  Workup in the emergency room was relatively unrevealing, relatively normal lab work, negative cardiac enzymes He had follow-up in cardiology clinic and it was recommended he change the diltiazem to metoprolol He did not make this change, and has stayed on diltiazem There was some concern of low blood pressure  He does not have a blood pressure cuff at home, denies any lightheadedness or symptoms concerning for orthostasis since that time Hector Lucero feels well, wife reports he is doing well, does not need further workup  30 day monitor was ordered but they could not figure out how to put this on, looks confusing and extended back. They do not want to do any further workup.  EKG on today's visit shows normal sinus rhythm with rate 78 bpm, no significant ST or T-wave changes  Past medical history reviewed Hemoglobin A1c 6, total cholesterol 230  Holter monitor x48 hours at that time showed 27 episodes of SVT or atrial tachycardia, the  longest episode lasting 36 beats. He was asymptomatic   No Known Allergies  Outpatient Encounter Prescriptions as of 12/24/2015  Medication Sig  . calcium-vitamin D (OSCAL WITH D) 500-200 MG-UNIT tablet Take 1 tablet by mouth daily with breakfast.  . cholecalciferol (VITAMIN D) 1000 units tablet Take 1,000 Units by mouth daily.  Marland Kitchen diltiazem (CARDIZEM) 120 MG tablet Take 120 mg by mouth daily.  . fish oil-omega-3 fatty acids 1000 MG capsule Take 1,000 mg by mouth daily.    . Multiple Vitamins-Minerals (MENS 50+ MULTI VITAMIN/MIN PO) Take 1 tablet by mouth daily.  . [DISCONTINUED] CARTIA XT 120 MG 24 hr capsule Reported on 12/24/2015  . [DISCONTINUED] metoprolol tartrate (LOPRESSOR) 25 MG tablet Take 1 tablet (25 mg total) by mouth 2 (two) times daily as needed (Palpitations for HR > 130). (Patient not taking: Reported on 09/10/2015)  . [DISCONTINUED] metoprolol tartrate (LOPRESSOR) 25 MG tablet TAKE 1 TABLET BY MOUTH TWICE A DAY AS NEEDED (Patient not taking: Reported on 12/24/2015)  . [DISCONTINUED] oxyCODONE (OXY IR/ROXICODONE) 5 MG immediate release tablet Take 1 tablet (5 mg total) by mouth every 4 (four) hours as needed for moderate pain. (Patient not taking: Reported on 09/10/2015)   No facility-administered encounter medications on file as of 12/24/2015.    Past Medical History  Diagnosis Date  . Radial fracture 2006    right, screws and plates  . Ulnar fracture 1995    fall off of  ladder  . Hypertension   . SVT (supraventricular tachycardia) ([place]) 2012    a. Holter in 2012 showed 27 episodes of SVT/atrial tach w/ longest being 36 beats; b. asymptomatic  . HLD (hyperlipidemia)   . OSA (obstructive sleep apnea)   . Fatigue   . Syncope 08/2015    a. short rhythm strip in [facility] ED showed junctional escape rhythm and AV dissociation with essentially sinus bradycardia    Past Surgical History  Procedure Laterality Date  . Hernia repair  Aug 2012    right indirect inguinal, with mesh     Social History  reports that he has never smoked. He has never used smokeless tobacco. He reports that he drinks about 1.8 oz of alcohol per week. He reports that he does not use illicit drugs.  Family History family history includes Diabetes in his mother.       Review of Systems  Constitutional: Negative.   Respiratory: Negative.   Cardiovascular: Negative.   Gastrointestinal: Negative.   Musculoskeletal: Positive for gait problem.  Neurological: Positive for syncope.  Hematological: Negative.   Psychiatric/Behavioral: Negative.   All other systems reviewed and are negative.   BP 110/62 mmHg  Pulse 78  Ht 5\' 9"  (1.753 m)  Wt 136 lb 8 oz (61.916 kg)  BMI 20.15 kg/m2  Physical Exam  Constitutional: He is oriented to person, place, and time. He appears well-developed and well-nourished.  HENT:  Head: Normocephalic.  Nose: Nose normal.  Mouth/Throat: Oropharynx is clear and moist.  Eyes: Conjunctivae are normal. Pupils are equal, round, and reactive to light.  Neck: Normal range of motion. Neck supple. No JVD present.  Cardiovascular: Normal rate, regular rhythm, S1 normal, S2 normal, normal heart sounds and intact distal pulses.  Exam reveals no gallop and no friction rub.   No murmur heard. Pulmonary/Chest: Effort normal and breath sounds normal. No respiratory distress. He has no wheezes. He has no rales. He exhibits no tenderness.  Abdominal: Soft. Bowel sounds are normal. He exhibits no distension. There is no tenderness.  Musculoskeletal: Normal range of motion. He exhibits no edema or tenderness.  Lymphadenopathy:    He has no cervical adenopathy.  Neurological: He is alert and oriented to person, place, and time. Coordination normal.  Skin: Skin is warm and dry. No rash noted. No erythema.  Psychiatric: He has a normal mood and affect. His behavior is normal. Judgment and thought content normal.      Assessment and Plan   Nursing note and vitals  reviewed.

## 2016-01-02 ENCOUNTER — Ambulatory Visit (INDEPENDENT_AMBULATORY_CARE_PROVIDER_SITE_OTHER): Payer: Medicare Other | Admitting: Internal Medicine

## 2016-01-02 ENCOUNTER — Encounter: Payer: Self-pay | Admitting: Internal Medicine

## 2016-01-02 VITALS — BP 92/60 | HR 85 | Temp 97.0°F | Ht 65.5 in | Wt 138.2 lb

## 2016-01-02 DIAGNOSIS — Z0000 Encounter for general adult medical examination without abnormal findings: Secondary | ICD-10-CM | POA: Diagnosis not present

## 2016-01-02 DIAGNOSIS — C61 Malignant neoplasm of prostate: Secondary | ICD-10-CM | POA: Diagnosis not present

## 2016-01-02 DIAGNOSIS — G4733 Obstructive sleep apnea (adult) (pediatric): Secondary | ICD-10-CM

## 2016-01-02 DIAGNOSIS — E119 Type 2 diabetes mellitus without complications: Secondary | ICD-10-CM | POA: Diagnosis not present

## 2016-01-02 DIAGNOSIS — I471 Supraventricular tachycardia: Secondary | ICD-10-CM

## 2016-01-02 DIAGNOSIS — G3184 Mild cognitive impairment, so stated: Secondary | ICD-10-CM

## 2016-01-02 DIAGNOSIS — Z7189 Other specified counseling: Secondary | ICD-10-CM

## 2016-01-02 LAB — CBC WITH DIFFERENTIAL/PLATELET
BASOS ABS: 0 10*3/uL (ref 0.0–0.1)
Basophils Relative: 0.5 % (ref 0.0–3.0)
EOS PCT: 1.2 % (ref 0.0–5.0)
Eosinophils Absolute: 0.1 10*3/uL (ref 0.0–0.7)
HCT: 40.6 % (ref 39.0–52.0)
HEMOGLOBIN: 13.4 g/dL (ref 13.0–17.0)
Lymphocytes Relative: 26.2 % (ref 12.0–46.0)
Lymphs Abs: 1.6 10*3/uL (ref 0.7–4.0)
MCHC: 33.1 g/dL (ref 30.0–36.0)
MCV: 89.5 fl (ref 78.0–100.0)
MONOS PCT: 6.9 % (ref 3.0–12.0)
Monocytes Absolute: 0.4 10*3/uL (ref 0.1–1.0)
Neutro Abs: 4 10*3/uL (ref 1.4–7.7)
Neutrophils Relative %: 65.2 % (ref 43.0–77.0)
Platelets: 239 10*3/uL (ref 150.0–400.0)
RBC: 4.54 Mil/uL (ref 4.22–5.81)
RDW: 14.4 % (ref 11.5–15.5)
WBC: 6.1 10*3/uL (ref 4.0–10.5)

## 2016-01-02 LAB — COMPREHENSIVE METABOLIC PANEL
ALBUMIN: 4 g/dL (ref 3.5–5.2)
ALK PHOS: 84 U/L (ref 39–117)
ALT: 13 U/L (ref 0–53)
AST: 18 U/L (ref 0–37)
BILIRUBIN TOTAL: 0.6 mg/dL (ref 0.2–1.2)
BUN: 16 mg/dL (ref 6–23)
CO2: 28 mEq/L (ref 19–32)
Calcium: 9.2 mg/dL (ref 8.4–10.5)
Chloride: 105 mEq/L (ref 96–112)
Creatinine, Ser: 0.79 mg/dL (ref 0.40–1.50)
GFR: 98.84 mL/min (ref >60.00)
Glucose, Bld: 150 mg/dL — ABNORMAL HIGH (ref 70–99)
POTASSIUM: 3.7 meq/L (ref 3.5–5.1)
Sodium: 140 mEq/L (ref 135–145)
TOTAL PROTEIN: 6.6 g/dL (ref 6.0–8.3)

## 2016-01-02 LAB — PSA: PSA: 5.65 ng/mL — ABNORMAL HIGH (ref 0.10–4.00)

## 2016-01-02 LAB — LIPID PANEL
Cholesterol: 234 mg/dL — ABNORMAL HIGH (ref 0–200)
HDL: 65.7 mg/dL (ref >39.00)
LDL Cholesterol: 132 mg/dL — ABNORMAL HIGH (ref 0–99)
NONHDL: 168.46
TRIGLYCERIDES: 182 mg/dL — AB (ref 0.0–149.0)
Total CHOL/HDL Ratio: 4
VLDL: 36.4 mg/dL (ref 0.0–40.0)

## 2016-01-02 LAB — MICROALBUMIN / CREATININE URINE RATIO
CREATININE, U: 58.3 mg/dL
MICROALB/CREAT RATIO: 1.2 mg/g (ref 0.0–30.0)
Microalb, Ur: <0.7 mg/dL (ref 0.0–1.9)

## 2016-01-02 LAB — HEMOGLOBIN A1C: Hgb A1c MFr Bld: 6.4 % (ref 4.6–6.5)

## 2016-01-02 LAB — HM DIABETES FOOT EXAM

## 2016-01-02 NOTE — Assessment & Plan Note (Signed)
Vs very mild vascular dementia Asked him to start ASA daily Discussed statin--will hold off

## 2016-01-02 NOTE — Progress Notes (Signed)
Pre visit review using our clinic review tool, if applicable. No additional management support is needed unless otherwise documented below in the visit note. 

## 2016-01-02 NOTE — Assessment & Plan Note (Signed)
Doing well with his CPAP

## 2016-01-02 NOTE — Assessment & Plan Note (Signed)
Still on active surveillance only Will check PSA unless functional status really decines

## 2016-01-02 NOTE — Assessment & Plan Note (Signed)
No apparent recurrence On diltiazem

## 2016-01-02 NOTE — Progress Notes (Signed)
Subjective:    Patient ID: Hector Lucero, male    DOB: 01/01/1928, 82 y.o.   MRN: [id number]  HPI Here with wife for Medicare wellness and follow up of chronic medical conditions Reviewed form and advanced directives Reviewed other doctors No tobacco Does have 1-2 drinks a day Vision is fine. Hearing seems fine Did have 1 fall with injury No depression or anhedonia. Does walk daily and does ROM exercises still for his arm Ongoing memory problems Independent with instrumental ADLs still  Wife went to support group meeting for cognitive decline  Wife trying to take over the finances but he keeps changing things  Still drives-- makes wrong turns but then will correct. Doesn't repeat himself--but forgets some details Hasn't given up any tasks  No syncope No dizziness No chest pain or SOB No edema No palpitations  Doesn't check sugars Is careful with his eating No sores or numbness in feet  Current Outpatient Prescriptions on File Prior to Visit  Medication Sig Dispense Refill  . calcium-vitamin D (OSCAL WITH D) 500-200 MG-UNIT tablet Take 1 tablet by mouth daily with breakfast.    . cholecalciferol (VITAMIN D) 1000 units tablet Take 1,000 Units by mouth daily.    Marland Kitchen diltiazem (CARDIZEM) 120 MG tablet Take 120 mg by mouth daily.    . fish oil-omega-3 fatty acids 1000 MG capsule Take 1,000 mg by mouth daily.      . Multiple Vitamins-Minerals (MENS 50+ MULTI VITAMIN/MIN PO) Take 1 tablet by mouth daily.     No current facility-administered medications on file prior to visit.    No Known Allergies  Past Medical History  Diagnosis Date  . Radial fracture 2006    right, screws and plates  . Ulnar fracture 1995    fall off of ladder  . Hypertension   . SVT (supraventricular tachycardia) ([place]) 2012    a. Holter in 2012 showed 27 episodes of SVT/atrial tach w/ longest being 36 beats; b. asymptomatic  . HLD (hyperlipidemia)   . OSA (obstructive sleep apnea)   . Fatigue     . Syncope 08/2015    a. short rhythm strip in [facility] ED showed junctional escape rhythm and AV dissociation with essentially sinus bradycardia    Past Surgical History  Procedure Laterality Date  . Hernia repair  Aug 2012    right indirect inguinal, with mesh    Family History  Problem Relation Age of Onset  . Diabetes Mother     Social History   Social History  . Marital Status: Married    Spouse Name: N/A  . Number of Children: 0  . Years of Education: N/A   Occupational History  . Retired     Energy manager.   Social History Main Topics  . Smoking status: Never Smoker   . Smokeless tobacco: Never Used  . Alcohol Use: 1.8 oz/week    3 Glasses of wine per week     Comment: occasional  . Drug Use: No  . Sexual Activity: Not Currently   Other Topics Concern  . Not on file   Social History Narrative   Exercises regularly 5 days weekly at the [facility] [employer] [facility]   4 degrees-- 2 Bachelor's, MBA and MA      Has living will   Wife is health care POA--- nephew [name]--is alternate   Has DNR already   No tube feeds if cognitively unaware   Review of Systems Occasional diarrhea--will have spots  in shorts at times. Bowels are regular, but loose. Goes 2-3 times per day. No abdominal pain. No blood. Discussed fiber supplement. Sleeps well Weight stable No rash or suspicious skin lesions No back or joint pain Bowels are fine Teeth are fine--- dentist in [facility]--- changing to Dr Myrtie Cruise Wears seat belt    Objective:   Physical Exam  Constitutional: He is oriented to person, place, and time. He appears well-developed and well-nourished. No distress.  HENT:  Mouth/Throat: Oropharynx is clear and moist. No oropharyngeal exudate.  Neck: Normal range of motion. Neck supple. No thyromegaly present.  Cardiovascular: Normal rate, regular rhythm, normal heart sounds and intact distal pulses.  Exam reveals no gallop.   No murmur  heard. Pulmonary/Chest: Effort normal and breath sounds normal. No respiratory distress. He has no wheezes. He has no rales.  Abdominal: Soft. There is no tenderness.  Musculoskeletal: He exhibits no edema or tenderness.  Lymphadenopathy:    He has no cervical adenopathy.  Neurological: He is alert and oriented to person, place, and time.  President-- "[name], Obama, ?" [phone] [id number] Recall 0/3  Normal sensation in feet  Skin: No rash noted. No erythema.  No foot lesions  Psychiatric: He has a normal mood and affect. His behavior is normal.          Assessment & Plan:

## 2016-01-02 NOTE — Assessment & Plan Note (Signed)
Has DNR 

## 2016-01-02 NOTE — Assessment & Plan Note (Signed)
Diet controlled Will check labs

## 2016-01-02 NOTE — Addendum Note (Signed)
Addended by: Marchia Bond on: 01/02/2016 03:11 PM   Modules accepted: Miquel Dunn

## 2016-01-02 NOTE — Assessment & Plan Note (Signed)
I have personally reviewed the Medicare Annual Wellness questionnaire and have noted 1. The patient's medical and social history 2. Their use of alcohol, tobacco or illicit drugs 3. Their current medications and supplements 4. The patient's functional ability including ADL's, fall risks, home safety risks and hearing or visual             impairment. 5. Diet and physical activities 6. Evidence for depression or mood disorders  The patients weight, height, BMI and visual acuity have been recorded in the chart I have made referrals, counseling and provided education to the patient based review of the above and I have provided the pt with a written personalized care plan for preventive services.  I have provided you with a copy of your personalized plan for preventive services. Please take the time to review along with your updated medication list.  UTD on imms  No cancer screening due to age Discussed regular exercise

## 2016-01-02 NOTE — Patient Instructions (Signed)
Please start an aspirin daily -- 81mg 

## 2016-01-03 ENCOUNTER — Encounter: Payer: Self-pay | Admitting: *Deleted

## 2016-01-10 DIAGNOSIS — E119 Type 2 diabetes mellitus without complications: Secondary | ICD-10-CM | POA: Diagnosis not present

## 2016-01-10 DIAGNOSIS — H34812 Central retinal vein occlusion, left eye, with macular edema: Secondary | ICD-10-CM | POA: Diagnosis not present

## 2016-02-03 ENCOUNTER — Other Ambulatory Visit: Payer: Self-pay | Admitting: Cardiovascular Disease

## 2016-02-12 ENCOUNTER — Encounter: Payer: Self-pay | Admitting: Internal Medicine

## 2016-02-12 ENCOUNTER — Ambulatory Visit (INDEPENDENT_AMBULATORY_CARE_PROVIDER_SITE_OTHER): Payer: Medicare Other | Admitting: Internal Medicine

## 2016-02-12 VITALS — BP 112/60 | HR 81 | Temp 97.5°F | Wt 136.0 lb

## 2016-02-12 DIAGNOSIS — R197 Diarrhea, unspecified: Secondary | ICD-10-CM

## 2016-02-12 DIAGNOSIS — R5382 Chronic fatigue, unspecified: Secondary | ICD-10-CM | POA: Diagnosis not present

## 2016-02-12 LAB — CBC
HEMATOCRIT: 38.3 % — AB (ref 39.0–52.0)
HEMOGLOBIN: 13.2 g/dL (ref 13.0–17.0)
MCHC: 34.4 g/dL (ref 30.0–36.0)
MCV: 89.2 fl (ref 78.0–100.0)
PLATELETS: 246 10*3/uL (ref 150.0–400.0)
RBC: 4.29 Mil/uL (ref 4.22–5.81)
RDW: 14.5 % (ref 11.5–15.5)
WBC: 6.2 10*3/uL (ref 4.0–10.5)

## 2016-02-12 LAB — COMPREHENSIVE METABOLIC PANEL
ALT: 13 U/L (ref 0–53)
AST: 19 U/L (ref 0–37)
Albumin: 4 g/dL (ref 3.5–5.2)
Alkaline Phosphatase: 72 U/L (ref 39–117)
BILIRUBIN TOTAL: 0.7 mg/dL (ref 0.2–1.2)
BUN: 18 mg/dL (ref 6–23)
CALCIUM: 9.4 mg/dL (ref 8.4–10.5)
CHLORIDE: 105 meq/L (ref 96–112)
CO2: 28 meq/L (ref 19–32)
Creatinine, Ser: 0.76 mg/dL (ref 0.40–1.50)
GFR: 103.33 mL/min (ref >60.00)
GLUCOSE: 143 mg/dL — AB (ref 70–99)
POTASSIUM: 3.3 meq/L — AB (ref 3.5–5.1)
Sodium: 140 mEq/L (ref 135–145)
Total Protein: 6.7 g/dL (ref 6.0–8.3)

## 2016-02-12 NOTE — Progress Notes (Signed)
Subjective:    Patient ID: Hector Lucero, male    DOB: 14-Aug-1928, 82 y.o.   MRN: [id number]  HPI  Pt presents to the clinic today with c/o diarrhea and fatigue. The diarrhea started 3 weeks ago. It has been intermittent. He reports a slightly looser stool 2 x day that is not watery, greasy, or foul smelling. He denies abdominal pain or cramping, blood in his stool or urinary sxs. He denies changes in diet, recent travel, recent abx use, changes in medicine or recent hospital visits. He cannot assoc the diarrhea to any specific foods and does not have a h/o any food allergies or intolerances. He denies light headedness, SOB, CP, dry moth, or polydipsia  Pt reports he has felt more fatigued since he fractured his humerus 09/2015. He has no difficulty with his PT, and continues to walk 1-1.5 mi daily without increased tiredness or SOB. Pt denies any abnormal bruising. His TSH and B12 levels are WNL.  Review of Systems  Past Medical History  Diagnosis Date  . Radial fracture 2006    right, screws and plates  . Ulnar fracture 1995    fall off of ladder  . Hypertension   . SVT (supraventricular tachycardia) ([place]) 2012    a. Holter in 2012 showed 27 episodes of SVT/atrial tach w/ longest being 36 beats; b. asymptomatic  . HLD (hyperlipidemia)   . OSA (obstructive sleep apnea)   . Fatigue   . Syncope 08/2015    a. short rhythm strip in [facility] ED showed junctional escape rhythm and AV dissociation with essentially sinus bradycardia    Current Outpatient Prescriptions  Medication Sig Dispense Refill  . aspirin EC 81 MG tablet Take 81 mg by mouth daily.    . calcium-vitamin D (OSCAL WITH D) 500-200 MG-UNIT tablet Take 1 tablet by mouth daily with breakfast.    . CARTIA XT 120 MG 24 hr capsule TAKE 1 CAPSULE DAILY 90 capsule 3  . cholecalciferol (VITAMIN D) 1000 units tablet Take 1,000 Units by mouth daily.    . fish oil-omega-3 fatty acids 1000 MG capsule Take 1,000 mg by mouth daily.        . Multiple Vitamins-Minerals (MENS 50+ MULTI VITAMIN/MIN PO) Take 1 tablet by mouth daily.     No current facility-administered medications for this visit.    No Known Allergies  Family History  Problem Relation Age of Onset  . Diabetes Mother     Social History   Social History  . Marital Status: Married    Spouse Name: N/A  . Number of Children: 0  . Years of Education: N/A   Occupational History  . Retired     Energy manager.   Social History Main Topics  . Smoking status: Never Smoker   . Smokeless tobacco: Never Used  . Alcohol Use: 1.8 oz/week    3 Glasses of wine per week     Comment: occasional  . Drug Use: No  . Sexual Activity: Not Currently   Other Topics Concern  . Not on file   Social History Narrative   Exercises regularly 5 days weekly at the [facility]   4 degrees-- 2 Bachelor's, MBA and MA      Has living will   Wife is health care POA--- nephew [name]--is alternate   Has DNR already   No tube feeds if cognitively unaware     Constitutional: Pt reports fatigue. Denies fever, headache or abrupt weight  changes.  Respiratory: Denies difficulty breathing, shortness of breath, cough or sputum production.   Cardiovascular: Denies chest pain, chest tightness, palpitations or swelling in the hands or feet.  Gastrointestinal: Pt reports loose stool. Denies abdominal pain, bloating, constipation, or blood in the stool.  GU: Denies urgency, frequency, pain with urination, burning sensation, blood in urine, odor or discharge. Musculoskeletal: Denies increased muscular weakness  No other specific complaints in a complete review of systems (except as listed in HPI above).     Objective:   Physical Exam BP 112/60 mmHg  Pulse 81  Temp(Src) 97.5 F (36.4 C) (Oral)  Wt 136 lb (61.689 kg)  SpO2 97% Wt Readings from Last 3 Encounters:  02/12/16 136 lb (61.689 kg)  01/02/16 138 lb 4 oz (62.71 kg)  12/24/15 136 lb 8 oz  (61.916 kg)    General: Appears his stated age, in NAD. Skin: Warm, dry and intact. No rashes, lesions or ulcerations noted. Mucous membranes pink and moist Cardiovascular: Normal rate and rhythm. S1,S2 noted.  No murmur, rubs or gallops noted.  Pulmonary/Chest: Normal effort and positive vesicular breath sounds. No respiratory distress. No wheezes, rales or ronchi noted.  Abdomen: Soft and nontender. Normal bowel sounds. No distention or masses noted.  Neurological: Alert and oriented.   BMET    Component Value Date/Time   NA 140 01/02/2016 1449   NA 142 09/10/2015 1441   NA 141 09/09/2014 1337   K 3.7 01/02/2016 1449   K 3.8 09/09/2014 1337   CL 105 01/02/2016 1449   CL 105 09/09/2014 1337   CO2 28 01/02/2016 1449   CO2 28 09/09/2014 1337   GLUCOSE 150* 01/02/2016 1449   GLUCOSE 160* 09/10/2015 1441   GLUCOSE 163* 09/09/2014 1337   BUN 16 01/02/2016 1449   BUN 14 09/10/2015 1441   BUN 17 09/09/2014 1337   CREATININE 0.79 01/02/2016 1449   CREATININE 0.94 09/09/2014 1337   CREATININE 0.77 04/18/2012 0822   CALCIUM 9.2 01/02/2016 1449   CALCIUM 8.4* 09/09/2014 1337   GFRNONAA 87 09/10/2015 1441   GFRNONAA >60 09/09/2014 1337   GFRNONAA >60 05/14/2014 0948   GFRNONAA 85 04/18/2012 0822   GFRAA 100 09/10/2015 1441   GFRAA >60 09/09/2014 1337   GFRAA >60 05/14/2014 0948   GFRAA >89 04/18/2012 0822    Lipid Panel     Component Value Date/Time   CHOL 234* 01/02/2016 1449   TRIG 182.0* 01/02/2016 1449   HDL 65.70 01/02/2016 1449   CHOLHDL 4 01/02/2016 1449   VLDL 36.4 01/02/2016 1449   LDLCALC 132* 01/02/2016 1449    CBC    Component Value Date/Time   WBC 6.1 01/02/2016 1449   WBC 8.4 09/09/2014 1337   RBC 4.54 01/02/2016 1449   RBC 4.39* 09/09/2014 1337   HGB 13.4 01/02/2016 1449   HGB 13.8 09/09/2014 1337   HCT 40.6 01/02/2016 1449   HCT 41.8 09/09/2014 1337   PLT 239.0 01/02/2016 1449   PLT 184 09/09/2014 1337   MCV 89.5 01/02/2016 1449   MCV 95  09/09/2014 1337   MCH 31.5 08/31/2015 1855   MCH 31.5 09/09/2014 1337   MCHC 33.1 01/02/2016 1449   MCHC 33.1 09/09/2014 1337   RDW 14.4 01/02/2016 1449   RDW 13.1 09/09/2014 1337   LYMPHSABS 1.6 01/02/2016 1449   LYMPHSABS 0.7* 09/09/2014 1337   MONOABS 0.4 01/02/2016 1449   MONOABS 0.6 09/09/2014 1337   EOSABS 0.1 01/02/2016 1449   EOSABS 0.0 09/09/2014 1337  BASOSABS 0.0 01/02/2016 1449   BASOSABS 0.0 09/09/2014 1337    Hgb A1C Lab Results  Component Value Date   HGBA1C 6.4 01/02/2016        Assessment & Plan:   Fatigue:  CMP and CBC Discussed with pt, that his may be his new normal It is not interfering with his ADL's  Diarrhea:  Does not sound infectious CMP and CBC  Drink lots of fluids for now Ok to take Imodium or Pepto Bismol  RTC as needed or if symptoms persist or worsen

## 2016-02-12 NOTE — Progress Notes (Signed)
Pre visit review using our clinic review tool, if applicable. No additional management support is needed unless otherwise documented below in the visit note. 

## 2016-02-12 NOTE — Patient Instructions (Signed)

## 2016-02-17 DIAGNOSIS — H34812 Central retinal vein occlusion, left eye, with macular edema: Secondary | ICD-10-CM | POA: Diagnosis not present

## 2016-02-21 ENCOUNTER — Encounter: Payer: Medicare Other | Admitting: Internal Medicine

## 2016-04-01 ENCOUNTER — Encounter: Payer: Self-pay | Admitting: Internal Medicine

## 2016-04-01 ENCOUNTER — Ambulatory Visit (INDEPENDENT_AMBULATORY_CARE_PROVIDER_SITE_OTHER): Payer: Medicare Other | Admitting: Internal Medicine

## 2016-04-01 VITALS — BP 108/62 | HR 86 | Temp 97.6°F | Wt 135.0 lb

## 2016-04-01 DIAGNOSIS — F0150 Vascular dementia without behavioral disturbance: Secondary | ICD-10-CM | POA: Diagnosis not present

## 2016-04-01 NOTE — Assessment & Plan Note (Signed)
Mild progression Told him to drive only with supervision Look into dementia resources No Rx--other than ASA

## 2016-04-01 NOTE — Progress Notes (Signed)
Pre visit review using our clinic review tool, if applicable. No additional management support is needed unless otherwise documented below in the visit note. 

## 2016-04-01 NOTE — Patient Instructions (Addendum)
Please call Lutricia Horsfall at [facility]--- [phone]. She can help you deal with the issues with memory loss. (she is the dementia care coordinator). Speak to Santiago Glad about [employer].

## 2016-04-01 NOTE — Progress Notes (Signed)
Subjective:    Patient ID: Hector Lucero, male    DOB: 03/03/1928, 82 y.o.   MRN: [id number]  HPI Here for follow up of dementia Wife is here  Wife is concerned about worsening memory He recently lost wallet--she is doing all finances He only drives if she is in the car---he will drive on campus and local Did have one episode of urinary incontinence after some drinks [place] Independent with ADLs He helps with vacuuming, laundry, drying dishes (though will misplace things)  Not checking sugars No apparent problems  Current Outpatient Prescriptions on File Prior to Visit  Medication Sig Dispense Refill  . aspirin EC 81 MG tablet Take 81 mg by mouth daily.    . calcium-vitamin D (OSCAL WITH D) 500-200 MG-UNIT tablet Take 1 tablet by mouth daily with breakfast.    . CARTIA XT 120 MG 24 hr capsule TAKE 1 CAPSULE DAILY 90 capsule 3  . cholecalciferol (VITAMIN D) 1000 units tablet Take 1,000 Units by mouth daily.    . fish oil-omega-3 fatty acids 1000 MG capsule Take 1,000 mg by mouth daily.      . Multiple Vitamins-Minerals (MENS 50+ MULTI VITAMIN/MIN PO) Take 1 tablet by mouth daily.     No current facility-administered medications on file prior to visit.    No Known Allergies  Past Medical History  Diagnosis Date  . Radial fracture 2006    right, screws and plates  . Ulnar fracture 1995    fall off of ladder  . Hypertension   . SVT (supraventricular tachycardia) ([place]) 2012    a. Holter in 2012 showed 27 episodes of SVT/atrial tach w/ longest being 36 beats; b. asymptomatic  . HLD (hyperlipidemia)   . OSA (obstructive sleep apnea)   . Fatigue   . Syncope 08/2015    a. short rhythm strip in [facility] ED showed junctional escape rhythm and AV dissociation with essentially sinus bradycardia    Past Surgical History  Procedure Laterality Date  . Hernia repair  Aug 2012    right indirect inguinal, with mesh    Family History  Problem Relation Age of Onset  .  Diabetes Mother     Social History   Social History  . Marital Status: Married    Spouse Name: N/A  . Number of Children: 0  . Years of Education: N/A   Occupational History  . Retired     Energy manager.   Social History Main Topics  . Smoking status: Never Smoker   . Smokeless tobacco: Never Used  . Alcohol Use: 1.8 oz/week    3 Glasses of wine per week     Comment: occasional  . Drug Use: No  . Sexual Activity: Not Currently   Other Topics Concern  . Not on file   Social History Narrative   Exercises regularly 5 days weekly at the [facility] [employer]   4 degrees-- 2 Bachelor's, MBA and MA      Has living will   Wife is health care POA--- nephew [name]--is alternate   Has DNR already   No tube feeds if cognitively unaware   Review of Systems Appetite is off some Weight is stable Sleeps okay in general--uses CPAP    Objective:   Physical Exam  Constitutional: He appears well-developed and well-nourished. No distress.  Psychiatric: He has a normal mood and affect. His behavior is normal.          Assessment &  Plan:

## 2016-04-08 DIAGNOSIS — H34812 Central retinal vein occlusion, left eye, with macular edema: Secondary | ICD-10-CM | POA: Diagnosis not present

## 2016-04-29 ENCOUNTER — Other Ambulatory Visit: Payer: Medicare Other

## 2016-04-29 ENCOUNTER — Other Ambulatory Visit (INDEPENDENT_AMBULATORY_CARE_PROVIDER_SITE_OTHER): Payer: Medicare Other

## 2016-04-29 DIAGNOSIS — Z111 Encounter for screening for respiratory tuberculosis: Secondary | ICD-10-CM | POA: Diagnosis not present

## 2016-05-01 ENCOUNTER — Other Ambulatory Visit: Payer: Medicare Other | Admitting: *Deleted

## 2016-05-01 LAB — TB SKIN TEST
Induration: 0 mm
TB Skin Test: NEGATIVE

## 2016-05-22 DIAGNOSIS — H34812 Central retinal vein occlusion, left eye, with macular edema: Secondary | ICD-10-CM | POA: Diagnosis not present

## 2016-06-22 ENCOUNTER — Ambulatory Visit: Payer: Medicare Other | Admitting: Cardiovascular Disease

## 2016-06-23 ENCOUNTER — Ambulatory Visit (INDEPENDENT_AMBULATORY_CARE_PROVIDER_SITE_OTHER): Payer: Medicare Other | Admitting: Cardiovascular Disease

## 2016-06-23 ENCOUNTER — Encounter: Payer: Self-pay | Admitting: Cardiovascular Disease

## 2016-06-23 VITALS — BP 112/60 | HR 71 | Ht 66.0 in | Wt 136.8 lb

## 2016-06-23 DIAGNOSIS — I471 Supraventricular tachycardia: Secondary | ICD-10-CM | POA: Diagnosis not present

## 2016-06-23 DIAGNOSIS — R55 Syncope and collapse: Secondary | ICD-10-CM | POA: Diagnosis not present

## 2016-06-23 NOTE — Patient Instructions (Addendum)
Medication Instructions:   No medication changes Consider Red yeast rice  Labwork:  No new labs  Testing/Procedures:  No testing needed   Follow-Up: It was a pleasure seeing you in the office today. Please call us if you have new issues that need to be addressed before your next appt.  [phone]  Your physician wants you to follow-up in: 12 months.  You will receive a reminder letter in the mail two months in advance. If you don't receive a letter, please call our office to schedule the follow-up appointment.  If you need a refill on your cardiac medications before your next appointment, please call your pharmacy.

## 2016-06-23 NOTE — Progress Notes (Signed)
Cardiology Office Note  Date:  06/23/2016   ID:  Hector Lucero, DOB 07/17/1928, MRN [id number]  PCP:  Viviana Simpler, MD   Chief Complaint  Patient presents with  . Other    6 month follow up. Meds reviewed by the patient verbally. "doing well."     HPI:  82 yo male, patient of Dr. Silvio Pate,  with a hx of  Hernia, s/p surgical repair,  noted to be tachycardic during his preoperative evaluation,   sent to ER, noted to have a narrow complex tachycardia with rates in the 160's, given IV metoprolol with acute conversion of his rate to the 70s , sent home with  metoprolol succinate 25 mg daily. He reports that he had an uneventful repair of indirect right inguinal hernia, on Aug 27 by Job Founds.  He presents today for follow-up of his SVT Notes indicate episode of syncope 09/01/2015  Feels well,  No sx, active HA1C 6.4 Total chol 234, LDL 132  currently not on a statin,He does have red yeast rice at home Active at home, some gardening Walks a mile a day No near syncope or syncope  Tolerating diltiazem 129 g daily  EKG on today's visit shows normal sinus rhythm with rate 71 bpm, no significant ST or T-wave changes  Other past medical history reviewed  previous 30 day monitor was ordered but they could not figure out how to put this on, looks confusing and extended back. They do not want to do any further workup.  Hemoglobin A1c 6, total cholesterol 230  Holter monitor x48 hours at that time showed 27 episodes of SVT or atrial tachycardia, the longest episode lasting 36 beats. He was asymptomatic   PMH:   has a past medical history of Fatigue; HLD (hyperlipidemia); Hypertension; OSA (obstructive sleep apnea); Radial fracture (2006); SVT (supraventricular tachycardia) ([place]) (2012); Syncope (08/2015); and Ulnar fracture (1995).  PSH:    Past Surgical History:  Procedure Laterality Date  . HERNIA REPAIR  Aug 2012   right indirect inguinal, with mesh    Current Outpatient  Prescriptions  Medication Sig Dispense Refill  . aspirin EC 81 MG tablet Take 81 mg by mouth daily.    . calcium-vitamin D (OSCAL WITH D) 500-200 MG-UNIT tablet Take 1 tablet by mouth daily with breakfast.    . CARTIA XT 120 MG 24 hr capsule TAKE 1 CAPSULE DAILY 90 capsule 3  . cholecalciferol (VITAMIN D) 1000 units tablet Take 1,000 Units by mouth daily.    . fish oil-omega-3 fatty acids 1000 MG capsule Take 1,000 mg by mouth daily.      . Multiple Vitamins-Minerals (MENS 50+ MULTI VITAMIN/MIN PO) Take 1 tablet by mouth daily.     No current facility-administered medications for this visit.      Allergies:   Review of patient's allergies indicates no known allergies.   Social History:  The patient  reports that he has never smoked. He has never used smokeless tobacco. He reports that he drinks about 1.8 oz of alcohol per week . He reports that he does not use drugs.   Family History:   family history includes Diabetes in his mother.    Review of Systems: Review of Systems  Constitutional: Negative.   Respiratory: Negative.   Cardiovascular: Negative.   Gastrointestinal: Negative.   Musculoskeletal: Negative.   Neurological: Negative.   Psychiatric/Behavioral: Negative.   All other systems reviewed and are negative.    PHYSICAL EXAM: VS:  BP 112/60 (BP  Location: Left Arm, Patient Position: Sitting, Cuff Size: Normal)   Pulse 71   Ht 5\' 6"  (1.676 m)   Wt 136 lb 12 oz (62 kg)   BMI 22.07 kg/m  , BMI Body mass index is 22.07 kg/m. GEN: Well nourished, well developed, in no acute distress  HEENT: normal  Neck: no JVD, carotid bruits, or masses Cardiac: RRR; no murmurs, rubs, or gallops,no edema  Respiratory:  clear to auscultation bilaterally, normal work of breathing GI: soft, nontender, nondistended, + BS MS: no deformity or atrophy  Skin: warm and dry, no rash Neuro:  Strength and sensation are intact Psych: euthymic mood, full affect    Recent Labs: 08/31/2015:  TSH 1.475 09/10/2015: Magnesium 2.4 02/12/2016: ALT 13; BUN 18; Creatinine, Ser 0.76; Hemoglobin 13.2; Platelets 246.0; Potassium 3.3; Sodium 140    Lipid Panel Lab Results  Component Value Date   CHOL 234 (H) 01/02/2016   HDL 65.70 01/02/2016   LDLCALC 132 (H) 01/02/2016   TRIG 182.0 (H) 01/02/2016      Wt Readings from Last 3 Encounters:  06/23/16 136 lb 12 oz (62 kg)  04/01/16 135 lb (61.2 kg)  02/12/16 136 lb (61.7 kg)       ASSESSMENT AND PLAN:  SVT (supraventricular tachycardia) ([place]) - Plan: EKG 12-Lead Denies any significant arrhythmia, will continue diltiazem  Syncope No near syncope or syncope episodes Recommended hydration Monitoring blood pressure, call the office for lightheadedness  Hyperlipidemia Interested in CT coronary calcium scoring but unable to drive to [facility]   he is mixed on whether he would like treatment of his cholesterol    Total encounter time more than 15 minutes  Greater than 50% was spent in counseling and coordination of care with the patient    Disposition:   F/U  12 months  Orders Placed This Encounter  Procedures  . EKG 12-Lead     Signed, Esmond Plants, M.D., Ph.D. 06/23/2016  [place], [place]

## 2016-07-02 ENCOUNTER — Ambulatory Visit (INDEPENDENT_AMBULATORY_CARE_PROVIDER_SITE_OTHER): Payer: Medicare Other | Admitting: Internal Medicine

## 2016-07-02 ENCOUNTER — Encounter: Payer: Self-pay | Admitting: Internal Medicine

## 2016-07-02 VITALS — BP 104/58 | HR 88 | Temp 98.7°F | Wt 137.0 lb

## 2016-07-02 DIAGNOSIS — E119 Type 2 diabetes mellitus without complications: Secondary | ICD-10-CM

## 2016-07-02 DIAGNOSIS — F0150 Vascular dementia without behavioral disturbance: Secondary | ICD-10-CM | POA: Diagnosis not present

## 2016-07-02 DIAGNOSIS — I471 Supraventricular tachycardia: Secondary | ICD-10-CM

## 2016-07-02 DIAGNOSIS — G4733 Obstructive sleep apnea (adult) (pediatric): Secondary | ICD-10-CM | POA: Diagnosis not present

## 2016-07-02 LAB — HEMOGLOBIN A1C: HEMOGLOBIN A1C: 6.1 % (ref 4.6–6.5)

## 2016-07-02 NOTE — Assessment & Plan Note (Signed)
Not using the CPAP--he doesn't feel it was helping

## 2016-07-02 NOTE — Patient Instructions (Signed)
Please call Hector Lucero in the dementia care program of [place] [phone]) for help dealing with the memory issues.

## 2016-07-02 NOTE — Assessment & Plan Note (Signed)
Hopefully still good control 

## 2016-07-02 NOTE — Progress Notes (Signed)
Subjective:    Patient ID: Hector Lucero, male    DOB: 05/04/1928, 82 y.o.   MRN: [id number]  HPI Here with wife for follow up of dementia, diabetes and other chronic medical conditions  Wife is concerned about decline He feels "great" He has started at the [facility] once a week--he enjoys it (does complain to wife about some of the other residents in the program) Wife will use the time for shopping, etc Still independent with ADLs and helps around house No incontinence  Not checking sugars No sores, pain or numbness in feet  No chest pain No SOB No dizziness or syncope  Current Outpatient Prescriptions on File Prior to Visit  Medication Sig Dispense Refill  . aspirin EC 81 MG tablet Take 81 mg by mouth daily.    . calcium-vitamin D (OSCAL WITH D) 500-200 MG-UNIT tablet Take 1 tablet by mouth daily with breakfast.    . CARTIA XT 120 MG 24 hr capsule TAKE 1 CAPSULE DAILY 90 capsule 3  . cholecalciferol (VITAMIN D) 1000 units tablet Take 1,000 Units by mouth daily.    . fish oil-omega-3 fatty acids 1000 MG capsule Take 1,000 mg by mouth daily.      . Multiple Vitamins-Minerals (MENS 50+ MULTI VITAMIN/MIN PO) Take 1 tablet by mouth daily.     No current facility-administered medications on file prior to visit.     No Known Allergies  Past Medical History:  Diagnosis Date  . Fatigue   . HLD (hyperlipidemia)   . Hypertension   . OSA (obstructive sleep apnea)   . Radial fracture 2006   right, screws and plates  . SVT (supraventricular tachycardia) ([place]) 2012   a. Holter in 2012 showed 27 episodes of SVT/atrial tach w/ longest being 36 beats; b. asymptomatic  . Syncope 08/2015   a. short rhythm strip in [facility] ED showed junctional escape rhythm and AV dissociation with essentially sinus bradycardia  . Ulnar fracture 1995   fall off of ladder    Past Surgical History:  Procedure Laterality Date  . HERNIA REPAIR  Aug 2012   right indirect inguinal, with mesh    Family  History  Problem Relation Age of Onset  . Diabetes Mother     Social History   Social History  . Marital status: Married    Spouse name: N/A  . Number of children: 0  . Years of education: N/A   Occupational History  . Retired     Energy manager.   Social History Main Topics  . Smoking status: Never Smoker  . Smokeless tobacco: Never Used  . Alcohol use 1.8 oz/week    3 Glasses of wine per week     Comment: occasional  . Drug use: No  . Sexual activity: Not Currently   Other Topics Concern  . Not on file   Social History Narrative   Exercises regularly 5 days weekly at the [facility]   4 degrees-- 2 Bachelor's, MBA and MA      Has living will   Wife is health care POA--- nephew [name]--is alternate   Has DNR already   No tube feeds if cognitively unaware     Review of Systems Rare diarrhea Occasional back and shoulder pain---often falls asleep on couch (discussed trying recliner) Appetite is okay-- weight is stable Sleeps well--not regularly using the CPAP    Objective:   Physical Exam  Constitutional: He appears well-developed and well-nourished. No distress.  Neck: Normal range of motion. Neck supple. No thyromegaly present.  Cardiovascular: Normal rate, regular rhythm, normal heart sounds and intact distal pulses.  Exam reveals no gallop.   No murmur heard. Pulmonary/Chest: Effort normal and breath sounds normal. No respiratory distress. He has no wheezes. He has no rales.  Musculoskeletal: He exhibits no edema.  Lymphadenopathy:    He has no cervical adenopathy.  Neurological:  Normal sensation in feet  Skin: Rash noted.  No foot lesions  Psychiatric: He has a normal mood and affect. His behavior is normal.          Assessment & Plan:

## 2016-07-02 NOTE — Assessment & Plan Note (Signed)
No symptoms on the diltiazem

## 2016-07-02 NOTE — Progress Notes (Signed)
Pre visit review using our clinic review tool, if applicable. No additional management support is needed unless otherwise documented below in the visit note. 

## 2016-07-02 NOTE — Assessment & Plan Note (Signed)
Still mild The respite for wife when he is at [facility] has been helpful--should increase when time available

## 2016-07-03 DIAGNOSIS — H34812 Central retinal vein occlusion, left eye, with macular edema: Secondary | ICD-10-CM | POA: Diagnosis not present

## 2016-08-19 DIAGNOSIS — E119 Type 2 diabetes mellitus without complications: Secondary | ICD-10-CM | POA: Diagnosis not present

## 2016-08-19 LAB — HM DIABETES EYE EXAM

## 2016-08-21 DIAGNOSIS — H34812 Central retinal vein occlusion, left eye, with macular edema: Secondary | ICD-10-CM | POA: Diagnosis not present

## 2016-08-25 ENCOUNTER — Encounter: Payer: Self-pay | Admitting: Internal Medicine

## 2016-09-02 ENCOUNTER — Telehealth: Payer: Self-pay

## 2016-09-02 NOTE — Telephone Encounter (Signed)
Pt left v/m; pt received jury summons and does not want to sit on a jury and request letter to excuse pt from jury duty. Pt request cb.

## 2016-09-03 DIAGNOSIS — Z23 Encounter for immunization: Secondary | ICD-10-CM | POA: Diagnosis not present

## 2016-09-03 NOTE — Telephone Encounter (Signed)
Left message to call office. Need better clarification from pt as to what would keep him from [employer].

## 2016-09-03 NOTE — Telephone Encounter (Signed)
Pt returned you call - please call back  Thanks

## 2016-09-03 NOTE — Telephone Encounter (Signed)
Spoke to pt's wife. Advised her the letter was ready. I will mail it to her.

## 2016-09-03 NOTE — Telephone Encounter (Signed)
Letter written and in Kims' box. 

## 2016-09-03 NOTE — Telephone Encounter (Signed)
Spoke to pt's wife. She said his Madaline Savage Letter needs to be for Vascular Dementia. He is scheduled for November 29,2017. But, he needs to get the letter in as soon as possible. Will forward to Dr. Danise Mina in Dr Alla German absence.

## 2016-09-29 ENCOUNTER — Encounter: Payer: Self-pay | Admitting: Internal Medicine

## 2016-09-29 ENCOUNTER — Ambulatory Visit (INDEPENDENT_AMBULATORY_CARE_PROVIDER_SITE_OTHER): Payer: Medicare Other | Admitting: Internal Medicine

## 2016-09-29 VITALS — BP 114/72 | HR 67 | Temp 97.6°F | Wt 138.0 lb

## 2016-09-29 DIAGNOSIS — E119 Type 2 diabetes mellitus without complications: Secondary | ICD-10-CM

## 2016-09-29 DIAGNOSIS — F0150 Vascular dementia without behavioral disturbance: Secondary | ICD-10-CM | POA: Diagnosis not present

## 2016-09-29 DIAGNOSIS — I471 Supraventricular tachycardia: Secondary | ICD-10-CM

## 2016-09-29 NOTE — Assessment & Plan Note (Signed)
No longer driving alone Fairly stable functionally  Now 2 days per week at [facility] counseled

## 2016-09-29 NOTE — Assessment & Plan Note (Signed)
No symptomatic recurrence

## 2016-09-29 NOTE — Progress Notes (Signed)
Pre visit review using our clinic review tool, if applicable. No additional management support is needed unless otherwise documented below in the visit note. 

## 2016-09-29 NOTE — Progress Notes (Signed)
Subjective:    Patient ID: Hector Lucero, male    DOB: Jul 01, 1928, 82 y.o.   MRN: [id number]  HPI Here with wife for follow up of dementia and other chronic health conditions+  Has to go to an apartment while their villa was being refurbished Was confusing for both of them--but especially husband Forgot that wife had put the checkbook somewhere for safekeeping and went to the bank and told them it was lost. This has finally been straightened out  Mixed ideas about [facility]--- Friday and Saturday Likes Friday but not Saturday due to who is there They still enjoy going out--like [place] for plays, out for dinner Still involved at church Still independent with ADLs and helps with the instrumental ADLs Does get irritable at times--- he fires up her anxiety at times  No chest pain No SOB No palpitations  Not really checking sugars much No apparent problems  Current Outpatient Prescriptions on File Prior to Visit  Medication Sig Dispense Refill  . aspirin EC 81 MG tablet Take 81 mg by mouth daily.    . calcium-vitamin D (OSCAL WITH D) 500-200 MG-UNIT tablet Take 1 tablet by mouth daily with breakfast.    . CARTIA XT 120 MG 24 hr capsule TAKE 1 CAPSULE DAILY 90 capsule 3  . cholecalciferol (VITAMIN D) 1000 units tablet Take 1,000 Units by mouth daily.    . fish oil-omega-3 fatty acids 1000 MG capsule Take 1,000 mg by mouth daily.      . Multiple Vitamins-Minerals (MENS 50+ MULTI VITAMIN/MIN PO) Take 1 tablet by mouth daily.     No current facility-administered medications on file prior to visit.     No Known Allergies  Past Medical History:  Diagnosis Date  . Fatigue   . HLD (hyperlipidemia)   . Hypertension   . OSA (obstructive sleep apnea)   . Radial fracture 2006   right, screws and plates  . SVT (supraventricular tachycardia) ([place]) 2012   a. Holter in 2012 showed 27 episodes of SVT/atrial tach w/ longest being 36 beats; b. asymptomatic  . Syncope 08/2015   a. short  rhythm strip in [facility] ED showed junctional escape rhythm and AV dissociation with essentially sinus bradycardia  . Ulnar fracture 1995   fall off of ladder    Past Surgical History:  Procedure Laterality Date  . HERNIA REPAIR  Aug 2012   right indirect inguinal, with mesh    Family History  Problem Relation Age of Onset  . Diabetes Mother     Social History   Social History  . Marital status: Married    Spouse name: N/A  . Number of children: 0  . Years of education: N/A   Occupational History  . Retired     Energy manager.   Social History Main Topics  . Smoking status: Never Smoker  . Smokeless tobacco: Never Used  . Alcohol use 1.8 oz/week    3 Glasses of wine per week     Comment: occasional  . Drug use: No  . Sexual activity: Not Currently   Other Topics Concern  . Not on file   Social History Narrative   Exercises regularly 5 days weekly at the [facility]   4 degrees-- 2 Bachelor's, MBA and MA      Has living will   Wife is health care POA--- nephew [name]--is alternate   Has DNR already   No tube feeds if cognitively unaware  Review of Systems Walks 1 mile a day at indoor track Appetite is okay--weight stable Sleeps well    Objective:   Physical Exam  Constitutional: He appears well-developed and well-nourished. No distress.  Neck: Normal range of motion. Neck supple. No thyromegaly present.  Cardiovascular: Normal rate, regular rhythm and normal heart sounds.  Exam reveals no gallop.   No murmur heard. Pulmonary/Chest: Effort normal and breath sounds normal. No respiratory distress. He has no wheezes. He has no rales.  Abdominal: Soft. There is no tenderness.  Lymphadenopathy:    He has no cervical adenopathy.  Psychiatric: He has a normal mood and affect. His behavior is normal.          Assessment & Plan:

## 2016-09-29 NOTE — Assessment & Plan Note (Signed)
Lab Results  Component Value Date   HGBA1C 6.1 07/02/2016   Good control Will recheck next time

## 2016-10-09 DIAGNOSIS — H34812 Central retinal vein occlusion, left eye, with macular edema: Secondary | ICD-10-CM | POA: Diagnosis not present

## 2016-11-20 DIAGNOSIS — H34812 Central retinal vein occlusion, left eye, with macular edema: Secondary | ICD-10-CM | POA: Diagnosis not present

## 2017-01-01 DIAGNOSIS — H34812 Central retinal vein occlusion, left eye, with macular edema: Secondary | ICD-10-CM | POA: Diagnosis not present

## 2017-01-04 ENCOUNTER — Encounter (INDEPENDENT_AMBULATORY_CARE_PROVIDER_SITE_OTHER): Payer: Self-pay

## 2017-01-04 ENCOUNTER — Ambulatory Visit (INDEPENDENT_AMBULATORY_CARE_PROVIDER_SITE_OTHER): Payer: Medicare Other | Admitting: Internal Medicine

## 2017-01-04 ENCOUNTER — Encounter: Payer: Self-pay | Admitting: Internal Medicine

## 2017-01-04 VITALS — BP 104/60 | HR 78 | Ht 66.0 in | Wt 138.0 lb

## 2017-01-04 DIAGNOSIS — E119 Type 2 diabetes mellitus without complications: Secondary | ICD-10-CM

## 2017-01-04 DIAGNOSIS — F0150 Vascular dementia without behavioral disturbance: Secondary | ICD-10-CM | POA: Diagnosis not present

## 2017-01-04 DIAGNOSIS — C61 Malignant neoplasm of prostate: Secondary | ICD-10-CM

## 2017-01-04 DIAGNOSIS — Z7189 Other specified counseling: Secondary | ICD-10-CM

## 2017-01-04 DIAGNOSIS — I471 Supraventricular tachycardia: Secondary | ICD-10-CM

## 2017-01-04 DIAGNOSIS — Z0000 Encounter for general adult medical examination without abnormal findings: Secondary | ICD-10-CM | POA: Diagnosis not present

## 2017-01-04 LAB — COMPREHENSIVE METABOLIC PANEL
ALBUMIN: 4.1 g/dL (ref 3.5–5.2)
ALT: 15 U/L (ref 0–53)
AST: 18 U/L (ref 0–37)
Alkaline Phosphatase: 87 U/L (ref 39–117)
BUN: 14 mg/dL (ref 6–23)
CALCIUM: 9.2 mg/dL (ref 8.4–10.5)
CO2: 30 mEq/L (ref 19–32)
Chloride: 104 mEq/L (ref 96–112)
Creatinine, Ser: 0.8 mg/dL (ref 0.40–1.50)
GFR: 97.19 mL/min (ref >60.00)
Glucose, Bld: 139 mg/dL — ABNORMAL HIGH (ref 70–99)
POTASSIUM: 3.7 meq/L (ref 3.5–5.1)
SODIUM: 142 meq/L (ref 135–145)
TOTAL PROTEIN: 6.4 g/dL (ref 6.0–8.3)
Total Bilirubin: 0.8 mg/dL (ref 0.2–1.2)

## 2017-01-04 LAB — LIPID PANEL
CHOLESTEROL: 241 mg/dL — AB (ref 0–200)
HDL: 72.5 mg/dL (ref >39.00)
NonHDL: 168.74
Total CHOL/HDL Ratio: 3
Triglycerides: 238 mg/dL — ABNORMAL HIGH (ref 0.0–149.0)
VLDL: 47.6 mg/dL — AB (ref 0.0–40.0)

## 2017-01-04 LAB — HEMOGLOBIN A1C: Hgb A1c MFr Bld: 6.4 % (ref 4.6–6.5)

## 2017-01-04 LAB — CBC WITH DIFFERENTIAL/PLATELET
Basophils Absolute: 0.1 10*3/uL (ref 0.0–0.1)
Basophils Relative: 0.9 % (ref 0.0–3.0)
EOS PCT: 2.7 % (ref 0.0–5.0)
Eosinophils Absolute: 0.2 10*3/uL (ref 0.0–0.7)
HCT: 40.5 % (ref 39.0–52.0)
HEMOGLOBIN: 13.6 g/dL (ref 13.0–17.0)
LYMPHS PCT: 28.3 % (ref 12.0–46.0)
Lymphs Abs: 1.7 10*3/uL (ref 0.7–4.0)
MCHC: 33.7 g/dL (ref 30.0–36.0)
MCV: 91 fl (ref 78.0–100.0)
MONO ABS: 0.5 10*3/uL (ref 0.1–1.0)
Monocytes Relative: 8.7 % (ref 3.0–12.0)
Neutro Abs: 3.5 10*3/uL (ref 1.4–7.7)
Neutrophils Relative %: 59.4 % (ref 43.0–77.0)
Platelets: 239 10*3/uL (ref 150.0–400.0)
RBC: 4.45 Mil/uL (ref 4.22–5.81)
RDW: 13.6 % (ref 11.5–15.5)
WBC: 5.9 10*3/uL (ref 4.0–10.5)

## 2017-01-04 LAB — PSA: PSA: 6.74 ng/mL — AB (ref 0.10–4.00)

## 2017-01-04 LAB — LDL CHOLESTEROL, DIRECT: LDL DIRECT: 124 mg/dL

## 2017-01-04 LAB — HM DIABETES FOOT EXAM

## 2017-01-04 NOTE — Progress Notes (Signed)
Subjective:    Patient ID: Hector Lucero, male    DOB: [birth year], 83 y.o.   MRN: [id number]  HPI Here with wife for Medicare wellness and follow up of chronic medical conditions Reviewed form and advanced directives Reviewed other doctors Will have occasional beer---at most 1 per day No tobacco Tries to walk daily--generally about a mile No falls No depression or anhedonia Known dementia Does okay with ADLs and many instrumental ADLs--no functional decline  Ongoing memory issues Goes to [employer] 3 days per week (he couldn't remember) He notes at most mild decline-- wife is more concerned She notes slow worsening--variable Still drives -- mostly just with wife and only Acupuncturist (very close) He still does much of the instrumental ADLs Wife does the bills and finances  No palpitations No chest pain or SOB No edema No dizziness or syncope  Hasn't really been checking sugars Asked them to do it occasionally No foot sores, pain or numbness UTD on eye exam  Discussed prostate cancer He wants to continue surveillance for now but would consider Rx if goes up  Current Outpatient Prescriptions on File Prior to Visit  Medication Sig Dispense Refill  . aspirin EC 81 MG tablet Take 81 mg by mouth daily.    . calcium-vitamin D (OSCAL WITH D) 500-200 MG-UNIT tablet Take 1 tablet by mouth daily with breakfast.    . CARTIA XT 120 MG 24 hr capsule TAKE 1 CAPSULE DAILY 90 capsule 3  . cholecalciferol (VITAMIN D) 1000 units tablet Take 1,000 Units by mouth daily.    . fish oil-omega-3 fatty acids 1000 MG capsule Take 1,000 mg by mouth daily.      . Multiple Vitamins-Minerals (MENS 50+ MULTI VITAMIN/MIN PO) Take 1 tablet by mouth daily.     No current facility-administered medications on file prior to visit.     No Known Allergies  Past Medical History:  Diagnosis Date  . Fatigue   . HLD (hyperlipidemia)   . Hypertension   . OSA (obstructive sleep apnea)     . Radial fracture 2006   right, screws and plates  . SVT (supraventricular tachycardia) ([place]) 2012   a. Holter in 2012 showed 27 episodes of SVT/atrial tach w/ longest being 36 beats; b. asymptomatic  . Syncope 08/2015   a. short rhythm strip in [facility] ED showed junctional escape rhythm and AV dissociation with essentially sinus bradycardia  . Ulnar fracture 1995   fall off of ladder    Past Surgical History:  Procedure Laterality Date  . HERNIA REPAIR  Aug 2012   right indirect inguinal, with mesh    Family History  Problem Relation Age of Onset  . Diabetes Mother     Social History   Social History  . Marital status: Married    Spouse name: N/A  . Number of children: 0  . Years of education: N/A   Occupational History  . Retired     Energy manager.   Social History Main Topics  . Smoking status: Never Smoker  . Smokeless tobacco: Never Used  . Alcohol use 1.8 oz/week    3 Glasses of wine per week     Comment: occasional  . Drug use: No  . Sexual activity: Not Currently   Other Topics Concern  . Not on file   Social History Narrative   Exercises regularly 5 days weekly at the [facility]   4 degrees-- 2 Bachelor's, MBA  and MA      Has living will   Wife is health care POA--- nephew [name]--is alternate   Has DNR already   No tube feeds if cognitively unaware   Review of Systems No significant mood issues--he does get quiet in a crowd Sleeps well but has some problems with left shoulder pain No longer seems to have the sleep apnea Appetite is good Weight is stable Wears seat belt Teeth fine---regular with dentist [facility]) Bowels are fine--no blood in stool Voids okay--- flow okay. No incontinence No rash or suspicious lesions No heartburn or dysphagia    Objective:   Physical Exam  Constitutional: He appears well-nourished. No distress.  HENT:  Mouth/Throat: Oropharynx is clear and moist. No oropharyngeal exudate.   Neck: Neck supple. No thyromegaly present.  Cardiovascular: Normal rate, regular rhythm, normal heart sounds and intact distal pulses.  Exam reveals no gallop.   No murmur heard. Pulmonary/Chest: Effort normal and breath sounds normal. No respiratory distress. He has no wheezes. He has no rales.  Abdominal: Soft. There is no tenderness.  Musculoskeletal: He exhibits no edema or tenderness.  Lymphadenopathy:    He has no cervical adenopathy.  Neurological: He is alert.  Thought 2016 President-- "Trump, ?" [id number] Recall 0/3  Normal sensation in feet          Assessment & Plan:

## 2017-01-04 NOTE — Progress Notes (Signed)
Pre visit review using our clinic review tool, if applicable. No additional management support is needed unless otherwise documented below in the visit note. 

## 2017-01-04 NOTE — Assessment & Plan Note (Signed)
Has DNR--rewritten at their request

## 2017-01-04 NOTE — Assessment & Plan Note (Signed)
Asked them to check at least monthly Will do labs here

## 2017-01-04 NOTE — Assessment & Plan Note (Signed)
At most very minimal worsening over the past year Discussed not driving alone Continues at the [employer] 3 days per week On ASA

## 2017-01-04 NOTE — Assessment & Plan Note (Signed)
No symptoms of recurrence on PPI

## 2017-01-04 NOTE — Assessment & Plan Note (Signed)
Still on surveillance since [facility] health Would try Rx if PSA bumps

## 2017-01-04 NOTE — Assessment & Plan Note (Signed)
I have personally reviewed the Medicare Annual Wellness questionnaire and have noted 1. The patient's medical and social history 2. Their use of alcohol, tobacco or illicit drugs 3. Their current medications and supplements 4. The patient's functional ability including ADL's, fall risks, home safety risks and hearing or visual             impairment. 5. Diet and physical activities 6. Evidence for depression or mood disorders  The patients weight, height, BMI and visual acuity have been recorded in the chart I have made referrals, counseling and provided education to the patient based review of the above and I have provided the pt with a written personalized care plan for preventive services.  I have provided you with a copy of your personalized plan for preventive services. Please take the time to review along with your updated medication list.  No cancer screening UTD on imms Discussed ongoing physical and social stimulation

## 2017-01-05 ENCOUNTER — Encounter: Payer: Self-pay | Admitting: *Deleted

## 2017-01-11 ENCOUNTER — Other Ambulatory Visit: Payer: Self-pay | Admitting: Cardiovascular Disease

## 2017-01-12 ENCOUNTER — Telehealth: Payer: Self-pay | Admitting: Internal Medicine

## 2017-01-12 NOTE — Telephone Encounter (Signed)
PT dropped off DMV forms to be filled out. Aware of possible charges. Left in RX tower. [place]

## 2017-01-19 DIAGNOSIS — Z7689 Persons encountering health services in other specified circumstances: Secondary | ICD-10-CM

## 2017-01-19 NOTE — Telephone Encounter (Signed)
Form done Complex so $50 charge. I have recommended a road test to assure that he is safe as a driver

## 2017-01-19 NOTE — Telephone Encounter (Signed)
I left a message on patient's voice mail that form is ready for pick up,$50 charge,and recommendation for road test.

## 2017-01-26 ENCOUNTER — Telehealth: Payer: Self-pay

## 2017-01-26 NOTE — Telephone Encounter (Signed)
Pts wife left v/m; pt received letter for lab results and pt does not have device to ck BS. I spoke with pt and he will have his wife cb about ordering glucose monitor and test strips ad lancets.

## 2017-01-26 NOTE — Telephone Encounter (Signed)
Mrs Murguia left v/m about form sent to [facility] and Mrs Burkland request cb. Left message with Mr Broaddus for his wife to cb.

## 2017-01-27 MED ORDER — ONETOUCH ULTRA SYSTEM W/DEVICE KIT: PACK | 0 refills | Status: DC

## 2017-01-27 MED ORDER — ONETOUCH DELICA LANCETS 33G MISC: 0 refills | Status: DC

## 2017-01-27 MED ORDER — GLUCOSE BLOOD VI STRP: ORAL_STRIP | 1 refills | Status: DC

## 2017-01-27 NOTE — Telephone Encounter (Signed)
Mrs Necaise said pt does not ck his sugar at all. Advised per result note recommended to ck once a month.Mrs. Denardo will call ins co to see which glucose meter, test strips and lancets are approved and will cb with that info.pt [facility] for all prescriptions.

## 2017-01-27 NOTE — Telephone Encounter (Addendum)
[facility] called back and advised one touch ultra meter,strips and delica lancets to [facility]. Done as requested. I called and spoke with pt to let him know diabetic supplies sent to [place]. Pt voiced understanding.

## 2017-01-27 NOTE — Addendum Note (Signed)
Addended by: Helene Shoe on: 01/27/2017 12:28 PM   Modules accepted: Orders

## 2017-02-12 DIAGNOSIS — H34812 Central retinal vein occlusion, left eye, with macular edema: Secondary | ICD-10-CM | POA: Diagnosis not present

## 2017-03-09 ENCOUNTER — Encounter: Payer: Self-pay | Admitting: Internal Medicine

## 2017-03-09 ENCOUNTER — Ambulatory Visit (INDEPENDENT_AMBULATORY_CARE_PROVIDER_SITE_OTHER): Payer: Medicare Other | Admitting: Internal Medicine

## 2017-03-09 DIAGNOSIS — F0150 Vascular dementia without behavioral disturbance: Secondary | ICD-10-CM

## 2017-03-09 NOTE — Progress Notes (Signed)
Pre visit review using our clinic review tool, if applicable. No additional management support is needed unless otherwise documented below in the visit note. 

## 2017-03-09 NOTE — Progress Notes (Signed)
Subjective:    Patient ID: Hector Lucero, male    DOB: 23-Apr-1928, 83 y.o.   MRN: [id number]  HPI Here for follow up of dementia Needs update on forms for adult day care at [facility]  He still goes 3 days per week He states "it is a lot of fun"  Remains generally healthy [place] [address] twice last night though--- he doesn't remember what happened He came out of bedroom (doesn't remember why)--- and fell. Was checked by [facility] staff. Then fell again after getting out of bed--was able to get up without calling for help (but took a long time)  Still does all ADLs Still continent Helps with instrumental ADLs--- does dishes, occasional sweeping, etc  Current Outpatient Prescriptions on File Prior to Visit  Medication Sig Dispense Refill  . aspirin EC 81 MG tablet Take 81 mg by mouth daily.    . Blood Glucose Monitoring Suppl (ONE TOUCH ULTRA SYSTEM KIT) w/Device KIT Check blood sugar once a month and as directed. Dx E11.9 1 each 0  . calcium-vitamin D (OSCAL WITH D) 500-200 MG-UNIT tablet Take 1 tablet by mouth daily with breakfast.    . CARTIA XT 120 MG 24 hr capsule TAKE 1 CAPSULE DAILY 90 capsule 3  . cholecalciferol (VITAMIN D) 1000 units tablet Take 1,000 Units by mouth daily.    . fish oil-omega-3 fatty acids 1000 MG capsule Take 1,000 mg by mouth daily.      Marland Kitchen glucose blood (ONE TOUCH ULTRA TEST) test strip Check blood sugar once a month and as directed. Dx E11.9 25 each 1  . Multiple Vitamins-Minerals (MENS 50+ MULTI VITAMIN/MIN PO) Take 1 tablet by mouth daily.    [facility] DELICA LANCETS 28N MISC Check blood sugar once a month and as directed. Dx E11.9 100 each 0   No current facility-administered medications on file prior to visit.     No Known Allergies  Past Medical History:  Diagnosis Date  . Fatigue   . HLD (hyperlipidemia)   . Hypertension   . OSA (obstructive sleep apnea)   . Radial fracture 2006   right, screws and plates  . SVT (supraventricular tachycardia)  ([place]) 2012   a. Holter in 2012 showed 27 episodes of SVT/atrial tach w/ longest being 36 beats; b. asymptomatic  . Syncope 08/2015   a. short rhythm strip in [facility] showed junctional escape rhythm and AV dissociation with essentially sinus bradycardia  . Ulnar fracture 1995   fall off of ladder    Past Surgical History:  Procedure Laterality Date  . HERNIA REPAIR  Aug 2012   right indirect inguinal, with mesh    Family History  Problem Relation Age of Onset  . Diabetes Mother     Social History   Social History  . Marital status: Married    Spouse name: N/A  . Number of children: 0  . Years of education: N/A   Occupational History  . Retired     Energy manager.   Social History Main Topics  . Smoking status: Never Smoker  . Smokeless tobacco: Never Used  . Alcohol use 1.8 oz/week    3 Glasses of wine per week     Comment: occasional  . Drug use: No  . Sexual activity: Not Currently   Other Topics Concern  . Not on file   Social History Narrative   Exercises regularly 5 days weekly at the [facility]   4 degrees-- 2  Bachelor's, MBA and MA      Has living will   Wife is health care POA--- nephew [facility]ector [facility]ero--is alternate   Has DNR already   No tube feeds if cognitively unaware   Review of Systems  Sleeps fine Appetite is okay Weight stable    Objective:   Physical Exam  Constitutional: He appears well-nourished. No distress.  Neurological:  Appropriate interaction and conversation  Psychiatric: He has a normal mood and affect. His behavior is normal.          Assessment & Plan:

## 2017-03-09 NOTE — Assessment & Plan Note (Signed)
Fairly stable functional status Not sure what happened with falls---discussed safety Forms done for [facility] ---adult day program for dementia

## 2017-03-11 ENCOUNTER — Ambulatory Visit (INDEPENDENT_AMBULATORY_CARE_PROVIDER_SITE_OTHER): Payer: Medicare Other | Admitting: Family Medicine

## 2017-03-11 ENCOUNTER — Encounter: Payer: Self-pay | Admitting: Family Medicine

## 2017-03-11 DIAGNOSIS — R0789 Other chest pain: Secondary | ICD-10-CM

## 2017-03-11 NOTE — Patient Instructions (Signed)
Tylenol is reasonable, along with ice.   Take care.  Glad to see you.  Update [place] as needed.

## 2017-03-11 NOTE — Progress Notes (Signed)
Pre visit review using our clinic review tool, if applicable. No additional management support is needed unless otherwise documented below in the visit note. 

## 2017-03-11 NOTE — Progress Notes (Signed)
Fell on 4/291/8 twice.  He was seen earlier this week but didn't have pain at that time.  Here today for check.  Pain started yesterday.  L midaxillary line.  Comes and goes.  Better with heating pad and cold pack.  No bruising.  No rash.  No leg sx.  No foot numbness.  No head trauma.  No LOC with the falls.  He has gotten rid of fall hazards.    Here today with his wife. She supplements history.  No FCNAVD.  No urinary sx.    Meds, vitals, and allergies reviewed.   ROS: Per HPI unless specifically indicated in ROS section   nad He repeats himself in conversation, this is apparently his baseline. ncat OP wnl Neck supple, no LA rrr ctab Abdomen soft, not tender. Normal bowel sounds. He has a small bruise on the left lower lateral side of the chest wall, at the distal tip of a lower rib, slightly tender there, not severely tender.  Able to bear weight at baseline

## 2017-03-12 DIAGNOSIS — R0789 Other chest pain: Secondary | ICD-10-CM | POA: Insufficient documentation

## 2017-03-12 NOTE — Assessment & Plan Note (Signed)
No ominous findings. No need for imaging as it would likely not change management. Discussed with patient and wife. Okay to use Tylenol. Update [place] as needed.

## 2017-03-19 DIAGNOSIS — H34812 Central retinal vein occlusion, left eye, with macular edema: Secondary | ICD-10-CM | POA: Diagnosis not present

## 2017-05-07 DIAGNOSIS — H34812 Central retinal vein occlusion, left eye, with macular edema: Secondary | ICD-10-CM | POA: Diagnosis not present

## 2017-06-18 DIAGNOSIS — H34812 Central retinal vein occlusion, left eye, with macular edema: Secondary | ICD-10-CM | POA: Diagnosis not present

## 2017-06-24 NOTE — Progress Notes (Signed)
Cardiology Office Note  Date:  06/25/2017   ID:  Hector Lucero, DOB [birth year], MRN [id number]  PCP:  Venia Carbon, MD   Chief Complaint  Patient presents with  . other    1 yr f/u no complaints today. Meds reviewed verbally with pt.    HPI:   83 yo male, with a history of Dementia Hernia, s/p surgical repair,   tachycardic during his preoperative evaluation,    sent to ER, noted to have a narrow complex tachycardia with rates in the 160's/ SVT,  given IV metoprolol with acute conversion of his rate to the 70s ,  sent home with  metoprolol succinate 25 mg daily.  uneventful repair of indirect right inguinal hernia, on Aug 27 by Job Founds.  Notes indicate episode of syncope 09/01/2015 He presents today for follow-up of his SVT  Wife concerned about progressive dementia He is active, exercises daily, walks for 45 minutes without symptoms of chest pain or shortness of breath Likes to do gardening No near syncope or syncope  Denies any episodes of tachycardia concerning for SVT Compliant with his medication  Lab work reviewed with him in detail HA1C 6.4 Total chol 241, LDL 124 PSA 6.74  currently not on a statin,He does have red yeast rice at home  EKG on today's visit shows normal sinus rhythm with rate 82 bpm, no significant ST or T-wave changes  Other past medical history reviewed  previous 30 day monitor was ordered but they could not figure out how to put this on, looks confusing and extended back. They do not want to do any further workup.  Hemoglobin A1c 6, total cholesterol 230  Holter monitor x48 hours at that time showed 27 episodes of SVT or atrial tachycardia, the longest episode lasting 36 beats. He was asymptomatic   PMH:   has a past medical history of Fatigue; HLD (hyperlipidemia); Hypertension; OSA (obstructive sleep apnea); Radial fracture (2006); SVT (supraventricular tachycardia) ([place]) (2012); Syncope (08/2015); and Ulnar fracture  (1995).  PSH:    Past Surgical History:  Procedure Laterality Date  . HERNIA REPAIR  Aug 2012   right indirect inguinal, with mesh    Current Outpatient Prescriptions  Medication Sig Dispense Refill  . calcium-vitamin D (OSCAL WITH D) 500-200 MG-UNIT tablet Take 1 tablet by mouth daily with breakfast.    . CARTIA XT 120 MG 24 hr capsule TAKE 1 CAPSULE DAILY 90 capsule 3  . cholecalciferol (VITAMIN D) 1000 units tablet Take 1,000 Units by mouth daily.    . fish oil-omega-3 fatty acids 1000 MG capsule Take 1,000 mg by mouth daily.      . Multiple Vitamins-Minerals (MENS 50+ MULTI VITAMIN/MIN PO) Take 1 tablet by mouth daily.     No current facility-administered medications for this visit.      Allergies:   Patient has no known allergies.   Social History:  The patient  reports that he has never smoked. He has never used smokeless tobacco. He reports that he drinks about 1.8 oz of alcohol per week . He reports that he does not use drugs.   Family History:   family history includes Diabetes in his mother.    Review of Systems: Review of Systems  Constitutional: Negative.   Respiratory: Negative.   Cardiovascular: Negative.   Gastrointestinal: Negative.   Musculoskeletal: Negative.   Neurological: Negative.   Psychiatric/Behavioral: Negative.   All other systems reviewed and are negative.    PHYSICAL EXAM: VS:  BP  110/60 (BP Location: Left Arm, Patient Position: Sitting, Cuff Size: Normal)   Pulse 82   Ht 5\' 9"  (1.753 m)   Wt 137 lb 8 oz (62.4 kg)   BMI 20.31 kg/m  , BMI Body mass index is 20.31 kg/m.  GEN: Well nourished, well developed, in no acute distress  HEENT: normal  Neck: no JVD, carotid bruits, or masses Cardiac: RRR; no murmurs, rubs, or gallops,no edema  Respiratory:  clear to auscultation bilaterally, normal work of breathing GI: soft, nontender, nondistended, + BS MS: no deformity or atrophy  Skin: warm and dry, no rash Neuro:  Strength and sensation  are intact Psych: euthymic mood, full affect    Recent Labs: 01/04/2017: ALT 15; BUN 14; Creatinine, Ser 0.80; Hemoglobin 13.6; Platelets 239.0; Potassium 3.7; Sodium 142    Lipid Panel Lab Results  Component Value Date   CHOL 241 (H) 01/04/2017   HDL 72.50 01/04/2017   LDLCALC 132 (H) 01/02/2016   TRIG 238.0 (H) 01/04/2017      Wt Readings from Last 3 Encounters:  06/25/17 137 lb 8 oz (62.4 kg)  03/11/17 135 lb 4 oz (61.3 kg)  03/09/17 138 lb 4 oz (62.7 kg)       ASSESSMENT AND PLAN:  SVT (supraventricular tachycardia) ([place]) - Plan: EKG 12-Lead Denies any significant arrhythmia,  continue diltiazem  Syncope No near syncope or syncope episodes Recommended hydration Blood pressure stable  Hyperlipidemia Previously discussed CT coronary calcium score He does not want cholesterol medication  Dementia Stressed importance of regular exercise Wife reports memory issues are stable but slowly progressing    Total encounter time more than 25 minutes  Greater than 50% was spent in counseling and coordination of care with the patient   Disposition:   F/U  12 months   Orders Placed This Encounter  Procedures  . EKG 12-Lead     Signed, Esmond Plants, M.D., Ph.D. 06/25/2017  [place], [place]

## 2017-06-25 ENCOUNTER — Encounter: Payer: Self-pay | Admitting: Cardiovascular Disease

## 2017-06-25 ENCOUNTER — Ambulatory Visit (INDEPENDENT_AMBULATORY_CARE_PROVIDER_SITE_OTHER): Payer: Medicare Other | Admitting: Cardiovascular Disease

## 2017-06-25 VITALS — BP 110/60 | HR 82 | Ht 69.0 in | Wt 137.5 lb

## 2017-06-25 DIAGNOSIS — E119 Type 2 diabetes mellitus without complications: Secondary | ICD-10-CM | POA: Diagnosis not present

## 2017-06-25 DIAGNOSIS — E785 Hyperlipidemia, unspecified: Secondary | ICD-10-CM | POA: Diagnosis not present

## 2017-06-25 DIAGNOSIS — I471 Supraventricular tachycardia: Secondary | ICD-10-CM | POA: Diagnosis not present

## 2017-06-25 DIAGNOSIS — R0789 Other chest pain: Secondary | ICD-10-CM | POA: Diagnosis not present

## 2017-06-25 NOTE — Patient Instructions (Signed)

## 2017-07-07 ENCOUNTER — Ambulatory Visit (INDEPENDENT_AMBULATORY_CARE_PROVIDER_SITE_OTHER): Payer: Medicare Other | Admitting: Internal Medicine

## 2017-07-07 ENCOUNTER — Encounter: Payer: Self-pay | Admitting: Internal Medicine

## 2017-07-07 VITALS — BP 98/60 | HR 75 | Temp 98.4°F | Wt 137.0 lb

## 2017-07-07 DIAGNOSIS — C61 Malignant neoplasm of prostate: Secondary | ICD-10-CM

## 2017-07-07 DIAGNOSIS — I471 Supraventricular tachycardia: Secondary | ICD-10-CM

## 2017-07-07 DIAGNOSIS — E119 Type 2 diabetes mellitus without complications: Secondary | ICD-10-CM | POA: Diagnosis not present

## 2017-07-07 DIAGNOSIS — F0150 Vascular dementia without behavioral disturbance: Secondary | ICD-10-CM | POA: Diagnosis not present

## 2017-07-07 LAB — HEMOGLOBIN A1C: Hgb A1c MFr Bld: 6.4 % (ref 4.6–6.5)

## 2017-07-07 LAB — PSA: PSA: 6.71 ng/mL — AB (ref 0.10–4.00)

## 2017-07-07 MED ORDER — ASPIRIN EC 81 MG PO TBEC: 81.0000 mg | DELAYED_RELEASE_TABLET | ORAL | 0 refills | Status: DC

## 2017-07-07 NOTE — Assessment & Plan Note (Signed)
Hopefully still good control 

## 2017-07-07 NOTE — Assessment & Plan Note (Addendum)
Seems to be stable functionally and cognitively Will start low dose aspirin

## 2017-07-07 NOTE — Patient Instructions (Signed)
Please start an 81mg  aspirin every other day.

## 2017-07-07 NOTE — Progress Notes (Signed)
Subjective:    Patient ID: Hector Lucero, male    DOB: 1927-12-15, 83 y.o.   MRN: [id number]  HPI Here with wife for follow up of diabetes, dementia and other chronic health conditions  He feels "great" Wife notes same problems with memory--no worse [place] 3 days per week--"it's okay" Still independent with ADLs and helps with instrumental ADLs  Not checking sugars Tries to eat healthy Weight is stable  Discussed the prostate cancer again He would consider Rx if going up more  No chest pain No palpitations No SOB Sees cardiologist yearly  Current Outpatient Prescriptions on File Prior to Visit  Medication Sig Dispense Refill  . calcium-vitamin D (OSCAL WITH D) 500-200 MG-UNIT tablet Take 1 tablet by mouth daily with breakfast.    . CARTIA XT 120 MG 24 hr capsule TAKE 1 CAPSULE DAILY 90 capsule 3  . cholecalciferol (VITAMIN D) 1000 units tablet Take 1,000 Units by mouth daily.    . fish oil-omega-3 fatty acids 1000 MG capsule Take 1,000 mg by mouth daily.      . Multiple Vitamins-Minerals (MENS 50+ MULTI VITAMIN/MIN PO) Take 1 tablet by mouth daily.     No current facility-administered medications on file prior to visit.     No Known Allergies  Past Medical History:  Diagnosis Date  . Fatigue   . HLD (hyperlipidemia)   . Hypertension   . OSA (obstructive sleep apnea)   . Radial fracture 2006   right, screws and plates  . SVT (supraventricular tachycardia) ([place]) 2012   a. Holter in 2012 showed 27 episodes of SVT/atrial tach w/ longest being 36 beats; b. asymptomatic  . Syncope 08/2015   a. short rhythm strip in [facility] ED showed junctional escape rhythm and AV dissociation with essentially sinus bradycardia  . Ulnar fracture 1995   fall off of ladder    Past Surgical History:  Procedure Laterality Date  . HERNIA REPAIR  Aug 2012   right indirect inguinal, with mesh    Family History  Problem Relation Age of Onset  . Diabetes Mother     Social History    Social History  . Marital status: Married    Spouse name: N/A  . Number of children: 0  . Years of education: N/A   Occupational History  . Retired     Energy manager.   Social History Main Topics  . Smoking status: Never Smoker  . Smokeless tobacco: Never Used  . Alcohol use 1.8 oz/week    3 Glasses of wine per week     Comment: occasional  . Drug use: No  . Sexual activity: Not Currently   Other Topics Concern  . Not on file   Social History Narrative   Exercises regularly 5 days weekly at the [facility]   4 degrees-- 2 Bachelor's, MBA and MA      Has living will   Wife is health care POA--- nephew [name]--is alternate   Has DNR already   No tube feeds if cognitively unaware   Review of Systems Sleeps fine Mood is good Bowels are fine    Objective:   Physical Exam  Constitutional: He appears well-nourished. No distress.  Neck: No thyromegaly present.  Cardiovascular: Normal rate, regular rhythm, normal heart sounds and intact distal pulses.  Exam reveals no gallop.   No murmur heard. Pulmonary/Chest: Effort normal and breath sounds normal. No respiratory distress. He has no wheezes. He has no rales.  Musculoskeletal: He exhibits no edema or tenderness.  Lymphadenopathy:    He has no cervical adenopathy.  Skin:  No foot lesions  Psychiatric: He has a normal mood and affect. His behavior is normal.          Assessment & Plan:

## 2017-07-07 NOTE — Assessment & Plan Note (Signed)
No symptomatic recurrence on diltiazem

## 2017-07-07 NOTE — Assessment & Plan Note (Signed)
Would refer for consideration of lupron if sig increase

## 2017-07-19 DIAGNOSIS — E119 Type 2 diabetes mellitus without complications: Secondary | ICD-10-CM | POA: Diagnosis not present

## 2017-07-19 LAB — HM DIABETES EYE EXAM

## 2017-07-22 ENCOUNTER — Encounter: Payer: Self-pay | Admitting: Internal Medicine

## 2017-08-02 DIAGNOSIS — H34812 Central retinal vein occlusion, left eye, with macular edema: Secondary | ICD-10-CM | POA: Diagnosis not present

## 2017-08-04 NOTE — Telephone Encounter (Signed)
Referral

## 2017-09-02 DIAGNOSIS — Z23 Encounter for immunization: Secondary | ICD-10-CM | POA: Diagnosis not present

## 2017-09-13 DIAGNOSIS — H34812 Central retinal vein occlusion, left eye, with macular edema: Secondary | ICD-10-CM | POA: Diagnosis not present

## 2017-10-04 DIAGNOSIS — H34812 Central retinal vein occlusion, left eye, with macular edema: Secondary | ICD-10-CM | POA: Diagnosis not present

## 2017-10-25 DIAGNOSIS — H34812 Central retinal vein occlusion, left eye, with macular edema: Secondary | ICD-10-CM | POA: Diagnosis not present

## 2017-11-04 ENCOUNTER — Telehealth: Payer: Self-pay | Admitting: Internal Medicine

## 2017-11-04 NOTE — Telephone Encounter (Signed)
Copied from [place]. Topic: Inquiry >> Nov 04, 2017 10:50 AM Conception Chancy, NT wrote: Reason for CRM: Lucas Mallow is calling from [place] and states pt is complaining of back and shoulder pain, and she would like to know if there is anything he can be given for that.   Call back : [phone] or can contact the wife at

## 2017-11-04 NOTE — Telephone Encounter (Signed)
Okay to give him 2 regular tylenol (2 of the 325mg  tabs)

## 2017-11-04 NOTE — Telephone Encounter (Signed)
Spoke to [facility]. Advised her that he can take 2 regular strength Tylenol

## 2017-11-15 ENCOUNTER — Ambulatory Visit: Payer: Medicare Other | Admitting: Internal Medicine

## 2017-12-07 DIAGNOSIS — F39 Unspecified mood [affective] disorder: Secondary | ICD-10-CM | POA: Diagnosis not present

## 2017-12-07 DIAGNOSIS — I471 Supraventricular tachycardia: Secondary | ICD-10-CM | POA: Diagnosis not present

## 2017-12-07 DIAGNOSIS — F0150 Vascular dementia without behavioral disturbance: Secondary | ICD-10-CM | POA: Diagnosis not present

## 2017-12-07 DIAGNOSIS — E119 Type 2 diabetes mellitus without complications: Secondary | ICD-10-CM | POA: Diagnosis not present

## 2017-12-07 DIAGNOSIS — C61 Malignant neoplasm of prostate: Secondary | ICD-10-CM | POA: Diagnosis not present

## 2017-12-13 DIAGNOSIS — H34812 Central retinal vein occlusion, left eye, with macular edema: Secondary | ICD-10-CM | POA: Diagnosis not present

## 2017-12-29 DIAGNOSIS — I471 Supraventricular tachycardia: Secondary | ICD-10-CM | POA: Diagnosis not present

## 2017-12-29 DIAGNOSIS — C61 Malignant neoplasm of prostate: Secondary | ICD-10-CM | POA: Diagnosis not present

## 2017-12-29 DIAGNOSIS — F0150 Vascular dementia without behavioral disturbance: Secondary | ICD-10-CM | POA: Diagnosis not present

## 2017-12-29 DIAGNOSIS — E119 Type 2 diabetes mellitus without complications: Secondary | ICD-10-CM | POA: Diagnosis not present

## 2017-12-29 DIAGNOSIS — F39 Unspecified mood [affective] disorder: Secondary | ICD-10-CM | POA: Diagnosis not present

## 2018-02-02 DIAGNOSIS — F39 Unspecified mood [affective] disorder: Secondary | ICD-10-CM | POA: Diagnosis not present

## 2018-02-02 DIAGNOSIS — F0150 Vascular dementia without behavioral disturbance: Secondary | ICD-10-CM | POA: Diagnosis not present

## 2018-02-02 DIAGNOSIS — I471 Supraventricular tachycardia: Secondary | ICD-10-CM | POA: Diagnosis not present

## 2018-02-02 DIAGNOSIS — E1100 Type 2 diabetes mellitus with hyperosmolarity without nonketotic hyperglycemic-hyperosmolar coma (NKHHC): Secondary | ICD-10-CM | POA: Diagnosis not present

## 2018-02-02 DIAGNOSIS — C61 Malignant neoplasm of prostate: Secondary | ICD-10-CM | POA: Diagnosis not present

## 2018-02-07 DIAGNOSIS — H34812 Central retinal vein occlusion, left eye, with macular edema: Secondary | ICD-10-CM | POA: Diagnosis not present

## 2018-03-01 DIAGNOSIS — F39 Unspecified mood [affective] disorder: Secondary | ICD-10-CM | POA: Diagnosis not present

## 2018-03-01 DIAGNOSIS — I471 Supraventricular tachycardia: Secondary | ICD-10-CM | POA: Diagnosis not present

## 2018-03-01 DIAGNOSIS — Z8546 Personal history of malignant neoplasm of prostate: Secondary | ICD-10-CM | POA: Diagnosis not present

## 2018-03-01 DIAGNOSIS — E119 Type 2 diabetes mellitus without complications: Secondary | ICD-10-CM | POA: Diagnosis not present

## 2018-03-01 DIAGNOSIS — F0150 Vascular dementia without behavioral disturbance: Secondary | ICD-10-CM | POA: Diagnosis not present

## 2018-03-10 DIAGNOSIS — C61 Malignant neoplasm of prostate: Secondary | ICD-10-CM | POA: Diagnosis not present

## 2018-03-10 DIAGNOSIS — E119 Type 2 diabetes mellitus without complications: Secondary | ICD-10-CM | POA: Diagnosis not present

## 2018-04-18 DIAGNOSIS — H34812 Central retinal vein occlusion, left eye, with macular edema: Secondary | ICD-10-CM | POA: Diagnosis not present

## 2018-04-18 DIAGNOSIS — E119 Type 2 diabetes mellitus without complications: Secondary | ICD-10-CM | POA: Diagnosis not present

## 2018-05-04 DIAGNOSIS — I471 Supraventricular tachycardia: Secondary | ICD-10-CM | POA: Diagnosis not present

## 2018-05-04 DIAGNOSIS — F39 Unspecified mood [affective] disorder: Secondary | ICD-10-CM | POA: Diagnosis not present

## 2018-05-04 DIAGNOSIS — E119 Type 2 diabetes mellitus without complications: Secondary | ICD-10-CM | POA: Diagnosis not present

## 2018-05-04 DIAGNOSIS — F0150 Vascular dementia without behavioral disturbance: Secondary | ICD-10-CM | POA: Diagnosis not present

## 2018-05-04 DIAGNOSIS — Z8546 Personal history of malignant neoplasm of prostate: Secondary | ICD-10-CM | POA: Diagnosis not present

## 2018-06-21 DIAGNOSIS — H34812 Central retinal vein occlusion, left eye, with macular edema: Secondary | ICD-10-CM | POA: Diagnosis not present

## 2018-07-05 DIAGNOSIS — I471 Supraventricular tachycardia: Secondary | ICD-10-CM | POA: Diagnosis not present

## 2018-07-05 DIAGNOSIS — F39 Unspecified mood [affective] disorder: Secondary | ICD-10-CM | POA: Diagnosis not present

## 2018-07-05 DIAGNOSIS — E119 Type 2 diabetes mellitus without complications: Secondary | ICD-10-CM | POA: Diagnosis not present

## 2018-07-05 DIAGNOSIS — F0150 Vascular dementia without behavioral disturbance: Secondary | ICD-10-CM | POA: Diagnosis not present

## 2018-07-10 ENCOUNTER — Encounter: Payer: Self-pay | Admitting: Emergency Medicine

## 2018-07-10 ENCOUNTER — Other Ambulatory Visit: Payer: Self-pay

## 2018-07-10 ENCOUNTER — Emergency Department
Admission: EM | Admit: 2018-07-10 | Discharge: 2018-07-11 | Disposition: A | Discharge status: Home / Self Care | Payer: Medicare Other | Attending: Emergency Medicine | Admitting: Emergency Medicine

## 2018-07-10 ENCOUNTER — Emergency Department: Payer: Medicare Other

## 2018-07-10 DIAGNOSIS — Z7982 Long term (current) use of aspirin: Secondary | ICD-10-CM | POA: Insufficient documentation

## 2018-07-10 DIAGNOSIS — S0101XA Laceration without foreign body of scalp, initial encounter: Secondary | ICD-10-CM | POA: Diagnosis present

## 2018-07-10 DIAGNOSIS — W19XXXA Unspecified fall, initial encounter: Secondary | ICD-10-CM | POA: Diagnosis not present

## 2018-07-10 DIAGNOSIS — S060X1A Concussion with loss of consciousness of 30 minutes or less, initial encounter: Secondary | ICD-10-CM

## 2018-07-10 DIAGNOSIS — S02119A Unspecified fracture of occiput, initial encounter for closed fracture: Secondary | ICD-10-CM | POA: Diagnosis not present

## 2018-07-10 DIAGNOSIS — I10 Essential (primary) hypertension: Secondary | ICD-10-CM | POA: Diagnosis not present

## 2018-07-10 DIAGNOSIS — Z79899 Other long term (current) drug therapy: Secondary | ICD-10-CM | POA: Diagnosis not present

## 2018-07-10 DIAGNOSIS — R55 Syncope and collapse: Secondary | ICD-10-CM | POA: Diagnosis not present

## 2018-07-10 DIAGNOSIS — F0390 Unspecified dementia without behavioral disturbance: Secondary | ICD-10-CM | POA: Diagnosis not present

## 2018-07-10 DIAGNOSIS — Y999 Unspecified external cause status: Secondary | ICD-10-CM | POA: Diagnosis not present

## 2018-07-10 DIAGNOSIS — R1111 Vomiting without nausea: Secondary | ICD-10-CM | POA: Diagnosis not present

## 2018-07-10 DIAGNOSIS — E119 Type 2 diabetes mellitus without complications: Secondary | ICD-10-CM | POA: Insufficient documentation

## 2018-07-10 DIAGNOSIS — S069X9A Unspecified intracranial injury with loss of consciousness of unspecified duration, initial encounter: Secondary | ICD-10-CM | POA: Diagnosis not present

## 2018-07-10 DIAGNOSIS — Y939 Activity, unspecified: Secondary | ICD-10-CM | POA: Diagnosis not present

## 2018-07-10 DIAGNOSIS — Z9181 History of falling: Secondary | ICD-10-CM | POA: Diagnosis not present

## 2018-07-10 DIAGNOSIS — Y92129 Unspecified place in nursing home as the place of occurrence of the external cause: Secondary | ICD-10-CM | POA: Diagnosis not present

## 2018-07-10 DIAGNOSIS — S0211AA Type I occipital condyle fracture, right side, initial encounter for closed fracture: Secondary | ICD-10-CM | POA: Diagnosis not present

## 2018-07-10 DIAGNOSIS — M6281 Muscle weakness (generalized): Secondary | ICD-10-CM | POA: Diagnosis not present

## 2018-07-10 HISTORY — DX: Unspecified atrial fibrillation: I48.91

## 2018-07-10 HISTORY — DX: Type 2 diabetes mellitus without complications: E11.9

## 2018-07-10 LAB — CBC WITH DIFFERENTIAL/PLATELET
BASOS ABS: 0 10*3/uL (ref 0–0.1)
Basophils Relative: 0 %
EOS PCT: 0 %
Eosinophils Absolute: 0 10*3/uL (ref 0–0.7)
HEMATOCRIT: 39.3 % — AB (ref 40.0–52.0)
Hemoglobin: 13.3 g/dL (ref 13.0–18.0)
LYMPHS PCT: 19 %
Lymphs Abs: 1.3 10*3/uL (ref 1.0–3.6)
MCH: 29.6 pg (ref 26.0–34.0)
MCHC: 33.8 g/dL (ref 32.0–36.0)
MCV: 87.6 fL (ref 80.0–100.0)
Monocytes Absolute: 0.4 10*3/uL (ref 0.2–1.0)
Monocytes Relative: 6 %
Neutro Abs: 5.3 10*3/uL (ref 1.4–6.5)
Neutrophils Relative %: 75 %
Platelets: 217 10*3/uL (ref 150–440)
RBC: 4.48 MIL/uL (ref 4.40–5.90)
RDW: 14.7 % — ABNORMAL HIGH (ref 11.5–14.5)
WBC: 7.1 10*3/uL (ref 3.8–10.6)

## 2018-07-10 LAB — BASIC METABOLIC PANEL
Anion gap: 9 (ref 5–15)
BUN: 14 mg/dL (ref 8–23)
CALCIUM: 8.8 mg/dL — AB (ref 8.9–10.3)
CHLORIDE: 108 mmol/L (ref 98–111)
CO2: 24 mmol/L (ref 22–32)
Creatinine, Ser: 0.68 mg/dL (ref 0.61–1.24)
GFR calc non Af Amer: >60 mL/min (ref >60)
Glucose, Bld: 192 mg/dL — ABNORMAL HIGH (ref 70–99)
POTASSIUM: 3.2 mmol/L — AB (ref 3.5–5.1)
Sodium: 141 mmol/L (ref 135–145)

## 2018-07-10 MED ORDER — ONDANSETRON 4 MG PO TBDP: 4.0000 mg | ORAL_TABLET | Freq: Three times a day (TID) | ORAL | 0 refills | Status: DC | PRN

## 2018-07-10 MED ORDER — CEPHALEXIN 500 MG PO CAPS: 500.0000 mg | ORAL_CAPSULE | Freq: Three times a day (TID) | ORAL | 0 refills | Status: DC

## 2018-07-10 NOTE — ED Provider Notes (Signed)
[facility] Emergency Department Provider Note  ____________________________________________  Time seen: Approximately 11:27 PM  I have reviewed the triage vital signs and the nursing notes.   HISTORY  Chief Complaint Fall  Level 5 Caveat: Portions of the History and Physical including HPI and review of systems are unable to be completely obtained due to patient being a poor historian    HPI Hector Lucero is a 84 y.o. male with a history of atrial fibrillation diabetes SVT hypertension and dementia who was sent to the ED due to of unwitnessed fall at [facility].  He reportedly had some loss of consciousness briefly, and he had one episode of vomiting afterward.  He was in his usual state of health.  Patient denies any acute symptoms.  Denies headache vision change neck pain paresthesias weakness chest pain shortness of breath abdominal pain.      Past Medical History:  Diagnosis Date  . Atrial fibrillation ([place])   . Diabetes mellitus without complication ([place])   . Fatigue   . HLD (hyperlipidemia)   . Hypertension   . OSA (obstructive sleep apnea)   . Radial fracture 2006   right, screws and plates  . SVT (supraventricular tachycardia) ([place]) 2012   a. Holter in 2012 showed 27 episodes of SVT/atrial tach w/ longest being 36 beats; b. asymptomatic  . Syncope 08/2015   a. short rhythm strip in [facility] ED showed junctional escape rhythm and AV dissociation with essentially sinus bradycardia  . Ulnar fracture 1995   fall off of ladder     Patient Active Problem List   Diagnosis Date Noted  . Chest wall pain 03/12/2017  . Advance directive discussed with patient 01/02/2016  . SVT (supraventricular tachycardia) ([place]) 01/15/2015  . Vascular dementia without behavioral disturbance 09/16/2014  . Cancer of prostate w/low recurrence risk (T1-2a, Gleason<7 & PSA<10) ([place]) 03/09/2014  . Routine general medical examination at a health care facility 06/23/2013  .  Diabetes mellitus type 2, diet-controlled ([place]) 12/24/2011  . Hyperlipidemia with target LDL less than 70 12/24/2011     Past Surgical History:  Procedure Laterality Date  . HERNIA REPAIR  Aug 2012   right indirect inguinal, with mesh     Prior to Admission medications   Medication Sig Start Date End Date Taking? Authorizing Provider  CARTIA XT 120 MG 24 hr capsule TAKE 1 CAPSULE DAILY 01/12/17  Yes Gollan, Kathlene November, MD  Multiple Vitamins-Minerals (MENS 50+ MULTI VITAMIN/MIN PO) Take 1 tablet by mouth daily.   Yes [provider]  sertraline (ZOLOFT) 25 MG tablet Take 25 mg by mouth daily.   Yes [provider]  vitamin B-12 (CYANOCOBALAMIN) 1000 MCG tablet Take 1,000 mcg by mouth daily.   Yes [provider]  aspirin EC 81 MG tablet Take 1 tablet (81 mg total) by mouth every other day. Patient not taking: Reported on 07/10/2018 07/07/17   Venia Carbon, MD  calcium-vitamin D (OSCAL WITH D) 500-200 MG-UNIT tablet Take 1 tablet by mouth daily with breakfast.    [provider]  cephALEXin (KEFLEX) 500 MG capsule Take 1 capsule (500 mg total) by mouth 3 (three) times daily. 07/10/18   Carrie Mew, MD  cholecalciferol (VITAMIN D) 1000 units tablet Take 1,000 Units by mouth daily.    [provider]  fish oil-omega-3 fatty acids 1000 MG capsule Take 1,000 mg by mouth daily.      [provider]  ondansetron (ZOFRAN ODT) 4 MG disintegrating tablet  Take 1 tablet (4 mg total) by mouth every 8 (eight) hours as needed for nausea or vomiting. 07/10/18   Carrie Mew, MD     Allergies Patient has no known allergies.   Family History  Problem Relation Age of Onset  . Diabetes Mother     Social History Social History   Tobacco Use  . Smoking status: Never Smoker  . Smokeless tobacco: Never Used  Substance Use Topics  . Alcohol use: Yes    Alcohol/week: 3.0 standard drinks    Types: 3 Glasses of wine per week    Comment:  occasional  . Drug use: No    Review of Systems  Constitutional:   No fever or chills.  ENT:   No sore throat. No rhinorrhea. Cardiovascular:   No chest pain or syncope. Respiratory:   No dyspnea or cough. Gastrointestinal:   Negative for abdominal pain, vomiting and diarrhea.  Musculoskeletal:   Negative for focal pain or swelling All other systems reviewed and are negative except as documented above in ROS and HPI.  ____________________________________________   PHYSICAL EXAM:  VITAL SIGNS: ED Triage Vitals  Enc Vitals Group     BP 07/10/18 2121 128/79     Pulse Rate 07/10/18 2121 80     Resp 07/10/18 2121 11     Temp 07/10/18 2121 97.7 F (36.5 C)     Temp Source 07/10/18 2121 Oral     SpO2 07/10/18 2121 95 %     Weight 07/10/18 2114 168 lb (76.2 kg)     Height 07/10/18 2114 5\' 10"  (1.778 m)     Head Circumference --      Peak Flow --      Pain Score --      Pain Loc --      Pain Edu? --      Excl. in [place]? --     Vital signs reviewed, nursing assessments reviewed.   Constitutional:   Alert and oriented. Non-toxic appearance. Eyes:   Conjunctivae are normal. EOMI. PERRL. ENT      Head:   Normocephalic with 2 cm linear laceration over the occiput.  Hemostatic.      Nose:   No congestion/rhinnorhea.       Mouth/Throat:   MMM, no pharyngeal erythema. No peritonsillar mass.       Neck:   No meningismus. Full ROM.  No midline spinal tenderness Hematological/Lymphatic/Immunilogical:   No cervical lymphadenopathy. Cardiovascular:   RRR. Symmetric bilateral radial and DP pulses.  No murmurs. Cap refill less than 2 seconds. Respiratory:   Normal respiratory effort without tachypnea/retractions. Breath sounds are clear and equal bilaterally. No wheezes/rales/rhonchi. Gastrointestinal:   Soft and nontender. Non distended. There is no CVA tenderness.  No rebound, rigidity, or guarding. Musculoskeletal:   Normal range of motion in all extremities. No joint effusions.  No  lower extremity tenderness.  No edema. Neurologic:   Normal speech and language.  Follows commands, responds to questions appropriately Motor grossly intact. No acute focal neurologic deficits are appreciated.  Skin:    Skin is warm, dry and intact. No rash noted.  No petechiae, purpura, or bullae.  ____________________________________________    LABS (pertinent positives/negatives) (all labs ordered are listed, but only abnormal results are displayed) Labs Reviewed  CBC WITH DIFFERENTIAL/PLATELET - Abnormal; Notable for the following components:      Result Value   HCT 39.3 (*)    RDW 14.7 (*)    All other components within normal limits  BASIC METABOLIC PANEL - Abnormal; Notable for the following components:   Potassium 3.2 (*)    Glucose, Bld 192 (*)    Calcium 8.8 (*)    All other components within normal limits  URINALYSIS, COMPLETE (UACMP) WITH MICROSCOPIC   ____________________________________________   EKG  Interpreted by me Sinus rhythm rate of 79, normal axis and intervals.  Normal QRS ST segments and T waves.  ____________________________________________    RADIOLOGY  Ct Head Wo Contrast  Result Date: 07/10/2018 CLINICAL DATA:  84 year old male with fall and trauma to the head. EXAM: CT HEAD WITHOUT CONTRAST CT CERVICAL SPINE WITHOUT CONTRAST TECHNIQUE: Multidetector CT imaging of the head and cervical spine was performed following the standard protocol without intravenous contrast. Multiplanar CT image reconstructions of the cervical spine were also generated. COMPARISON:  Head CT dated 08/31/2015 FINDINGS: CT HEAD FINDINGS Brain: The ventricles and sulci appropriate size for patient's age. Mild periventricular and deep white matter chronic microvascular ischemic changes noted. There is no acute intracranial hemorrhage. No mass effect or midline shift. No extra-axial fluid collection. Vascular: No hyperdense vessel or unexpected calcification. Skull: There is a  nondisplaced fracture of the occipital calvarium extending to the left side of the foramen magnum. Sinuses/Orbits: There is with opacification of the left mastoid air cells. There is mild mucoperiosteal thickening of the paranasal sinuses. No air-fluid level. The right mastoid air cells are clear. Other: Mild posterior scalp contusion. CT CERVICAL SPINE FINDINGS Alignment: No acute subluxation. There is grade 1 C7-T1 anterolisthesis. Skull base and vertebrae: No acute fracture.  Osteopenia. Soft tissues and spinal canal: No prevertebral fluid or swelling. No visible canal hematoma. Disc levels: Multilevel degenerative changes primarily at C5-C6 and C6-C7 with loss of disc space and endplate irregularity. Upper chest: Negative. Other: Bilateral carotid bulb calcified plaques. IMPRESSION: 1. Nondisplaced fracture of the occipital calvarium. No acute intracranial hemorrhage. 2. No acute/traumatic cervical spine pathology. 3. Left mastoid effusions. These results were called by telephone at the time of interpretation on 07/10/2018 at 10:00 pm to Dr. Carrie Mew , who verbally acknowledged these results. Electronically Signed   By: Anner Crete M.D.   On: 07/10/2018 22:04   Ct Cervical Spine Wo Contrast  Result Date: 07/10/2018 CLINICAL DATA:  84 year old male with fall and trauma to the head. EXAM: CT HEAD WITHOUT CONTRAST CT CERVICAL SPINE WITHOUT CONTRAST TECHNIQUE: Multidetector CT imaging of the head and cervical spine was performed following the standard protocol without intravenous contrast. Multiplanar CT image reconstructions of the cervical spine were also generated. COMPARISON:  Head CT dated 08/31/2015 FINDINGS: CT HEAD FINDINGS Brain: The ventricles and sulci appropriate size for patient's age. Mild periventricular and deep white matter chronic microvascular ischemic changes noted. There is no acute intracranial hemorrhage. No mass effect or midline shift. No extra-axial fluid collection. Vascular:  No hyperdense vessel or unexpected calcification. Skull: There is a nondisplaced fracture of the occipital calvarium extending to the left side of the foramen magnum. Sinuses/Orbits: There is with opacification of the left mastoid air cells. There is mild mucoperiosteal thickening of the paranasal sinuses. No air-fluid level. The right mastoid air cells are clear. Other: Mild posterior scalp contusion. CT CERVICAL SPINE FINDINGS Alignment: No acute subluxation. There is grade 1 C7-T1 anterolisthesis. Skull base and vertebrae: No acute fracture.  Osteopenia. Soft tissues and spinal canal: No prevertebral fluid or swelling. No visible canal hematoma. Disc levels: Multilevel degenerative changes primarily at C5-C6 and C6-C7 with loss of disc space and endplate irregularity. Upper  chest: Negative. Other: Bilateral carotid bulb calcified plaques. IMPRESSION: 1. Nondisplaced fracture of the occipital calvarium. No acute intracranial hemorrhage. 2. No acute/traumatic cervical spine pathology. 3. Left mastoid effusions. These results were called by telephone at the time of interpretation on 07/10/2018 at 10:00 pm to Dr. Carrie Mew , who verbally acknowledged these results. Electronically Signed   By: Anner Crete M.D.   On: 07/10/2018 22:04    ____________________________________________   PROCEDURES .Marland KitchenLaceration Repair Date/Time: 07/10/2018 11:31 PM Performed by: Carrie Mew, MD Authorized by: Carrie Mew, MD   Consent:    Consent obtained:  Verbal   Consent given by:  Patient   Risks discussed:  Infection, pain, retained foreign body, poor cosmetic result and poor wound healing   Alternatives discussed:  No treatment Anesthesia (see MAR for exact dosages):    Anesthesia method:  None Laceration details:    Location:  Scalp   Scalp location:  Occipital   Length (cm):  2 Repair type:    Repair type:  Simple Pre-procedure details:    Preparation:  Imaging obtained to evaluate for  foreign bodies Exploration:    Hemostasis achieved with:  Direct pressure   Wound exploration: entire depth of wound probed and visualized     Wound extent: no foreign bodies/material noted and no vascular damage noted     Contaminated: no   Treatment:    Area cleansed with:  Saline   Amount of cleaning:  Extensive   Irrigation solution:  Sterile saline   Visualized foreign bodies/material removed: no   Skin repair:    Repair method:  Staples   Number of staples:  2 Approximation:    Approximation:  Close Post-procedure details:    Dressing:  Sterile dressing   Patient tolerance of procedure:  Tolerated well, no immediate complications    ____________________________________________  DIFFERENTIAL DIAGNOSIS   Subdural hematoma, epidural hematoma, cervical spine fracture, skull fracture, urinary tract infection, dehydration  CLINICAL IMPRESSION / ASSESSMENT AND PLAN / ED COURSE  Pertinent labs & imaging results that were available during my care of the patient were reviewed by me and considered in my medical decision making (see chart for details).   Presents after an unwitnessed fall.  No acute symptoms.  Check labs urinalysis and CT head and neck.  EKG is unremarkable.   Clinical Course as of Jul 10 2326  Nancy Fetter Jul 10, 2018  2244 Lac repaired. 2 staples.    [PS]    Clinical Course User Index [PS] Carrie Mew, MD     ----------------------------------------- 11:34 PM on 07/10/2018 -----------------------------------------  CT head shows a linear skull fracture through the occiput.  No intracranial hemorrhage.  No C-spine fracture.  No specific treatment for this although we will start him on Keflex for prophylaxis due to the overlying skin injury.  Able to follow-up with primary care at this point.  No need to obtain urinalysis at this point since Keflex will treat UTI. ____________________________________________   FINAL CLINICAL IMPRESSION(S) / ED  DIAGNOSES    Final diagnoses:  Fall, initial encounter  Concussion with loss of consciousness of 30 minutes or less, initial encounter  Fracture of occipital bone of skull with loss of consciousness ([place])  Laceration of scalp, initial encounter     ED Discharge Orders         Ordered    cephALEXin (KEFLEX) 500 MG capsule  3 times daily     07/10/18 2327    ondansetron (ZOFRAN ODT) 4 MG disintegrating tablet  Every 8 hours PRN     07/10/18 2327          Portions of this note were generated with dragon dictation software. Dictation errors may occur despite best attempts at proofreading.    Carrie Mew, MD 07/10/18 [phone]

## 2018-07-10 NOTE — Discharge Instructions (Signed)
The cut on the back of your head was closed with 2 staples.  These should be removed in 1 week.  Your CT scan shows a nondisplaced fracture through the bony prominence at the back of the head.  No specific treatment is needed for this.  Take the antibiotic as prescribed to protect from infection.  Follow-up with your doctor this week for continued monitoring of your symptoms.  Your other labs were unremarkable.

## 2018-07-10 NOTE — ED Triage Notes (Signed)
Pt arrives via [facility] with c/o fall. Pt had an unwitnessed fall at [facility] where he lives with spouse. Pt has hx of dementia. Pt has laceration to back of head and started vomiting after fall.

## 2018-07-10 NOTE — ED Notes (Signed)
Per EMS, pt given 4 mg Zofran in route.

## 2018-07-11 LAB — URINALYSIS, COMPLETE (UACMP) WITH MICROSCOPIC
Bilirubin Urine: NEGATIVE
GLUCOSE, UA: NEGATIVE mg/dL
Hgb urine dipstick: NEGATIVE
KETONES UR: 5 mg/dL — AB
LEUKOCYTES UA: NEGATIVE
Nitrite: NEGATIVE
Protein, ur: NEGATIVE mg/dL
Specific Gravity, Urine: 1.01 (ref 1.005–1.030)
pH: 7 (ref 5.0–8.0)

## 2018-07-11 NOTE — ED Notes (Signed)
Ems left with pt.

## 2018-07-12 ENCOUNTER — Telehealth: Payer: Self-pay | Admitting: Internal Medicine

## 2018-07-12 DIAGNOSIS — I959 Hypotension, unspecified: Secondary | ICD-10-CM | POA: Diagnosis not present

## 2018-07-12 DIAGNOSIS — S060X0A Concussion without loss of consciousness, initial encounter: Secondary | ICD-10-CM | POA: Diagnosis not present

## 2018-07-12 DIAGNOSIS — S02113A Unspecified occipital condyle fracture, initial encounter for closed fracture: Secondary | ICD-10-CM | POA: Diagnosis not present

## 2018-07-12 NOTE — Telephone Encounter (Signed)
Copied from Bruce [id number]. Topic: Quick Communication - See Telephone Encounter >> Jul 12, 2018  4:22 PM Blase Mess A wrote: CRM for notification. See Telephone encounter for: 07/12/18. Hector Lucero nephew of Hector Lucero, is calling to speak to Dr. Silvio Pate on how the appointment went today with his uncle.  Mr. Hector Lucero is not on the FYI for Mr. Hector Lucero but says that he will fax the [place].  Hector Lucero call back number [phone]

## 2018-07-13 NOTE — Telephone Encounter (Signed)
Discussed the situation---- head trauma, apparent concussion, no focal neuro findings Will have PT work with him on his stability BP med stopped due to dizziness

## 2018-07-20 DIAGNOSIS — M6281 Muscle weakness (generalized): Secondary | ICD-10-CM | POA: Diagnosis not present

## 2018-07-20 DIAGNOSIS — R2681 Unsteadiness on feet: Secondary | ICD-10-CM | POA: Diagnosis not present

## 2018-07-21 DIAGNOSIS — R2681 Unsteadiness on feet: Secondary | ICD-10-CM | POA: Diagnosis not present

## 2018-07-21 DIAGNOSIS — M6281 Muscle weakness (generalized): Secondary | ICD-10-CM | POA: Diagnosis not present

## 2018-07-22 DIAGNOSIS — R2681 Unsteadiness on feet: Secondary | ICD-10-CM | POA: Diagnosis not present

## 2018-07-22 DIAGNOSIS — M6281 Muscle weakness (generalized): Secondary | ICD-10-CM | POA: Diagnosis not present

## 2018-07-25 DIAGNOSIS — R2681 Unsteadiness on feet: Secondary | ICD-10-CM | POA: Diagnosis not present

## 2018-07-25 DIAGNOSIS — M6281 Muscle weakness (generalized): Secondary | ICD-10-CM | POA: Diagnosis not present

## 2018-07-26 DIAGNOSIS — M6281 Muscle weakness (generalized): Secondary | ICD-10-CM | POA: Diagnosis not present

## 2018-07-26 DIAGNOSIS — R2681 Unsteadiness on feet: Secondary | ICD-10-CM | POA: Diagnosis not present

## 2018-07-27 DIAGNOSIS — M6281 Muscle weakness (generalized): Secondary | ICD-10-CM | POA: Diagnosis not present

## 2018-07-27 DIAGNOSIS — R2681 Unsteadiness on feet: Secondary | ICD-10-CM | POA: Diagnosis not present

## 2018-07-28 DIAGNOSIS — M6281 Muscle weakness (generalized): Secondary | ICD-10-CM | POA: Diagnosis not present

## 2018-07-28 DIAGNOSIS — R2681 Unsteadiness on feet: Secondary | ICD-10-CM | POA: Diagnosis not present

## 2018-07-29 DIAGNOSIS — R2681 Unsteadiness on feet: Secondary | ICD-10-CM | POA: Diagnosis not present

## 2018-07-29 DIAGNOSIS — M6281 Muscle weakness (generalized): Secondary | ICD-10-CM | POA: Diagnosis not present

## 2018-08-01 DIAGNOSIS — M6281 Muscle weakness (generalized): Secondary | ICD-10-CM | POA: Diagnosis not present

## 2018-08-01 DIAGNOSIS — R2681 Unsteadiness on feet: Secondary | ICD-10-CM | POA: Diagnosis not present

## 2018-08-01 DIAGNOSIS — H34812 Central retinal vein occlusion, left eye, with macular edema: Secondary | ICD-10-CM | POA: Diagnosis not present

## 2018-08-02 DIAGNOSIS — M6281 Muscle weakness (generalized): Secondary | ICD-10-CM | POA: Diagnosis not present

## 2018-08-02 DIAGNOSIS — R2681 Unsteadiness on feet: Secondary | ICD-10-CM | POA: Diagnosis not present

## 2018-08-03 DIAGNOSIS — R2681 Unsteadiness on feet: Secondary | ICD-10-CM | POA: Diagnosis not present

## 2018-08-03 DIAGNOSIS — M6281 Muscle weakness (generalized): Secondary | ICD-10-CM | POA: Diagnosis not present

## 2018-08-05 DIAGNOSIS — M6281 Muscle weakness (generalized): Secondary | ICD-10-CM | POA: Diagnosis not present

## 2018-08-05 DIAGNOSIS — R2681 Unsteadiness on feet: Secondary | ICD-10-CM | POA: Diagnosis not present

## 2018-08-08 DIAGNOSIS — M6281 Muscle weakness (generalized): Secondary | ICD-10-CM | POA: Diagnosis not present

## 2018-08-08 DIAGNOSIS — R2681 Unsteadiness on feet: Secondary | ICD-10-CM | POA: Diagnosis not present

## 2018-09-02 DIAGNOSIS — F0150 Vascular dementia without behavioral disturbance: Secondary | ICD-10-CM | POA: Diagnosis not present

## 2018-09-02 DIAGNOSIS — E1140 Type 2 diabetes mellitus with diabetic neuropathy, unspecified: Secondary | ICD-10-CM | POA: Diagnosis not present

## 2018-09-02 DIAGNOSIS — C61 Malignant neoplasm of prostate: Secondary | ICD-10-CM

## 2018-09-02 DIAGNOSIS — I471 Supraventricular tachycardia: Secondary | ICD-10-CM | POA: Diagnosis not present

## 2018-09-02 DIAGNOSIS — F39 Unspecified mood [affective] disorder: Secondary | ICD-10-CM | POA: Diagnosis not present

## 2018-09-12 DIAGNOSIS — H34812 Central retinal vein occlusion, left eye, with macular edema: Secondary | ICD-10-CM | POA: Diagnosis not present

## 2018-10-13 DIAGNOSIS — F0150 Vascular dementia without behavioral disturbance: Secondary | ICD-10-CM | POA: Diagnosis not present

## 2018-10-13 DIAGNOSIS — E1159 Type 2 diabetes mellitus with other circulatory complications: Secondary | ICD-10-CM | POA: Diagnosis not present

## 2018-10-13 DIAGNOSIS — I471 Supraventricular tachycardia: Secondary | ICD-10-CM | POA: Diagnosis not present

## 2018-10-13 DIAGNOSIS — F39 Unspecified mood [affective] disorder: Secondary | ICD-10-CM | POA: Diagnosis not present

## 2018-12-06 DIAGNOSIS — H34812 Central retinal vein occlusion, left eye, with macular edema: Secondary | ICD-10-CM | POA: Diagnosis not present

## 2019-01-04 DIAGNOSIS — F0150 Vascular dementia without behavioral disturbance: Secondary | ICD-10-CM | POA: Diagnosis not present

## 2019-01-04 DIAGNOSIS — E1140 Type 2 diabetes mellitus with diabetic neuropathy, unspecified: Secondary | ICD-10-CM | POA: Diagnosis not present

## 2019-01-04 DIAGNOSIS — I471 Supraventricular tachycardia: Secondary | ICD-10-CM | POA: Diagnosis not present

## 2019-01-04 DIAGNOSIS — F39 Unspecified mood [affective] disorder: Secondary | ICD-10-CM | POA: Diagnosis not present

## 2019-01-04 DIAGNOSIS — C637 Malignant neoplasm of other specified male genital organs: Secondary | ICD-10-CM | POA: Diagnosis not present

## 2019-01-17 DIAGNOSIS — H34812 Central retinal vein occlusion, left eye, with macular edema: Secondary | ICD-10-CM | POA: Diagnosis not present

## 2019-02-16 DIAGNOSIS — I471 Supraventricular tachycardia: Secondary | ICD-10-CM | POA: Diagnosis not present

## 2019-02-16 DIAGNOSIS — E1159 Type 2 diabetes mellitus with other circulatory complications: Secondary | ICD-10-CM | POA: Diagnosis not present

## 2019-02-16 DIAGNOSIS — C61 Malignant neoplasm of prostate: Secondary | ICD-10-CM | POA: Diagnosis not present

## 2019-02-16 DIAGNOSIS — F0150 Vascular dementia without behavioral disturbance: Secondary | ICD-10-CM | POA: Diagnosis not present

## 2019-03-13 DIAGNOSIS — E039 Hypothyroidism, unspecified: Secondary | ICD-10-CM | POA: Diagnosis not present

## 2019-03-13 DIAGNOSIS — C61 Malignant neoplasm of prostate: Secondary | ICD-10-CM | POA: Diagnosis not present

## 2019-03-13 DIAGNOSIS — E119 Type 2 diabetes mellitus without complications: Secondary | ICD-10-CM | POA: Diagnosis not present

## 2019-03-21 DIAGNOSIS — H34812 Central retinal vein occlusion, left eye, with macular edema: Secondary | ICD-10-CM | POA: Diagnosis not present

## 2019-03-30 ENCOUNTER — Telehealth: Payer: Self-pay

## 2019-03-30 NOTE — Telephone Encounter (Signed)
Called patient from recall list.  Number was disconnected.  This is the 3rd attempt per recall list.  Will delete recall.

## 2019-04-28 DIAGNOSIS — E11610 Type 2 diabetes mellitus with diabetic neuropathic arthropathy: Secondary | ICD-10-CM | POA: Diagnosis not present

## 2019-04-28 DIAGNOSIS — I471 Supraventricular tachycardia: Secondary | ICD-10-CM | POA: Diagnosis not present

## 2019-04-28 DIAGNOSIS — F0150 Vascular dementia without behavioral disturbance: Secondary | ICD-10-CM | POA: Diagnosis not present

## 2019-05-09 DIAGNOSIS — H34812 Central retinal vein occlusion, left eye, with macular edema: Secondary | ICD-10-CM | POA: Diagnosis not present

## 2019-06-06 DIAGNOSIS — B342 Coronavirus infection, unspecified: Secondary | ICD-10-CM | POA: Diagnosis not present

## 2019-07-04 DIAGNOSIS — I471 Supraventricular tachycardia: Secondary | ICD-10-CM

## 2019-07-04 DIAGNOSIS — F0150 Vascular dementia without behavioral disturbance: Secondary | ICD-10-CM

## 2019-07-04 DIAGNOSIS — E1159 Type 2 diabetes mellitus with other circulatory complications: Secondary | ICD-10-CM

## 2019-07-04 DIAGNOSIS — C61 Malignant neoplasm of prostate: Secondary | ICD-10-CM

## 2019-07-14 DIAGNOSIS — Z03818 Encounter for observation for suspected exposure to other biological agents ruled out: Secondary | ICD-10-CM | POA: Diagnosis not present

## 2019-07-19 DIAGNOSIS — Z03818 Encounter for observation for suspected exposure to other biological agents ruled out: Secondary | ICD-10-CM | POA: Diagnosis not present

## 2019-08-07 DIAGNOSIS — H34812 Central retinal vein occlusion, left eye, with macular edema: Secondary | ICD-10-CM | POA: Diagnosis not present

## 2019-08-11 DIAGNOSIS — Z03818 Encounter for observation for suspected exposure to other biological agents ruled out: Secondary | ICD-10-CM | POA: Diagnosis not present

## 2019-08-16 DIAGNOSIS — Z03818 Encounter for observation for suspected exposure to other biological agents ruled out: Secondary | ICD-10-CM | POA: Diagnosis not present

## 2019-08-22 DIAGNOSIS — Z03818 Encounter for observation for suspected exposure to other biological agents ruled out: Secondary | ICD-10-CM | POA: Diagnosis not present

## 2019-08-25 DIAGNOSIS — Z03818 Encounter for observation for suspected exposure to other biological agents ruled out: Secondary | ICD-10-CM | POA: Diagnosis not present

## 2019-08-29 DIAGNOSIS — Z03818 Encounter for observation for suspected exposure to other biological agents ruled out: Secondary | ICD-10-CM | POA: Diagnosis not present

## 2019-08-29 NOTE — Telephone Encounter (Signed)
error 

## 2019-09-06 DIAGNOSIS — F0150 Vascular dementia without behavioral disturbance: Secondary | ICD-10-CM | POA: Diagnosis not present

## 2019-09-06 DIAGNOSIS — E1159 Type 2 diabetes mellitus with other circulatory complications: Secondary | ICD-10-CM | POA: Diagnosis not present

## 2019-09-06 DIAGNOSIS — C61 Malignant neoplasm of prostate: Secondary | ICD-10-CM | POA: Diagnosis not present

## 2019-09-06 DIAGNOSIS — I471 Supraventricular tachycardia: Secondary | ICD-10-CM | POA: Diagnosis not present

## 2019-09-08 DIAGNOSIS — Z03818 Encounter for observation for suspected exposure to other biological agents ruled out: Secondary | ICD-10-CM | POA: Diagnosis not present

## 2019-10-03 DIAGNOSIS — H34812 Central retinal vein occlusion, left eye, with macular edema: Secondary | ICD-10-CM | POA: Diagnosis not present

## 2019-10-03 LAB — HM DIABETES EYE EXAM

## 2019-10-30 DIAGNOSIS — E1159 Type 2 diabetes mellitus with other circulatory complications: Secondary | ICD-10-CM | POA: Diagnosis not present

## 2019-10-30 DIAGNOSIS — F0150 Vascular dementia without behavioral disturbance: Secondary | ICD-10-CM | POA: Diagnosis not present

## 2019-10-30 DIAGNOSIS — I471 Supraventricular tachycardia: Secondary | ICD-10-CM

## 2019-10-30 DIAGNOSIS — C61 Malignant neoplasm of prostate: Secondary | ICD-10-CM

## 2019-11-20 DIAGNOSIS — Z23 Encounter for immunization: Secondary | ICD-10-CM | POA: Diagnosis not present

## 2019-11-20 DIAGNOSIS — H34812 Central retinal vein occlusion, left eye, with macular edema: Secondary | ICD-10-CM | POA: Diagnosis not present

## 2019-11-23 DIAGNOSIS — M7541 Impingement syndrome of right shoulder: Secondary | ICD-10-CM | POA: Diagnosis not present

## 2019-11-24 DIAGNOSIS — I471 Supraventricular tachycardia: Secondary | ICD-10-CM | POA: Diagnosis not present

## 2019-11-24 DIAGNOSIS — F0150 Vascular dementia without behavioral disturbance: Secondary | ICD-10-CM | POA: Diagnosis not present

## 2019-11-24 DIAGNOSIS — M25511 Pain in right shoulder: Secondary | ICD-10-CM | POA: Diagnosis not present

## 2019-11-24 DIAGNOSIS — Z8546 Personal history of malignant neoplasm of prostate: Secondary | ICD-10-CM | POA: Diagnosis not present

## 2019-11-24 DIAGNOSIS — I82409 Acute embolism and thrombosis of unspecified deep veins of unspecified lower extremity: Secondary | ICD-10-CM | POA: Diagnosis not present

## 2019-11-24 DIAGNOSIS — M6281 Muscle weakness (generalized): Secondary | ICD-10-CM | POA: Diagnosis not present

## 2019-11-27 DIAGNOSIS — M6281 Muscle weakness (generalized): Secondary | ICD-10-CM | POA: Diagnosis not present

## 2019-11-27 DIAGNOSIS — Z8546 Personal history of malignant neoplasm of prostate: Secondary | ICD-10-CM | POA: Diagnosis not present

## 2019-11-27 DIAGNOSIS — F0150 Vascular dementia without behavioral disturbance: Secondary | ICD-10-CM | POA: Diagnosis not present

## 2019-11-27 DIAGNOSIS — I82409 Acute embolism and thrombosis of unspecified deep veins of unspecified lower extremity: Secondary | ICD-10-CM | POA: Diagnosis not present

## 2019-11-27 DIAGNOSIS — M25511 Pain in right shoulder: Secondary | ICD-10-CM | POA: Diagnosis not present

## 2019-11-27 DIAGNOSIS — I471 Supraventricular tachycardia: Secondary | ICD-10-CM | POA: Diagnosis not present

## 2019-11-29 DIAGNOSIS — I82409 Acute embolism and thrombosis of unspecified deep veins of unspecified lower extremity: Secondary | ICD-10-CM | POA: Diagnosis not present

## 2019-11-29 DIAGNOSIS — F0150 Vascular dementia without behavioral disturbance: Secondary | ICD-10-CM | POA: Diagnosis not present

## 2019-11-29 DIAGNOSIS — I471 Supraventricular tachycardia: Secondary | ICD-10-CM | POA: Diagnosis not present

## 2019-11-29 DIAGNOSIS — M6281 Muscle weakness (generalized): Secondary | ICD-10-CM | POA: Diagnosis not present

## 2019-11-29 DIAGNOSIS — Z8546 Personal history of malignant neoplasm of prostate: Secondary | ICD-10-CM | POA: Diagnosis not present

## 2019-11-29 DIAGNOSIS — M25511 Pain in right shoulder: Secondary | ICD-10-CM | POA: Diagnosis not present

## 2019-12-01 DIAGNOSIS — F0150 Vascular dementia without behavioral disturbance: Secondary | ICD-10-CM | POA: Diagnosis not present

## 2019-12-01 DIAGNOSIS — M6281 Muscle weakness (generalized): Secondary | ICD-10-CM | POA: Diagnosis not present

## 2019-12-01 DIAGNOSIS — M25511 Pain in right shoulder: Secondary | ICD-10-CM | POA: Diagnosis not present

## 2019-12-01 DIAGNOSIS — I82409 Acute embolism and thrombosis of unspecified deep veins of unspecified lower extremity: Secondary | ICD-10-CM | POA: Diagnosis not present

## 2019-12-01 DIAGNOSIS — I471 Supraventricular tachycardia: Secondary | ICD-10-CM | POA: Diagnosis not present

## 2019-12-01 DIAGNOSIS — Z8546 Personal history of malignant neoplasm of prostate: Secondary | ICD-10-CM | POA: Diagnosis not present

## 2019-12-05 DIAGNOSIS — M6281 Muscle weakness (generalized): Secondary | ICD-10-CM | POA: Diagnosis not present

## 2019-12-05 DIAGNOSIS — I82409 Acute embolism and thrombosis of unspecified deep veins of unspecified lower extremity: Secondary | ICD-10-CM | POA: Diagnosis not present

## 2019-12-05 DIAGNOSIS — F0150 Vascular dementia without behavioral disturbance: Secondary | ICD-10-CM | POA: Diagnosis not present

## 2019-12-05 DIAGNOSIS — I471 Supraventricular tachycardia: Secondary | ICD-10-CM | POA: Diagnosis not present

## 2019-12-05 DIAGNOSIS — M25511 Pain in right shoulder: Secondary | ICD-10-CM | POA: Diagnosis not present

## 2019-12-05 DIAGNOSIS — Z8546 Personal history of malignant neoplasm of prostate: Secondary | ICD-10-CM | POA: Diagnosis not present

## 2019-12-06 DIAGNOSIS — M6281 Muscle weakness (generalized): Secondary | ICD-10-CM | POA: Diagnosis not present

## 2019-12-06 DIAGNOSIS — M25511 Pain in right shoulder: Secondary | ICD-10-CM | POA: Diagnosis not present

## 2019-12-06 DIAGNOSIS — I82409 Acute embolism and thrombosis of unspecified deep veins of unspecified lower extremity: Secondary | ICD-10-CM | POA: Diagnosis not present

## 2019-12-06 DIAGNOSIS — I471 Supraventricular tachycardia: Secondary | ICD-10-CM | POA: Diagnosis not present

## 2019-12-06 DIAGNOSIS — F0150 Vascular dementia without behavioral disturbance: Secondary | ICD-10-CM | POA: Diagnosis not present

## 2019-12-06 DIAGNOSIS — Z8546 Personal history of malignant neoplasm of prostate: Secondary | ICD-10-CM | POA: Diagnosis not present

## 2019-12-08 DIAGNOSIS — M6281 Muscle weakness (generalized): Secondary | ICD-10-CM | POA: Diagnosis not present

## 2019-12-08 DIAGNOSIS — I471 Supraventricular tachycardia: Secondary | ICD-10-CM | POA: Diagnosis not present

## 2019-12-08 DIAGNOSIS — I82409 Acute embolism and thrombosis of unspecified deep veins of unspecified lower extremity: Secondary | ICD-10-CM | POA: Diagnosis not present

## 2019-12-08 DIAGNOSIS — Z8546 Personal history of malignant neoplasm of prostate: Secondary | ICD-10-CM | POA: Diagnosis not present

## 2019-12-08 DIAGNOSIS — M25511 Pain in right shoulder: Secondary | ICD-10-CM | POA: Diagnosis not present

## 2019-12-08 DIAGNOSIS — F0150 Vascular dementia without behavioral disturbance: Secondary | ICD-10-CM | POA: Diagnosis not present

## 2019-12-11 DIAGNOSIS — M25511 Pain in right shoulder: Secondary | ICD-10-CM | POA: Diagnosis not present

## 2019-12-11 DIAGNOSIS — F0150 Vascular dementia without behavioral disturbance: Secondary | ICD-10-CM | POA: Diagnosis not present

## 2019-12-11 DIAGNOSIS — I471 Supraventricular tachycardia: Secondary | ICD-10-CM | POA: Diagnosis not present

## 2019-12-11 DIAGNOSIS — Z8546 Personal history of malignant neoplasm of prostate: Secondary | ICD-10-CM | POA: Diagnosis not present

## 2019-12-11 DIAGNOSIS — I82409 Acute embolism and thrombosis of unspecified deep veins of unspecified lower extremity: Secondary | ICD-10-CM | POA: Diagnosis not present

## 2019-12-11 DIAGNOSIS — M6281 Muscle weakness (generalized): Secondary | ICD-10-CM | POA: Diagnosis not present

## 2019-12-13 DIAGNOSIS — M25511 Pain in right shoulder: Secondary | ICD-10-CM | POA: Diagnosis not present

## 2019-12-13 DIAGNOSIS — F0150 Vascular dementia without behavioral disturbance: Secondary | ICD-10-CM | POA: Diagnosis not present

## 2019-12-13 DIAGNOSIS — I82409 Acute embolism and thrombosis of unspecified deep veins of unspecified lower extremity: Secondary | ICD-10-CM | POA: Diagnosis not present

## 2019-12-13 DIAGNOSIS — I471 Supraventricular tachycardia: Secondary | ICD-10-CM | POA: Diagnosis not present

## 2019-12-13 DIAGNOSIS — Z8546 Personal history of malignant neoplasm of prostate: Secondary | ICD-10-CM | POA: Diagnosis not present

## 2019-12-13 DIAGNOSIS — M6281 Muscle weakness (generalized): Secondary | ICD-10-CM | POA: Diagnosis not present

## 2019-12-15 DIAGNOSIS — I82409 Acute embolism and thrombosis of unspecified deep veins of unspecified lower extremity: Secondary | ICD-10-CM | POA: Diagnosis not present

## 2019-12-15 DIAGNOSIS — M6281 Muscle weakness (generalized): Secondary | ICD-10-CM | POA: Diagnosis not present

## 2019-12-15 DIAGNOSIS — I471 Supraventricular tachycardia: Secondary | ICD-10-CM | POA: Diagnosis not present

## 2019-12-15 DIAGNOSIS — M25511 Pain in right shoulder: Secondary | ICD-10-CM | POA: Diagnosis not present

## 2019-12-15 DIAGNOSIS — F0150 Vascular dementia without behavioral disturbance: Secondary | ICD-10-CM | POA: Diagnosis not present

## 2019-12-15 DIAGNOSIS — Z8546 Personal history of malignant neoplasm of prostate: Secondary | ICD-10-CM | POA: Diagnosis not present

## 2019-12-18 DIAGNOSIS — I82409 Acute embolism and thrombosis of unspecified deep veins of unspecified lower extremity: Secondary | ICD-10-CM | POA: Diagnosis not present

## 2019-12-18 DIAGNOSIS — F0150 Vascular dementia without behavioral disturbance: Secondary | ICD-10-CM | POA: Diagnosis not present

## 2019-12-18 DIAGNOSIS — Z8546 Personal history of malignant neoplasm of prostate: Secondary | ICD-10-CM | POA: Diagnosis not present

## 2019-12-18 DIAGNOSIS — I471 Supraventricular tachycardia: Secondary | ICD-10-CM | POA: Diagnosis not present

## 2019-12-18 DIAGNOSIS — M6281 Muscle weakness (generalized): Secondary | ICD-10-CM | POA: Diagnosis not present

## 2019-12-18 DIAGNOSIS — M25511 Pain in right shoulder: Secondary | ICD-10-CM | POA: Diagnosis not present

## 2019-12-20 DIAGNOSIS — M6281 Muscle weakness (generalized): Secondary | ICD-10-CM | POA: Diagnosis not present

## 2019-12-20 DIAGNOSIS — I82409 Acute embolism and thrombosis of unspecified deep veins of unspecified lower extremity: Secondary | ICD-10-CM | POA: Diagnosis not present

## 2019-12-20 DIAGNOSIS — F0150 Vascular dementia without behavioral disturbance: Secondary | ICD-10-CM | POA: Diagnosis not present

## 2019-12-20 DIAGNOSIS — M25511 Pain in right shoulder: Secondary | ICD-10-CM | POA: Diagnosis not present

## 2019-12-20 DIAGNOSIS — Z8546 Personal history of malignant neoplasm of prostate: Secondary | ICD-10-CM | POA: Diagnosis not present

## 2019-12-20 DIAGNOSIS — I471 Supraventricular tachycardia: Secondary | ICD-10-CM | POA: Diagnosis not present

## 2019-12-22 DIAGNOSIS — I82409 Acute embolism and thrombosis of unspecified deep veins of unspecified lower extremity: Secondary | ICD-10-CM | POA: Diagnosis not present

## 2019-12-22 DIAGNOSIS — I471 Supraventricular tachycardia: Secondary | ICD-10-CM | POA: Diagnosis not present

## 2019-12-22 DIAGNOSIS — Z8546 Personal history of malignant neoplasm of prostate: Secondary | ICD-10-CM | POA: Diagnosis not present

## 2019-12-22 DIAGNOSIS — M25511 Pain in right shoulder: Secondary | ICD-10-CM | POA: Diagnosis not present

## 2019-12-22 DIAGNOSIS — M6281 Muscle weakness (generalized): Secondary | ICD-10-CM | POA: Diagnosis not present

## 2019-12-22 DIAGNOSIS — F0150 Vascular dementia without behavioral disturbance: Secondary | ICD-10-CM | POA: Diagnosis not present

## 2019-12-22 DIAGNOSIS — C61 Malignant neoplasm of prostate: Secondary | ICD-10-CM | POA: Diagnosis not present

## 2019-12-25 DIAGNOSIS — Z8546 Personal history of malignant neoplasm of prostate: Secondary | ICD-10-CM | POA: Diagnosis not present

## 2019-12-25 DIAGNOSIS — M25511 Pain in right shoulder: Secondary | ICD-10-CM | POA: Diagnosis not present

## 2019-12-25 DIAGNOSIS — M6281 Muscle weakness (generalized): Secondary | ICD-10-CM | POA: Diagnosis not present

## 2019-12-25 DIAGNOSIS — F0150 Vascular dementia without behavioral disturbance: Secondary | ICD-10-CM | POA: Diagnosis not present

## 2019-12-25 DIAGNOSIS — I471 Supraventricular tachycardia: Secondary | ICD-10-CM | POA: Diagnosis not present

## 2019-12-25 DIAGNOSIS — I82409 Acute embolism and thrombosis of unspecified deep veins of unspecified lower extremity: Secondary | ICD-10-CM | POA: Diagnosis not present

## 2019-12-28 DIAGNOSIS — F0150 Vascular dementia without behavioral disturbance: Secondary | ICD-10-CM | POA: Diagnosis not present

## 2019-12-28 DIAGNOSIS — I471 Supraventricular tachycardia: Secondary | ICD-10-CM | POA: Diagnosis not present

## 2019-12-28 DIAGNOSIS — M6281 Muscle weakness (generalized): Secondary | ICD-10-CM | POA: Diagnosis not present

## 2019-12-28 DIAGNOSIS — I82409 Acute embolism and thrombosis of unspecified deep veins of unspecified lower extremity: Secondary | ICD-10-CM | POA: Diagnosis not present

## 2019-12-28 DIAGNOSIS — M25511 Pain in right shoulder: Secondary | ICD-10-CM | POA: Diagnosis not present

## 2019-12-28 DIAGNOSIS — Z8546 Personal history of malignant neoplasm of prostate: Secondary | ICD-10-CM | POA: Diagnosis not present

## 2019-12-29 DIAGNOSIS — F0150 Vascular dementia without behavioral disturbance: Secondary | ICD-10-CM | POA: Diagnosis not present

## 2019-12-29 DIAGNOSIS — M25511 Pain in right shoulder: Secondary | ICD-10-CM | POA: Diagnosis not present

## 2019-12-29 DIAGNOSIS — M6281 Muscle weakness (generalized): Secondary | ICD-10-CM | POA: Diagnosis not present

## 2019-12-29 DIAGNOSIS — I471 Supraventricular tachycardia: Secondary | ICD-10-CM | POA: Diagnosis not present

## 2019-12-29 DIAGNOSIS — I82409 Acute embolism and thrombosis of unspecified deep veins of unspecified lower extremity: Secondary | ICD-10-CM | POA: Diagnosis not present

## 2019-12-29 DIAGNOSIS — Z8546 Personal history of malignant neoplasm of prostate: Secondary | ICD-10-CM | POA: Diagnosis not present

## 2020-01-01 DIAGNOSIS — M6281 Muscle weakness (generalized): Secondary | ICD-10-CM | POA: Diagnosis not present

## 2020-01-01 DIAGNOSIS — I471 Supraventricular tachycardia: Secondary | ICD-10-CM | POA: Diagnosis not present

## 2020-01-01 DIAGNOSIS — M25511 Pain in right shoulder: Secondary | ICD-10-CM | POA: Diagnosis not present

## 2020-01-01 DIAGNOSIS — Z8546 Personal history of malignant neoplasm of prostate: Secondary | ICD-10-CM | POA: Diagnosis not present

## 2020-01-01 DIAGNOSIS — I82409 Acute embolism and thrombosis of unspecified deep veins of unspecified lower extremity: Secondary | ICD-10-CM | POA: Diagnosis not present

## 2020-01-01 DIAGNOSIS — F0150 Vascular dementia without behavioral disturbance: Secondary | ICD-10-CM | POA: Diagnosis not present

## 2020-01-03 DIAGNOSIS — F0150 Vascular dementia without behavioral disturbance: Secondary | ICD-10-CM | POA: Diagnosis not present

## 2020-01-03 DIAGNOSIS — Z8546 Personal history of malignant neoplasm of prostate: Secondary | ICD-10-CM | POA: Diagnosis not present

## 2020-01-03 DIAGNOSIS — I82409 Acute embolism and thrombosis of unspecified deep veins of unspecified lower extremity: Secondary | ICD-10-CM | POA: Diagnosis not present

## 2020-01-03 DIAGNOSIS — M25511 Pain in right shoulder: Secondary | ICD-10-CM | POA: Diagnosis not present

## 2020-01-03 DIAGNOSIS — I471 Supraventricular tachycardia: Secondary | ICD-10-CM | POA: Diagnosis not present

## 2020-01-03 DIAGNOSIS — M6281 Muscle weakness (generalized): Secondary | ICD-10-CM | POA: Diagnosis not present

## 2020-01-05 DIAGNOSIS — I82409 Acute embolism and thrombosis of unspecified deep veins of unspecified lower extremity: Secondary | ICD-10-CM | POA: Diagnosis not present

## 2020-01-05 DIAGNOSIS — F0150 Vascular dementia without behavioral disturbance: Secondary | ICD-10-CM | POA: Diagnosis not present

## 2020-01-05 DIAGNOSIS — Z8546 Personal history of malignant neoplasm of prostate: Secondary | ICD-10-CM | POA: Diagnosis not present

## 2020-01-05 DIAGNOSIS — M6281 Muscle weakness (generalized): Secondary | ICD-10-CM | POA: Diagnosis not present

## 2020-01-05 DIAGNOSIS — M25511 Pain in right shoulder: Secondary | ICD-10-CM | POA: Diagnosis not present

## 2020-01-05 DIAGNOSIS — I471 Supraventricular tachycardia: Secondary | ICD-10-CM | POA: Diagnosis not present

## 2020-01-08 DIAGNOSIS — M25511 Pain in right shoulder: Secondary | ICD-10-CM | POA: Diagnosis not present

## 2020-01-08 DIAGNOSIS — F0150 Vascular dementia without behavioral disturbance: Secondary | ICD-10-CM | POA: Diagnosis not present

## 2020-01-08 DIAGNOSIS — Z8546 Personal history of malignant neoplasm of prostate: Secondary | ICD-10-CM | POA: Diagnosis not present

## 2020-01-08 DIAGNOSIS — I471 Supraventricular tachycardia: Secondary | ICD-10-CM | POA: Diagnosis not present

## 2020-01-08 DIAGNOSIS — M6281 Muscle weakness (generalized): Secondary | ICD-10-CM | POA: Diagnosis not present

## 2020-01-08 DIAGNOSIS — I82409 Acute embolism and thrombosis of unspecified deep veins of unspecified lower extremity: Secondary | ICD-10-CM | POA: Diagnosis not present

## 2020-01-10 DIAGNOSIS — M6281 Muscle weakness (generalized): Secondary | ICD-10-CM | POA: Diagnosis not present

## 2020-01-10 DIAGNOSIS — I471 Supraventricular tachycardia: Secondary | ICD-10-CM | POA: Diagnosis not present

## 2020-01-10 DIAGNOSIS — M25511 Pain in right shoulder: Secondary | ICD-10-CM | POA: Diagnosis not present

## 2020-01-10 DIAGNOSIS — I82409 Acute embolism and thrombosis of unspecified deep veins of unspecified lower extremity: Secondary | ICD-10-CM | POA: Diagnosis not present

## 2020-01-10 DIAGNOSIS — F0150 Vascular dementia without behavioral disturbance: Secondary | ICD-10-CM | POA: Diagnosis not present

## 2020-01-10 DIAGNOSIS — Z8546 Personal history of malignant neoplasm of prostate: Secondary | ICD-10-CM | POA: Diagnosis not present

## 2020-01-12 DIAGNOSIS — Z8546 Personal history of malignant neoplasm of prostate: Secondary | ICD-10-CM | POA: Diagnosis not present

## 2020-01-12 DIAGNOSIS — I471 Supraventricular tachycardia: Secondary | ICD-10-CM | POA: Diagnosis not present

## 2020-01-12 DIAGNOSIS — M6281 Muscle weakness (generalized): Secondary | ICD-10-CM | POA: Diagnosis not present

## 2020-01-12 DIAGNOSIS — F0150 Vascular dementia without behavioral disturbance: Secondary | ICD-10-CM | POA: Diagnosis not present

## 2020-01-12 DIAGNOSIS — M25511 Pain in right shoulder: Secondary | ICD-10-CM | POA: Diagnosis not present

## 2020-01-12 DIAGNOSIS — I82409 Acute embolism and thrombosis of unspecified deep veins of unspecified lower extremity: Secondary | ICD-10-CM | POA: Diagnosis not present

## 2020-01-15 DIAGNOSIS — I471 Supraventricular tachycardia: Secondary | ICD-10-CM | POA: Diagnosis not present

## 2020-01-15 DIAGNOSIS — M25511 Pain in right shoulder: Secondary | ICD-10-CM | POA: Diagnosis not present

## 2020-01-15 DIAGNOSIS — I82409 Acute embolism and thrombosis of unspecified deep veins of unspecified lower extremity: Secondary | ICD-10-CM | POA: Diagnosis not present

## 2020-01-15 DIAGNOSIS — F0150 Vascular dementia without behavioral disturbance: Secondary | ICD-10-CM | POA: Diagnosis not present

## 2020-01-15 DIAGNOSIS — Z8546 Personal history of malignant neoplasm of prostate: Secondary | ICD-10-CM | POA: Diagnosis not present

## 2020-01-15 DIAGNOSIS — M6281 Muscle weakness (generalized): Secondary | ICD-10-CM | POA: Diagnosis not present

## 2020-01-16 DIAGNOSIS — H34812 Central retinal vein occlusion, left eye, with macular edema: Secondary | ICD-10-CM | POA: Diagnosis not present

## 2020-01-18 DIAGNOSIS — M25511 Pain in right shoulder: Secondary | ICD-10-CM | POA: Diagnosis not present

## 2020-01-18 DIAGNOSIS — I82409 Acute embolism and thrombosis of unspecified deep veins of unspecified lower extremity: Secondary | ICD-10-CM | POA: Diagnosis not present

## 2020-01-18 DIAGNOSIS — M6281 Muscle weakness (generalized): Secondary | ICD-10-CM | POA: Diagnosis not present

## 2020-01-18 DIAGNOSIS — I471 Supraventricular tachycardia: Secondary | ICD-10-CM | POA: Diagnosis not present

## 2020-01-18 DIAGNOSIS — F0150 Vascular dementia without behavioral disturbance: Secondary | ICD-10-CM | POA: Diagnosis not present

## 2020-01-18 DIAGNOSIS — Z8546 Personal history of malignant neoplasm of prostate: Secondary | ICD-10-CM | POA: Diagnosis not present

## 2020-01-19 DIAGNOSIS — I471 Supraventricular tachycardia: Secondary | ICD-10-CM | POA: Diagnosis not present

## 2020-01-19 DIAGNOSIS — M6281 Muscle weakness (generalized): Secondary | ICD-10-CM | POA: Diagnosis not present

## 2020-01-19 DIAGNOSIS — I82409 Acute embolism and thrombosis of unspecified deep veins of unspecified lower extremity: Secondary | ICD-10-CM | POA: Diagnosis not present

## 2020-01-19 DIAGNOSIS — Z8546 Personal history of malignant neoplasm of prostate: Secondary | ICD-10-CM | POA: Diagnosis not present

## 2020-01-19 DIAGNOSIS — F0150 Vascular dementia without behavioral disturbance: Secondary | ICD-10-CM | POA: Diagnosis not present

## 2020-01-19 DIAGNOSIS — M25511 Pain in right shoulder: Secondary | ICD-10-CM | POA: Diagnosis not present

## 2020-03-05 DIAGNOSIS — I471 Supraventricular tachycardia: Secondary | ICD-10-CM | POA: Diagnosis not present

## 2020-03-05 DIAGNOSIS — E1159 Type 2 diabetes mellitus with other circulatory complications: Secondary | ICD-10-CM | POA: Diagnosis not present

## 2020-03-05 DIAGNOSIS — C61 Malignant neoplasm of prostate: Secondary | ICD-10-CM | POA: Diagnosis not present

## 2020-03-05 DIAGNOSIS — F0151 Vascular dementia with behavioral disturbance: Secondary | ICD-10-CM | POA: Diagnosis not present

## 2020-03-11 DIAGNOSIS — H34812 Central retinal vein occlusion, left eye, with macular edema: Secondary | ICD-10-CM | POA: Diagnosis not present

## 2020-03-21 DIAGNOSIS — C61 Malignant neoplasm of prostate: Secondary | ICD-10-CM | POA: Diagnosis not present

## 2020-04-18 DIAGNOSIS — R233 Spontaneous ecchymoses: Secondary | ICD-10-CM | POA: Diagnosis not present

## 2020-04-26 DIAGNOSIS — F0150 Vascular dementia without behavioral disturbance: Secondary | ICD-10-CM | POA: Diagnosis not present

## 2020-04-26 DIAGNOSIS — C61 Malignant neoplasm of prostate: Secondary | ICD-10-CM | POA: Diagnosis not present

## 2020-04-26 DIAGNOSIS — I471 Supraventricular tachycardia: Secondary | ICD-10-CM | POA: Diagnosis not present

## 2020-06-24 DIAGNOSIS — I471 Supraventricular tachycardia: Secondary | ICD-10-CM | POA: Diagnosis not present

## 2020-06-24 DIAGNOSIS — F0150 Vascular dementia without behavioral disturbance: Secondary | ICD-10-CM | POA: Diagnosis not present

## 2020-06-24 DIAGNOSIS — E1159 Type 2 diabetes mellitus with other circulatory complications: Secondary | ICD-10-CM | POA: Diagnosis not present

## 2020-06-24 DIAGNOSIS — C61 Malignant neoplasm of prostate: Secondary | ICD-10-CM | POA: Diagnosis not present

## 2020-07-09 DIAGNOSIS — H34812 Central retinal vein occlusion, left eye, with macular edema: Secondary | ICD-10-CM | POA: Diagnosis not present

## 2020-08-30 DIAGNOSIS — F0150 Vascular dementia without behavioral disturbance: Secondary | ICD-10-CM | POA: Diagnosis not present

## 2020-08-30 DIAGNOSIS — C61 Malignant neoplasm of prostate: Secondary | ICD-10-CM | POA: Diagnosis not present

## 2020-08-30 DIAGNOSIS — I471 Supraventricular tachycardia: Secondary | ICD-10-CM | POA: Diagnosis not present

## 2020-08-30 DIAGNOSIS — E1159 Type 2 diabetes mellitus with other circulatory complications: Secondary | ICD-10-CM | POA: Diagnosis not present

## 2020-09-03 DIAGNOSIS — H34812 Central retinal vein occlusion, left eye, with macular edema: Secondary | ICD-10-CM | POA: Diagnosis not present

## 2020-09-27 DIAGNOSIS — H40053 Ocular hypertension, bilateral: Secondary | ICD-10-CM | POA: Diagnosis not present

## 2020-11-04 DIAGNOSIS — F0150 Vascular dementia without behavioral disturbance: Secondary | ICD-10-CM | POA: Diagnosis not present

## 2020-11-04 DIAGNOSIS — I471 Supraventricular tachycardia: Secondary | ICD-10-CM | POA: Diagnosis not present

## 2020-11-04 DIAGNOSIS — E1159 Type 2 diabetes mellitus with other circulatory complications: Secondary | ICD-10-CM | POA: Diagnosis not present

## 2020-11-05 DIAGNOSIS — H34812 Central retinal vein occlusion, left eye, with macular edema: Secondary | ICD-10-CM | POA: Diagnosis not present

## 2020-11-05 DIAGNOSIS — H40053 Ocular hypertension, bilateral: Secondary | ICD-10-CM | POA: Diagnosis not present

## 2020-11-05 LAB — HM DIABETES EYE EXAM

## 2021-01-01 DIAGNOSIS — F0150 Vascular dementia without behavioral disturbance: Secondary | ICD-10-CM | POA: Diagnosis not present

## 2021-01-01 DIAGNOSIS — C61 Malignant neoplasm of prostate: Secondary | ICD-10-CM | POA: Diagnosis not present

## 2021-01-01 DIAGNOSIS — E1140 Type 2 diabetes mellitus with diabetic neuropathy, unspecified: Secondary | ICD-10-CM | POA: Diagnosis not present

## 2021-01-01 DIAGNOSIS — I471 Supraventricular tachycardia: Secondary | ICD-10-CM | POA: Diagnosis not present

## 2021-02-21 ENCOUNTER — Emergency Department
Admission: EM | Admit: 2021-02-21 | Discharge: 2021-02-22 | Disposition: A | Discharge status: Other Acute Inpatient Hospital | Payer: Medicare Other | Attending: Emergency Medicine | Admitting: Emergency Medicine

## 2021-02-21 ENCOUNTER — Emergency Department: Payer: Medicare Other

## 2021-02-21 ENCOUNTER — Other Ambulatory Visit: Payer: Self-pay

## 2021-02-21 DIAGNOSIS — R112 Nausea with vomiting, unspecified: Secondary | ICD-10-CM | POA: Insufficient documentation

## 2021-02-21 DIAGNOSIS — Z8546 Personal history of malignant neoplasm of prostate: Secondary | ICD-10-CM | POA: Diagnosis not present

## 2021-02-21 DIAGNOSIS — E119 Type 2 diabetes mellitus without complications: Secondary | ICD-10-CM | POA: Insufficient documentation

## 2021-02-21 DIAGNOSIS — Z20822 Contact with and (suspected) exposure to covid-19: Secondary | ICD-10-CM | POA: Diagnosis not present

## 2021-02-21 DIAGNOSIS — K5900 Constipation, unspecified: Secondary | ICD-10-CM | POA: Diagnosis not present

## 2021-02-21 DIAGNOSIS — R1110 Vomiting, unspecified: Secondary | ICD-10-CM

## 2021-02-21 DIAGNOSIS — A419 Sepsis, unspecified organism: Secondary | ICD-10-CM | POA: Diagnosis not present

## 2021-02-21 DIAGNOSIS — I10 Essential (primary) hypertension: Secondary | ICD-10-CM | POA: Diagnosis not present

## 2021-02-21 DIAGNOSIS — R531 Weakness: Secondary | ICD-10-CM | POA: Diagnosis not present

## 2021-02-21 DIAGNOSIS — R109 Unspecified abdominal pain: Secondary | ICD-10-CM

## 2021-02-21 DIAGNOSIS — R1084 Generalized abdominal pain: Secondary | ICD-10-CM | POA: Diagnosis not present

## 2021-02-21 DIAGNOSIS — R509 Fever, unspecified: Secondary | ICD-10-CM | POA: Diagnosis present

## 2021-02-21 LAB — COMPREHENSIVE METABOLIC PANEL
ALT: 156 U/L — ABNORMAL HIGH (ref 0–44)
AST: 142 U/L — ABNORMAL HIGH (ref 15–41)
Albumin: 3.9 g/dL (ref 3.5–5.0)
Alkaline Phosphatase: 176 U/L — ABNORMAL HIGH (ref 38–126)
Anion gap: 12 (ref 5–15)
BUN: 20 mg/dL (ref 8–23)
CO2: 23 mmol/L (ref 22–32)
Calcium: 8.9 mg/dL (ref 8.9–10.3)
Chloride: 100 mmol/L (ref 98–111)
Creatinine, Ser: 0.52 mg/dL — ABNORMAL LOW (ref 0.61–1.24)
GFR, Estimated: >60 mL/min (ref >60)
Glucose, Bld: 169 mg/dL — ABNORMAL HIGH (ref 70–99)
Potassium: 2.9 mmol/L — ABNORMAL LOW (ref 3.5–5.1)
Sodium: 135 mmol/L (ref 135–145)
Total Bilirubin: 8.6 mg/dL — ABNORMAL HIGH (ref 0.3–1.2)
Total Protein: 7 g/dL (ref 6.5–8.1)

## 2021-02-21 LAB — CBC WITH DIFFERENTIAL/PLATELET
Abs Immature Granulocytes: 0.02 10*3/uL (ref 0.00–0.07)
Basophils Absolute: 0 10*3/uL (ref 0.0–0.1)
Basophils Relative: 0 %
Eosinophils Absolute: 0 10*3/uL (ref 0.0–0.5)
Eosinophils Relative: 0 %
HCT: 38.1 % — ABNORMAL LOW (ref 39.0–52.0)
Hemoglobin: 12.5 g/dL — ABNORMAL LOW (ref 13.0–17.0)
Immature Granulocytes: 0 %
Lymphocytes Relative: 7 %
Lymphs Abs: 0.4 10*3/uL — ABNORMAL LOW (ref 0.7–4.0)
MCH: 28.5 pg (ref 26.0–34.0)
MCHC: 32.8 g/dL (ref 30.0–36.0)
MCV: 86.8 fL (ref 80.0–100.0)
Monocytes Absolute: 0.1 10*3/uL (ref 0.1–1.0)
Monocytes Relative: 2 %
Neutro Abs: 4.9 10*3/uL (ref 1.7–7.7)
Neutrophils Relative %: 91 %
Platelets: 189 10*3/uL (ref 150–400)
RBC: 4.39 MIL/uL (ref 4.22–5.81)
RDW: 14.1 % (ref 11.5–15.5)
WBC: 5.4 10*3/uL (ref 4.0–10.5)
nRBC: 0 % (ref 0.0–0.2)

## 2021-02-21 LAB — RESP PANEL BY RT-PCR (FLU A&B, COVID) ARPGX2
Influenza A by PCR: NEGATIVE
Influenza B by PCR: NEGATIVE
SARS Coronavirus 2 by RT PCR: NEGATIVE

## 2021-02-21 LAB — URINALYSIS, COMPLETE (UACMP) WITH MICROSCOPIC
Bacteria, UA: NONE SEEN
Bilirubin Urine: NEGATIVE
Glucose, UA: 50 mg/dL — AB
Hgb urine dipstick: NEGATIVE
Ketones, ur: 5 mg/dL — AB
Leukocytes,Ua: NEGATIVE
Nitrite: NEGATIVE
Protein, ur: NEGATIVE mg/dL
Specific Gravity, Urine: 1.018 (ref 1.005–1.030)
Squamous Epithelial / LPF: NONE SEEN (ref 0–5)
pH: 6 (ref 5.0–8.0)

## 2021-02-21 LAB — LACTIC ACID, PLASMA
Lactic Acid, Venous: 2.2 mmol/L (ref 0.5–1.9)
Lactic Acid, Venous: 1.5 mmol/L (ref 0.5–1.9)

## 2021-02-21 LAB — PROTIME-INR
INR: 1.2 (ref 0.8–1.2)
Prothrombin Time: 15 seconds (ref 11.4–15.2)

## 2021-02-21 LAB — LIPASE, BLOOD: Lipase: 1247 U/L — ABNORMAL HIGH (ref 11–51)

## 2021-02-21 MED ORDER — POTASSIUM CHLORIDE 10 MEQ/100ML IV SOLN
10.0000 meq | INTRAVENOUS | Status: AC
  Administered 2021-02-21 (×3): 10 meq via INTRAVENOUS
  Filled 2021-02-21 (×3): qty 100

## 2021-02-21 MED ORDER — METRONIDAZOLE IN NACL 5-0.79 MG/ML-% IV SOLN
500.0000 mg | Freq: Once | INTRAVENOUS | Status: AC
  Administered 2021-02-21: 500 mg via INTRAVENOUS
  Filled 2021-02-21: qty 100

## 2021-02-21 MED ORDER — LACTATED RINGERS IV BOLUS (SEPSIS)
1000.0000 mL | Freq: Once | INTRAVENOUS | Status: AC
  Administered 2021-02-21: 1000 mL via INTRAVENOUS

## 2021-02-21 MED ORDER — VANCOMYCIN HCL 1500 MG/300ML IV SOLN
1500.0000 mg | Freq: Once | INTRAVENOUS | Status: AC
  Administered 2021-02-21: 1500 mg via INTRAVENOUS
  Filled 2021-02-21: qty 300

## 2021-02-21 MED ORDER — ADENOSINE 6 MG/2ML IV SOLN
INTRAVENOUS | Status: AC
  Administered 2021-02-21: 6 mg via INTRAVENOUS
  Filled 2021-02-21: qty 2

## 2021-02-21 MED ORDER — ADENOSINE 6 MG/2ML IV SOLN
INTRAVENOUS | Status: AC
  Administered 2021-02-21: 6 mg
  Filled 2021-02-21: qty 2

## 2021-02-21 MED ORDER — ADENOSINE 6 MG/2ML IV SOLN: 6.0000 mg | Freq: Once | INTRAVENOUS | Status: AC

## 2021-02-21 MED ORDER — SODIUM CHLORIDE 0.9 % IV BOLUS: 1000.0000 mL | Freq: Once | INTRAVENOUS | Status: DC

## 2021-02-21 MED ORDER — LACTATED RINGERS IV SOLN: INTRAVENOUS | Status: DC

## 2021-02-21 MED ORDER — ADENOSINE 12 MG/4ML IV SOLN
INTRAVENOUS | Status: AC
  Administered 2021-02-21: 12 mg
  Filled 2021-02-21: qty 4

## 2021-02-21 MED ORDER — ACETAMINOPHEN 650 MG RE SUPP
650.0000 mg | Freq: Once | RECTAL | Status: AC
  Administered 2021-02-21: 650 mg via RECTAL
  Filled 2021-02-21: qty 1

## 2021-02-21 MED ORDER — ONDANSETRON HCL 4 MG/2ML IJ SOLN
4.0000 mg | Freq: Once | INTRAMUSCULAR | Status: AC
  Administered 2021-02-21: 4 mg via INTRAVENOUS
  Filled 2021-02-21: qty 2

## 2021-02-21 MED ORDER — DILTIAZEM HCL 25 MG/5ML IV SOLN
5.0000 mg | Freq: Once | INTRAVENOUS | Status: AC
  Administered 2021-02-21: 5 mg via INTRAVENOUS

## 2021-02-21 MED ORDER — METRONIDAZOLE IN NACL 5-0.79 MG/ML-% IV SOLN
500.0000 mg | Freq: Once | INTRAVENOUS | Status: AC
  Administered 2021-02-22: 500 mg via INTRAVENOUS
  Filled 2021-02-21: qty 100

## 2021-02-21 MED ORDER — SODIUM CHLORIDE 0.9 % IV SOLN
2.0000 g | Freq: Once | INTRAVENOUS | Status: AC
  Administered 2021-02-21: 2 g via INTRAVENOUS
  Filled 2021-02-21: qty 2

## 2021-02-21 MED ORDER — DILTIAZEM HCL 25 MG/5ML IV SOLN
10.0000 mg | Freq: Once | INTRAVENOUS | Status: AC
  Administered 2021-02-21: 10 mg via INTRAVENOUS
  Filled 2021-02-21: qty 5

## 2021-02-21 MED ORDER — IOHEXOL 300 MG/ML  SOLN
100.0000 mL | Freq: Once | INTRAMUSCULAR | Status: AC | PRN
  Administered 2021-02-21: 100 mL via INTRAVENOUS

## 2021-02-21 MED ORDER — VANCOMYCIN HCL IN DEXTROSE 1-5 GM/200ML-% IV SOLN: 1000.0000 mg | Freq: Once | INTRAVENOUS | Status: DC

## 2021-02-21 MED ORDER — GADOBUTROL 1 MMOL/ML IV SOLN
5.0000 mL | Freq: Once | INTRAVENOUS | Status: AC | PRN
  Administered 2021-02-21: 7.5 mL via INTRAVENOUS

## 2021-02-21 NOTE — ED Notes (Addendum)
1758 - 6mg  of adenosine given in 20g IV in left antecubital. See previously charted EKG that showed a rhythm of SVT. Heart rate at this time is 152.  1759 converted to sinus rhythm with heart rate of 90.   Repeat EKG obtained at 1559, given to Dr Archie Balboa.  Pt tolerated procedure well, awake and alert for entire procedure and afterwards.   1800 rhythm is SVT, heart rate 164. Dr Archie Balboa still present at bedside.  1801 12mg  adenosine given in 20g IV in left antecubital.   1802 sinus rhythm on the monitor with heart rate of 97. EKG obtained and reviewed by Dr Archie Balboa who is still present at bedside. Patient again tolerated this procedure with no complaints, was awake and alert during procedure and afterwards.

## 2021-02-21 NOTE — ED Notes (Signed)
Continuous cardiac and pulse ox monitoring in use.

## 2021-02-21 NOTE — ED Notes (Signed)
Pt is in SVT. Goodman,MD at bedside. Pre medication HR 141. 6mg  adenosine given at 1933. HR 139 at 1934. 12mg  adenosine given at 1936. HR 90 at 1938. EKG printed and given to MD.

## 2021-02-21 NOTE — ED Notes (Signed)
Pt to MRI

## 2021-02-21 NOTE — ED Provider Notes (Signed)
Patient CT scan showed some concern for gallbladder wall thickening and bile duct dilatation. [place] was then obtained. [place] did not show obvious signs of gallstones or CBD dilation. However did have concern for possible cholangitis given fever, elevated bili. Discussed with Dr. Hilarie Fredrickson with GI at [facility]. Recommended MRCP which was performed and did show stone in CBD. Because of this patient will be transferred to [facility] for ERCP. Additionally prior to MRCP being performed patient went into SVT with heart rates in the 150s. Patient does have a history of SVT. Adenosine was initially tried however did not convert patient back to normal rhythm. He was then given multiple doses of IV diltiazem. However did not want to give high dose given low blood pressure. The patient did eventually convert back to normal sinus rhythm.  CRITICAL CARE Performed by: Nance Pear   Total critical care time: 45 minutes  Critical care time was exclusive of separately billable procedures and treating other patients.  Critical care was necessary to treat or prevent imminent or life-threatening deterioration.  Critical care was time spent personally by me on the following activities: development of treatment plan with patient and/or surrogate as well as nursing, discussions with consultants, evaluation of patient's response to treatment, examination of patient, obtaining history from patient or surrogate, ordering and performing treatments and interventions, ordering and review of laboratory studies, ordering and review of radiographic studies, pulse oximetry and re-evaluation of patient's condition.    Nance Pear, MD 02/21/21 [phone]

## 2021-02-21 NOTE — Sepsis Progress Note (Signed)
Code sepsis protocol being monitored by [facility]. 

## 2021-02-21 NOTE — ED Notes (Signed)
Called [facility] to request transfer for continum of care spoke to [place] who will connect providers for consultation

## 2021-02-21 NOTE — ED Triage Notes (Signed)
Pt from [facility] with abdominal pain and nausea x3 days. Nurse from facility reported doing a digital exam and that he is impacted. Patient reports having a small bowel movement this morning.

## 2021-02-21 NOTE — ED Notes (Signed)
Called [facility] Millard) for hospitalist to return call to Archie Balboa, MD

## 2021-02-21 NOTE — Progress Notes (Signed)
Called by [facility] regarding this patient. Chart reviewed. Elevated LFTs and abnl GI imaging.  GB is abnormal and bile ducts are dilated.  Lipase up but no pancreatitis by CT.  ER MD at [facility] reports nontender on most recent exam. Question is need for ERCP versus surgical consultation for cholecystectomy.  My recommendation is as follows: MRCP stat at [facility] ER -- if positive then transfer to [facility] for ERCP.  If negative for CBD stone then surgical consult at [facility]. Trend LFTs Empiric antibiotics with broad spectrum coverage.  I am available and on call for [facility] through the weekend if needed.

## 2021-02-21 NOTE — Consult Note (Signed)
CODE SEPSIS - PHARMACY COMMUNICATION  **Broad Spectrum Antibiotics should be administered within 1 hour of Sepsis diagnosis**  Time Code Sepsis Called/Page Received: 1403  Antibiotics Ordered: vancomycin, cefepime  Time of 1st antibiotic administration: 1440  Additional action taken by pharmacy: n/a  If necessary, Name of Provider/Nurse Contacted: [place] ,PharmD, BCPS Clinical Pharmacist  02/21/2021  2:07 PM

## 2021-02-21 NOTE — ED Notes (Signed)
Pt has been accepted to [facility] per Archie Balboa, MD. Awaiting bed assignment at this time

## 2021-02-21 NOTE — ED Notes (Signed)
High fall risk precautions in place. Side rails up x2 with call light in reach, bed alarm in use and yellow bracelet placed on pt. Patient educated on calling for assistance when he needs to get out of bed.

## 2021-02-21 NOTE — ED Provider Notes (Signed)
[facility] Emergency Department Provider Note  ____________________________________________   Event Date/Time   First MD Initiated Contact with Patient 02/21/21 1307     (approximate)  I have reviewed the triage vital signs and the nursing notes.   HISTORY  Chief Complaint Abdominal Pain    HPI Hector Lucero is a 87 y.o. male  Here with abd pain, fever. Pt reports that for the last 2 days, he has had progressively worsening diffuse abd pain along with nausea. He has had emesis and constipation. Denies known sick contacts. He has been unable to eat/drink for 24 hours and has had poor PO intake. Reports he has also had chills and general weakness. Moderate difficulty walking 2/2 weakness. No alleviating or aggravating factors, other than any movement exacerbating his weakness/fatigue.        Past Medical History:  Diagnosis Date  . Atrial fibrillation ([place])   . Diabetes mellitus without complication ([place])   . Fatigue   . HLD (hyperlipidemia)   . Hypertension   . OSA (obstructive sleep apnea)   . Radial fracture 2006   right, screws and plates  . SVT (supraventricular tachycardia) ([place]) 2012   a. Holter in 2012 showed 27 episodes of SVT/atrial tach w/ longest being 36 beats; b. asymptomatic  . Syncope 08/2015   a. short rhythm strip in [facility] ED showed junctional escape rhythm and AV dissociation with essentially sinus bradycardia  . Ulnar fracture 1995   fall off of ladder    Patient Active Problem List   Diagnosis Date Noted  . Chest wall pain 03/12/2017  . Advance directive discussed with patient 01/02/2016  . SVT (supraventricular tachycardia) ([place]) 01/15/2015  . Vascular dementia without behavioral disturbance ([place]) 09/16/2014  . Cancer of prostate w/low recurrence risk (T1-2a, Gleason<7 & PSA<10) ([place]) 03/09/2014  . Routine general medical examination at a health care facility 06/23/2013  . Diabetes mellitus type 2, diet-controlled ([place])  12/24/2011  . Hyperlipidemia with target LDL less than 70 12/24/2011    Past Surgical History:  Procedure Laterality Date  . HERNIA REPAIR  Aug 2012   right indirect inguinal, with mesh    Prior to Admission medications   Medication Sig Start Date End Date Taking? Authorizing Provider  acetaminophen (TYLENOL) 500 MG tablet Take 1,000 mg by mouth every 4 (four) hours as needed for mild pain or fever.   Yes [provider]  carboxymethylcellulose (ARTIFICIAL TEARS) 1 % ophthalmic solution Apply 1 drop to eye 2 (two) times daily as needed (dry eyes).   Yes [provider]  Multiple Vitamins-Minerals (MENS 50+ MULTI VITAMIN/MIN PO) Take 1 tablet by mouth daily.   Yes [provider]  vitamin B-12 (CYANOCOBALAMIN) 1000 MCG tablet Take 1,000 mcg by mouth daily.   Yes [provider]    Allergies Patient has no known allergies.  Family History  Problem Relation Age of Onset  . Diabetes Mother     Social History Social History   Tobacco Use  . Smoking status: Never Smoker  . Smokeless tobacco: Never Used  Substance Use Topics  . Alcohol use: Yes    Alcohol/week: 3.0 standard drinks    Types: 3 Glasses of wine per week    Comment: occasional  . Drug use: No    Review of Systems  Review of Systems  Constitutional: Positive for chills and fatigue. Negative for fever.  HENT: Negative for sore throat.   Respiratory: Negative for shortness of breath.   Cardiovascular:  Negative for chest pain.  Gastrointestinal: Positive for abdominal pain, nausea and vomiting.  Genitourinary: Negative for flank pain.  Musculoskeletal: Negative for neck pain.  Skin: Negative for rash and wound.  Allergic/Immunologic: Negative for immunocompromised state.  Neurological: Positive for weakness. Negative for numbness.  Hematological: Does not bruise/bleed easily.  All other systems reviewed and are negative.     ____________________________________________  PHYSICAL EXAM:      VITAL SIGNS: ED Triage Vitals  Enc Vitals Group     BP 02/21/21 1310 (!) 119/100     Pulse Rate 02/21/21 1310 (!) 116     Resp 02/21/21 1310 17     Temp 02/21/21 1310 98.8 F (37.1 C)     Temp Source 02/21/21 1310 Oral     SpO2 02/21/21 1310 92 %     Weight 02/21/21 1307 142 lb (64.4 kg)     Height 02/21/21 1307 5\' 8"  (1.727 m)     Head Circumference --      Peak Flow --      Pain Score 02/21/21 1306 4     Pain Loc --      Pain Edu? --      Excl. in [place]? --      Physical Exam Vitals and nursing note reviewed.  Constitutional:      General: He is not in acute distress.    Appearance: He is well-developed.  HENT:     Head: Normocephalic and atraumatic.  Eyes:     Conjunctiva/sclera: Conjunctivae normal.  Cardiovascular:     Rate and Rhythm: Regular rhythm. Tachycardia present.     Heart sounds: Normal heart sounds. No murmur heard. No friction rub.  Pulmonary:     Effort: Pulmonary effort is normal. No respiratory distress.     Breath sounds: Normal breath sounds. No wheezing or rales.  Abdominal:     General: There is no distension.     Palpations: Abdomen is soft.     Tenderness: There is generalized abdominal tenderness. There is no guarding or rebound.  Musculoskeletal:     Cervical back: Neck supple.  Skin:    General: Skin is warm.     Capillary Refill: Capillary refill takes less than 2 seconds.  Neurological:     Mental Status: He is alert and oriented to person, place, and time.     Motor: No abnormal muscle tone.       ____________________________________________   LABS (all labs ordered are listed, but only abnormal results are displayed)  Labs Reviewed  CBC WITH DIFFERENTIAL/PLATELET - Abnormal; Notable for the following components:      Result Value   Hemoglobin 12.5 (*)    HCT 38.1 (*)    Lymphs Abs 0.4 (*)    All other components within normal limits  COMPREHENSIVE  METABOLIC PANEL - Abnormal; Notable for the following components:   Potassium 2.9 (*)    Glucose, Bld 169 (*)    Creatinine, Ser 0.52 (*)    AST 142 (*)    ALT 156 (*)    Alkaline Phosphatase 176 (*)    Total Bilirubin 8.6 (*)    All other components within normal limits  LIPASE, BLOOD - Abnormal; Notable for the following components:   Lipase 1,247 (*)    All other components within normal limits  LACTIC ACID, PLASMA - Abnormal; Notable for the following components:   Lactic Acid, Venous 2.2 (*)    All other components within normal limits  URINALYSIS, COMPLETE (UACMP)  WITH MICROSCOPIC - Abnormal; Notable for the following components:   Color, Urine AMBER (*)    APPearance CLEAR (*)    Glucose, UA 50 (*)    Ketones, ur 5 (*)    All other components within normal limits  RESP PANEL BY RT-PCR (FLU A&B, COVID) ARPGX2  CULTURE, BLOOD (SINGLE)  CULTURE, BLOOD (ROUTINE X 2)  CULTURE, BLOOD (ROUTINE X 2)  URINE CULTURE  LACTIC ACID, PLASMA  PROTIME-INR    ____________________________________________  EKG: Sinus tachycardia, VR 107. QRS 102, QTc significant prolonged at 672.  ________________________________________  RADIOLOGY All imaging, including plain films, CT scans, and ultrasounds, independently reviewed by me, and interpretations confirmed via formal radiology reads.  ED MD interpretation:   CXR: Pending  Official radiology report(s): CT ABDOMEN PELVIS W CONTRAST  Result Date: 02/21/2021 CLINICAL DATA:  Nonlocalized acute abdominal pain. EXAM: CT ABDOMEN AND PELVIS WITH CONTRAST TECHNIQUE: Multidetector CT imaging of the abdomen and pelvis was performed using the standard protocol following bolus administration of intravenous contrast. CONTRAST:  182mL OMNIPAQUE IOHEXOL 300 MG/ML  SOLN COMPARISON:  None. FINDINGS: Lower chest: Bilateral lower lobe airspace opacities. Limited evaluation due to respiratory motion artifact. Coronary artery calcifications. Hepatobiliary:  There is a simple fluid density 2.2 cm lesion within the left hepatic lobe that likely represents a simple hepatic cyst (2:23). No focal liver abnormality. No gallstones. No pericholecystic fluid. Question irregular gallbladder wall thickening (5:29). Mild diffuse intrahepatic biliary ductal dilatation. Common bile duct is enlarged measuring up to 12 mm. Pancreas: No focal lesion. Normal pancreatic contour. No surrounding inflammatory changes. No main pancreatic ductal dilatation. Spleen: Normal in size without focal abnormality. Adrenals/Urinary Tract: No adrenal nodule bilaterally. Bilateral kidneys enhance symmetrically. Subcentimeter hypodensities are too small to characterize. No hydronephrosis. No hydroureter. The urinary bladder is distended with urine. There is irregular urinary bladder wall thickening. Stomach/Bowel: Stomach is within normal limits. No evidence of bowel wall thickening or dilatation. Diffuse colonic diverticulosis. Appendix appears normal. Vascular/Lymphatic: No abdominal aorta or iliac aneurysm. Severe atherosclerotic plaque of the aorta and its branches. No abdominal, pelvic, or inguinal lymphadenopathy. Reproductive: The prostate is enlarged measuring up to 5.2 cm. Median lobe hypertrophy is noted with associated mass effect on the posterior bladder wall. Other: No intraperitoneal free fluid. No intraperitoneal free gas. No organized fluid collection. Musculoskeletal: Left inguinal hernia containing fat and a short loop of small bowel. Diffusely decreased bone density. No suspicious lytic or blastic osseous lesions. No acute displaced fracture. Multilevel degenerative changes of the spine. Multilevel vertebral body height loss with chronic appearing compression fractures of the L1 and L3 vertebral bodies. IMPRESSION: 1. Bilateral lower lobe airspace opacities that could represent a combination of atelectasis and/or infection/inflammation. 2. Question irregular gallbladder wall thickening  versus motion artifact. Associated common bile duct and intrahepatic biliary ductal dilatation. No pancreatic duct dilatation. Recommend right upper quadrant ultrasound for a more sensitive evaluation of the gallbladder. 3. Irregular urinary bladder wall thickening that may be related to obstructive uropathy in the setting of prostatomegaly; however, underlying infection or malignancy cannot be excluded. Recommend correlation with urinalysis and consideration of a urologic consultation. 4. Left inguinal hernia containing a short loop of small bowel with no findings to suggest associated ischemia or bowel obstruction. 5. Diffuse colonic diverticulosis with no acute diverticulitis. 6. Diffusely decreased bone density with chronic appearing compression fractures of the L1-L3 vertebral bodies. 7.  Aortic Atherosclerosis (ICD10-I70.0). Electronically Signed   By: Iven Finn M.D.   On: 02/21/2021 16:16  DG Chest Port 1 View  Result Date: 02/21/2021 CLINICAL DATA:  Abdominal pain and nausea for 3 days EXAM: PORTABLE CHEST 1 VIEW COMPARISON:  06/29/2011 FINDINGS: Single frontal view of the chest demonstrates a stable cardiac silhouette. Continued atherosclerosis of the thoracic aorta. There is increased density in the right paratracheal region, likely vascular shadow. Follow-up PA and lateral views of the chest may be useful for confirmation. Bilateral interstitial prominence compatible with scarring. No airspace disease, effusion, or pneumothorax. No acute bony abnormalities. S shaped scoliosis of the thoracolumbar spine. IMPRESSION: 1. Increased density right paratracheal region most consistent with vascular shadow. PA and lateral views of the chest may be useful for follow-up. 2. No acute airspace disease. Electronically Signed   By: Randa Ngo M.D.   On: 02/21/2021 15:28   US Abdomen Limited RUQ (LIVER/GB)  Result Date: 02/21/2021 CLINICAL DATA:  Vomiting EXAM: ULTRASOUND ABDOMEN LIMITED RIGHT UPPER  QUADRANT COMPARISON:  None. FINDINGS: Gallbladder: Mildly distended gallbladder. Slight gallbladder wall thickening measuring up to 5 mm. Small amount of sludge within the gallbladder. No visible stones. Negative sonographic Murphy's. Common bile duct: Diameter: Upper limits normal in diameter, 7 mm. Liver: 2.4 cm cyst in the left hepatic lobe. Normal echotexture. Portal vein is patent on color Doppler imaging with normal direction of blood flow towards the liver. Other: None. IMPRESSION: Mildly distended gallbladder with slight gallbladder wall thickening and a small amount of sludge in the gallbladder. No visible stones or sonographic Murphy sign. Electronically Signed   By: Rolm Baptise M.D.   On: 02/21/2021 17:09    ____________________________________________  PROCEDURES   Procedure(s) performed (including Critical Care):  .Critical Care Performed by: Duffy Bruce, MD Authorized by: Duffy Bruce, MD   Critical care provider statement:    Critical care time (minutes):  35   Critical care time was exclusive of:  Separately billable procedures and treating other patients and teaching time   Critical care was necessary to treat or prevent imminent or life-threatening deterioration of the following conditions:  Cardiac failure, circulatory failure, dehydration and sepsis   Critical care was time spent personally by me on the following activities:  Development of treatment plan with patient or surrogate, discussions with consultants, evaluation of patient's response to treatment, examination of patient, obtaining history from patient or surrogate, ordering and performing treatments and interventions, ordering and review of laboratory studies, ordering and review of radiographic studies, pulse oximetry, re-evaluation of patient's condition and review of old charts   I assumed direction of critical care for this patient from another provider in my specialty: no       ____________________________________________  INITIAL IMPRESSION / MDM / [place] / ED COURSE  As part of my medical decision making, I reviewed the following data within the [place] notes reviewed and incorporated, Old chart reviewed, Notes from prior ED visits, and [place] Controlled Substance Database       *Hector Lucero was evaluated in Emergency Department on 02/21/2021 for the symptoms described in the history of present illness. He was evaluated in the context of the global COVID-19 pandemic, which necessitated consideration that the patient might be at risk for infection with the SARS-CoV-2 virus that causes COVID-19. Institutional protocols and algorithms that pertain to the evaluation of patients at risk for COVID-19 are in a state of rapid change based on information released by regulatory bodies including the CDC and federal and state organizations. These policies and algorithms were followed during the  patient's care in the ED.  Some ED evaluations and interventions may be delayed as a result of limited staffing during the pandemic.*     Medical Decision Making:  87 yo M here with abdominal pain, vomitng. Pt febrile, tachycardic on arrival with concern for sepsis 2/2 intra-abd source. Broad-spectrum abx, fluids started and cultures sent. Abdomen diffusely tender - will check CT. Otherwise no cough, hypoxia or signs of PNA clinically. No apparent rash. Pt is alert, mildly confused but denies HA or signs of CNS involvement.  Lab work thus far shows significant transaminitis and hyperbilirubinemia. Will f/u CT, likely consider U/S for possible Gb pathology. Cefepime/Flagyl given.   Attempted to reach POA, Frank, at 3 PM without success. Dr. Archie Balboa to f/u labs, results, and dispo. ____________________________________________  FINAL CLINICAL IMPRESSION(S) / ED DIAGNOSES  Final diagnoses:  Vomiting  Sepsis, due to unspecified organism,  unspecified whether acute organ dysfunction present [facility])     MEDICATIONS GIVEN DURING THIS VISIT:  Medications  sodium chloride 0.9 % bolus 1,000 mL (1,000 mLs Intravenous Not Given 02/21/21 1505)  potassium chloride 10 mEq in 100 mL IVPB (10 mEq Intravenous New Bag/Given 02/21/21 1729)  lactated ringers infusion ( Intravenous New Bag/Given 02/21/21 1731)  ondansetron (ZOFRAN) injection 4 mg (4 mg Intravenous Given 02/21/21 1441)  lactated ringers bolus 1,000 mL (0 mLs Intravenous Stopped 02/21/21 1515)    And  lactated ringers bolus 1,000 mL (0 mLs Intravenous Stopped 02/21/21 1515)  ceFEPIme (MAXIPIME) 2 g in sodium chloride 0.9 % 100 mL IVPB (0 g Intravenous Stopped 02/21/21 1510)  metroNIDAZOLE (FLAGYL) IVPB 500 mg (0 mg Intravenous Stopped 02/21/21 1547)  vancomycin (VANCOREADY) IVPB 1500 mg/300 mL (1,500 mg Intravenous New Bag/Given 02/21/21 1602)  acetaminophen (TYLENOL) suppository 650 mg (650 mg Rectal Given 02/21/21 1437)  iohexol (OMNIPAQUE) 300 MG/ML solution 100 mL (100 mLs Intravenous Contrast Given 02/21/21 1542)  adenosine (ADENOCARD) 6 MG/2ML injection 6 mg (6 mg Intravenous Given 02/21/21 1758)  adenosine (ADENOCARD) 12 MG/4ML injection (12 mg  Given 02/21/21 1801)  diltiazem (CARDIZEM) injection 10 mg (10 mg Intravenous Given 02/21/21 1812)     ED Discharge Orders    None       Note:  This document was prepared using Dragon voice recognition software and may include unintentional dictation errors.   Duffy Bruce, MD 02/21/21 [id number]

## 2021-02-21 NOTE — Consult Note (Signed)
PHARMACY -  BRIEF ANTIBIOTIC NOTE   Pharmacy has received consult(s) for cefepime and vancomycin from an ED provider.  The patient's profile has been reviewed for ht/wt/allergies/indication/available labs.    One time order(s) placed for   Cefepime 2 gram  Vancomycin 1500 mg   Further antibiotics/pharmacy consults should be ordered by admitting physician if indicated.                       Thank you, Dorothe Pea, PharmD, BCPS 02/21/2021  2:07 PM

## 2021-02-21 NOTE — ED Notes (Signed)
SVT on the monitor, heart rate 160. Dr Archie Balboa still present at bedside. Verbal order for 10mg  of diltiazem given, will administer and continue to evaluate.

## 2021-02-22 ENCOUNTER — Inpatient Hospital Stay (HOSPITAL_COMMUNITY): Payer: Medicare Other

## 2021-02-22 ENCOUNTER — Inpatient Hospital Stay (HOSPITAL_COMMUNITY)
Admission: AD | Admit: 2021-02-22 | Discharge: 2021-02-28 | DRG: 871 | Disposition: A | Discharge status: Other Acute Inpatient Hospital | Payer: Medicare Other | Source: Other Acute Inpatient Hospital | Attending: Internal Medicine | Admitting: Internal Medicine

## 2021-02-22 ENCOUNTER — Encounter (HOSPITAL_COMMUNITY): Payer: Self-pay | Admitting: Internal Medicine

## 2021-02-22 ENCOUNTER — Inpatient Hospital Stay (HOSPITAL_COMMUNITY): Payer: Medicare Other | Admitting: Anesthesiology

## 2021-02-22 ENCOUNTER — Encounter (HOSPITAL_COMMUNITY)
Admission: AD | Disposition: A | Discharge status: Nursing Home (Includes ICF) | Payer: Self-pay | Source: Other Acute Inpatient Hospital | Attending: Internal Medicine

## 2021-02-22 DIAGNOSIS — I471 Supraventricular tachycardia: Secondary | ICD-10-CM | POA: Diagnosis present

## 2021-02-22 DIAGNOSIS — R6520 Severe sepsis without septic shock: Secondary | ICD-10-CM

## 2021-02-22 DIAGNOSIS — F0280 Dementia in other diseases classified elsewhere without behavioral disturbance: Secondary | ICD-10-CM | POA: Diagnosis present

## 2021-02-22 DIAGNOSIS — R1310 Dysphagia, unspecified: Secondary | ICD-10-CM | POA: Diagnosis present

## 2021-02-22 DIAGNOSIS — K8043 Calculus of bile duct with acute cholecystitis with obstruction: Secondary | ICD-10-CM | POA: Diagnosis not present

## 2021-02-22 DIAGNOSIS — R9341 Abnormal radiologic findings on diagnostic imaging of renal pelvis, ureter, or bladder: Secondary | ICD-10-CM | POA: Diagnosis present

## 2021-02-22 DIAGNOSIS — F0150 Vascular dementia without behavioral disturbance: Secondary | ICD-10-CM | POA: Diagnosis present

## 2021-02-22 DIAGNOSIS — I480 Paroxysmal atrial fibrillation: Secondary | ICD-10-CM | POA: Diagnosis present

## 2021-02-22 DIAGNOSIS — E119 Type 2 diabetes mellitus without complications: Secondary | ICD-10-CM | POA: Diagnosis not present

## 2021-02-22 DIAGNOSIS — F05 Delirium due to known physiological condition: Secondary | ICD-10-CM | POA: Diagnosis not present

## 2021-02-22 DIAGNOSIS — Z20822 Contact with and (suspected) exposure to covid-19: Secondary | ICD-10-CM | POA: Diagnosis not present

## 2021-02-22 DIAGNOSIS — E872 Acidosis: Secondary | ICD-10-CM | POA: Diagnosis not present

## 2021-02-22 DIAGNOSIS — G4733 Obstructive sleep apnea (adult) (pediatric): Secondary | ICD-10-CM | POA: Diagnosis present

## 2021-02-22 DIAGNOSIS — E876 Hypokalemia: Secondary | ICD-10-CM | POA: Diagnosis present

## 2021-02-22 DIAGNOSIS — K8063 Calculus of gallbladder and bile duct with acute cholecystitis with obstruction: Secondary | ICD-10-CM | POA: Diagnosis not present

## 2021-02-22 DIAGNOSIS — E785 Hyperlipidemia, unspecified: Secondary | ICD-10-CM | POA: Diagnosis present

## 2021-02-22 DIAGNOSIS — B9620 Unspecified Escherichia coli [E. coli] as the cause of diseases classified elsewhere: Secondary | ICD-10-CM

## 2021-02-22 DIAGNOSIS — R7881 Bacteremia: Secondary | ICD-10-CM | POA: Diagnosis not present

## 2021-02-22 DIAGNOSIS — Z9889 Other specified postprocedural states: Secondary | ICD-10-CM | POA: Diagnosis not present

## 2021-02-22 DIAGNOSIS — K5900 Constipation, unspecified: Secondary | ICD-10-CM | POA: Diagnosis present

## 2021-02-22 DIAGNOSIS — Z833 Family history of diabetes mellitus: Secondary | ICD-10-CM

## 2021-02-22 DIAGNOSIS — K851 Biliary acute pancreatitis without necrosis or infection: Secondary | ICD-10-CM

## 2021-02-22 DIAGNOSIS — K8309 Other cholangitis: Secondary | ICD-10-CM

## 2021-02-22 DIAGNOSIS — K828 Other specified diseases of gallbladder: Secondary | ICD-10-CM | POA: Diagnosis present

## 2021-02-22 DIAGNOSIS — G9340 Encephalopathy, unspecified: Secondary | ICD-10-CM | POA: Diagnosis not present

## 2021-02-22 DIAGNOSIS — A419 Sepsis, unspecified organism: Secondary | ICD-10-CM | POA: Diagnosis not present

## 2021-02-22 DIAGNOSIS — I10 Essential (primary) hypertension: Secondary | ICD-10-CM | POA: Diagnosis present

## 2021-02-22 DIAGNOSIS — A4151 Sepsis due to Escherichia coli [E. coli]: Secondary | ICD-10-CM

## 2021-02-22 DIAGNOSIS — R7401 Elevation of levels of liver transaminase levels: Secondary | ICD-10-CM | POA: Diagnosis not present

## 2021-02-22 DIAGNOSIS — Z66 Do not resuscitate: Secondary | ICD-10-CM | POA: Diagnosis not present

## 2021-02-22 DIAGNOSIS — K4090 Unilateral inguinal hernia, without obstruction or gangrene, not specified as recurrent: Secondary | ICD-10-CM | POA: Diagnosis present

## 2021-02-22 DIAGNOSIS — G9341 Metabolic encephalopathy: Secondary | ICD-10-CM | POA: Diagnosis not present

## 2021-02-22 DIAGNOSIS — T85528A Displacement of other gastrointestinal prosthetic devices, implants and grafts, initial encounter: Secondary | ICD-10-CM

## 2021-02-22 HISTORY — DX: Biliary acute pancreatitis without necrosis or infection: K85.10

## 2021-02-22 HISTORY — PX: REMOVAL OF STONES: SHX5545

## 2021-02-22 HISTORY — PX: SPHINCTEROTOMY: SHX5279

## 2021-02-22 HISTORY — DX: Bacteremia: R78.81

## 2021-02-22 HISTORY — DX: Other cholangitis: K83.09

## 2021-02-22 HISTORY — DX: Unspecified Escherichia coli (E. coli) as the cause of diseases classified elsewhere: B96.20

## 2021-02-22 HISTORY — PX: PANCREATIC STENT PLACEMENT: SHX5539

## 2021-02-22 HISTORY — DX: Sepsis due to Escherichia coli (e. coli): A41.51

## 2021-02-22 HISTORY — DX: Calculus of bile duct with acute cholecystitis with obstruction: K80.43

## 2021-02-22 HISTORY — PX: ERCP: SHX5425

## 2021-02-22 LAB — BLOOD CULTURE ID PANEL (REFLEXED) - BCID2

## 2021-02-22 LAB — CBC
HCT: 35.4 % — ABNORMAL LOW (ref 39.0–52.0)
Hemoglobin: 11.6 g/dL — ABNORMAL LOW (ref 13.0–17.0)
MCH: 29.4 pg (ref 26.0–34.0)
MCHC: 32.8 g/dL (ref 30.0–36.0)
MCV: 89.8 fL (ref 80.0–100.0)
Platelets: 171 10*3/uL (ref 150–400)
RBC: 3.94 MIL/uL — ABNORMAL LOW (ref 4.22–5.81)
RDW: 14.4 % (ref 11.5–15.5)
WBC: 6.7 10*3/uL (ref 4.0–10.5)
nRBC: 0 % (ref 0.0–0.2)

## 2021-02-22 LAB — COMPREHENSIVE METABOLIC PANEL
ALT: 105 U/L — ABNORMAL HIGH (ref 0–44)
AST: 78 U/L — ABNORMAL HIGH (ref 15–41)
Albumin: 2.7 g/dL — ABNORMAL LOW (ref 3.5–5.0)
Alkaline Phosphatase: 128 U/L — ABNORMAL HIGH (ref 38–126)
Anion gap: 6 (ref 5–15)
BUN: 11 mg/dL (ref 8–23)
CO2: 25 mmol/L (ref 22–32)
Calcium: 8.1 mg/dL — ABNORMAL LOW (ref 8.9–10.3)
Chloride: 105 mmol/L (ref 98–111)
Creatinine, Ser: 0.75 mg/dL (ref 0.61–1.24)
GFR, Estimated: >60 mL/min (ref >60)
Glucose, Bld: 126 mg/dL — ABNORMAL HIGH (ref 70–99)
Potassium: 3.1 mmol/L — ABNORMAL LOW (ref 3.5–5.1)
Sodium: 136 mmol/L (ref 135–145)
Total Bilirubin: 7.7 mg/dL — ABNORMAL HIGH (ref 0.3–1.2)
Total Protein: 5.2 g/dL — ABNORMAL LOW (ref 6.5–8.1)

## 2021-02-22 LAB — LACTIC ACID, PLASMA
Lactic Acid, Venous: 1.3 mmol/L (ref 0.5–1.9)
Lactic Acid, Venous: 1.2 mmol/L (ref 0.5–1.9)

## 2021-02-22 LAB — GLUCOSE, CAPILLARY
Glucose-Capillary: 107 mg/dL — ABNORMAL HIGH (ref 70–99)
Glucose-Capillary: 122 mg/dL — ABNORMAL HIGH (ref 70–99)
Glucose-Capillary: 82 mg/dL (ref 70–99)
Glucose-Capillary: 111 mg/dL — ABNORMAL HIGH (ref 70–99)
Glucose-Capillary: 163 mg/dL — ABNORMAL HIGH (ref 70–99)
Glucose-Capillary: 148 mg/dL — ABNORMAL HIGH (ref 70–99)

## 2021-02-22 LAB — MAGNESIUM: Magnesium: 2 mg/dL (ref 1.7–2.4)

## 2021-02-22 LAB — MRSA PCR SCREENING: MRSA by PCR: NEGATIVE

## 2021-02-22 LAB — PROCALCITONIN: Procalcitonin: 21.76 ng/mL

## 2021-02-22 LAB — PHOSPHORUS: Phosphorus: 2.6 mg/dL (ref 2.5–4.6)

## 2021-02-22 SURGERY — ERCP, WITH INTERVENTION IF INDICATED
Anesthesia: General | Laterality: Left

## 2021-02-22 MED ORDER — ONDANSETRON HCL 4 MG/2ML IJ SOLN
4.0000 mg | Freq: Four times a day (QID) | INTRAMUSCULAR | Status: DC | PRN
Start: 2021-02-22 — End: 2021-03-01
  Administered 2021-02-26: 4 mg via INTRAVENOUS
  Filled 2021-02-22: qty 2

## 2021-02-22 MED ORDER — SODIUM CHLORIDE 0.9 % IV SOLN
2.0000 g | Freq: Every day | INTRAVENOUS | Status: DC
  Administered 2021-02-22 – 2021-02-25 (×4): 2 g via INTRAVENOUS
  Filled 2021-02-22 (×3): qty 2
  Filled 2021-02-22: qty 20

## 2021-02-22 MED ORDER — CIPROFLOXACIN IN D5W 400 MG/200ML IV SOLN
INTRAVENOUS | Status: AC
  Filled 2021-02-22: qty 200

## 2021-02-22 MED ORDER — INDOMETHACIN 50 MG RE SUPP
RECTAL | Status: AC
  Filled 2021-02-22: qty 2

## 2021-02-22 MED ORDER — PROPOFOL 10 MG/ML IV BOLUS
INTRAVENOUS | Status: DC | PRN
  Administered 2021-02-22: 50 mg via INTRAVENOUS

## 2021-02-22 MED ORDER — ROCURONIUM BROMIDE 10 MG/ML (PF) SYRINGE
PREFILLED_SYRINGE | INTRAVENOUS | Status: DC | PRN
  Administered 2021-02-22: 30 mg via INTRAVENOUS
  Administered 2021-02-22: 10 mg via INTRAVENOUS

## 2021-02-22 MED ORDER — POTASSIUM CHLORIDE 10 MEQ/100ML IV SOLN
10.0000 meq | Freq: Once | INTRAVENOUS | Status: AC
  Administered 2021-02-22: 10 meq via INTRAVENOUS

## 2021-02-22 MED ORDER — MORPHINE SULFATE (PF) 2 MG/ML IV SOLN: 2.0000 mg | INTRAVENOUS | Status: DC | PRN

## 2021-02-22 MED ORDER — CIPROFLOXACIN IN D5W 400 MG/200ML IV SOLN
INTRAVENOUS | Status: DC | PRN
  Administered 2021-02-22: 400 mg via INTRAVENOUS

## 2021-02-22 MED ORDER — DILTIAZEM HCL 60 MG PO TABS
30.0000 mg | ORAL_TABLET | Freq: Four times a day (QID) | ORAL | Status: DC
  Administered 2021-02-22 – 2021-02-28 (×24): 30 mg via ORAL
  Filled 2021-02-22 (×23): qty 1

## 2021-02-22 MED ORDER — DEXAMETHASONE SODIUM PHOSPHATE 10 MG/ML IJ SOLN
INTRAMUSCULAR | Status: DC | PRN
  Administered 2021-02-22: 5 mg via INTRAVENOUS

## 2021-02-22 MED ORDER — SODIUM CHLORIDE 0.9 % IV SOLN
INTRAVENOUS | Status: DC | PRN
  Administered 2021-02-22: 25 mL

## 2021-02-22 MED ORDER — ONDANSETRON HCL 4 MG/2ML IJ SOLN
INTRAMUSCULAR | Status: DC | PRN
  Administered 2021-02-22: 4 mg via INTRAVENOUS

## 2021-02-22 MED ORDER — FENTANYL CITRATE (PF) 100 MCG/2ML IJ SOLN
INTRAMUSCULAR | Status: DC | PRN
  Administered 2021-02-22: 25 ug via INTRAVENOUS
  Administered 2021-02-22: 50 ug via INTRAVENOUS

## 2021-02-22 MED ORDER — DILTIAZEM HCL 25 MG/5ML IV SOLN
10.0000 mg | INTRAVENOUS | Status: DC | PRN
  Administered 2021-02-22: 10 mg via INTRAVENOUS
  Filled 2021-02-22 (×3): qty 5

## 2021-02-22 MED ORDER — INDOMETHACIN 50 MG RE SUPP
RECTAL | Status: DC | PRN
  Administered 2021-02-22: 100 mg via RECTAL

## 2021-02-22 MED ORDER — INSULIN ASPART 100 UNIT/ML ~~LOC~~ SOLN
0.0000 [IU] | SUBCUTANEOUS | Status: DC
  Administered 2021-02-22: 1 [IU] via SUBCUTANEOUS
  Administered 2021-02-22: 2 [IU] via SUBCUTANEOUS
  Administered 2021-02-22 – 2021-02-23 (×4): 1 [IU] via SUBCUTANEOUS
  Administered 2021-02-23 (×2): 2 [IU] via SUBCUTANEOUS
  Administered 2021-02-24 (×2): 1 [IU] via SUBCUTANEOUS
  Administered 2021-02-25 – 2021-02-26 (×3): 2 [IU] via SUBCUTANEOUS
  Administered 2021-02-26 (×2): 1 [IU] via SUBCUTANEOUS
  Administered 2021-02-26: 2 [IU] via SUBCUTANEOUS

## 2021-02-22 MED ORDER — POTASSIUM CHLORIDE 10 MEQ/100ML IV SOLN
10.0000 meq | INTRAVENOUS | Status: DC
  Administered 2021-02-22 (×3): 10 meq via INTRAVENOUS
  Filled 2021-02-22 (×4): qty 100

## 2021-02-22 MED ORDER — GLUCAGON HCL RDNA (DIAGNOSTIC) 1 MG IJ SOLR
INTRAMUSCULAR | Status: AC
  Filled 2021-02-22: qty 1

## 2021-02-22 MED ORDER — FENTANYL CITRATE (PF) 100 MCG/2ML IJ SOLN
INTRAMUSCULAR | Status: AC
  Filled 2021-02-22: qty 2

## 2021-02-22 MED ORDER — SUGAMMADEX SODIUM 200 MG/2ML IV SOLN
INTRAVENOUS | Status: DC | PRN
  Administered 2021-02-22: 140 mg via INTRAVENOUS

## 2021-02-22 MED ORDER — LACTATED RINGERS IV SOLN: INTRAVENOUS | Status: DC

## 2021-02-22 MED ORDER — ACETAMINOPHEN 650 MG RE SUPP: 650.0000 mg | Freq: Four times a day (QID) | RECTAL | Status: DC | PRN

## 2021-02-22 MED ORDER — ONDANSETRON HCL 4 MG PO TABS: 4.0000 mg | ORAL_TABLET | Freq: Four times a day (QID) | ORAL | Status: DC | PRN

## 2021-02-22 MED ORDER — PHENYLEPHRINE HCL-NACL 10-0.9 MG/250ML-% IV SOLN
INTRAVENOUS | Status: DC | PRN
  Administered 2021-02-22: 25 ug/min via INTRAVENOUS

## 2021-02-22 MED ORDER — ACETAMINOPHEN 325 MG PO TABS: 650.0000 mg | ORAL_TABLET | Freq: Four times a day (QID) | ORAL | Status: DC | PRN

## 2021-02-22 MED ORDER — INDOMETHACIN 50 MG RE SUPP
100.0000 mg | Freq: Once | RECTAL | Status: AC
  Administered 2021-02-22: 100 mg via RECTAL
  Filled 2021-02-22: qty 2

## 2021-02-22 MED ORDER — SODIUM CHLORIDE 0.9 % IV SOLN: 2.0000 g | INTRAVENOUS | Status: DC

## 2021-02-22 MED ORDER — LIDOCAINE 2% (20 MG/ML) 5 ML SYRINGE
INTRAMUSCULAR | Status: DC | PRN
  Administered 2021-02-22: 60 mg via INTRAVENOUS

## 2021-02-22 NOTE — Progress Notes (Signed)
PROGRESS NOTE    TREYON Lucero  FBP:[id number] DOB: 20-Apr-1928 DOA: 02/22/2021 PCP: Venia Carbon, MD     Brief Narrative:  Hector Lucero is a 87 y.o. WM PMHx Paroxysmal Atrial Fibrillation, SVT, HTN, DM type II diet controlled.  HLD, OSA,  Presents to ED at [facility] with c/o abd pain.  Onset 2 days ago, progressively worsening, diffuse, associated nausea.  Has had vomiting and constipation.  Also chills and generalized weakness, mod difficulty walking due to weakness.   ED Course: Pt found to have Tm 104.2, have ascending cholangitis with 78mm CBD stone on MRCP.  Started on cefepime, flagyl, vanc empirically (before he was found to have E.Coli bacteremia on culture that has since resulted).  Dr. Hilarie Fredrickson consulted (see note).  While in ED pt also apparently went into SVT, couldn't convert with adenosine so got multiple rounds of Cardizem which converted him to NSR.  Lipase over 1k.  Got 2L IVF bolus.  At this time pt denies any pain (specifically denies abd pain), says "I never did have any".  Unfortunately patient seems to have developed delirium since being in the ED yesterday afternoon.  Pt not oriented to location nor time.  Thinks he is "in a school".  Pt seems to be pleasantly confused at the moment.  Is redirectable.  Subjective: T-max overnight 40.1 C, patient just admitted at 0409 on 4/16 by [facility].  Currently obtaining ERCP.  No charge   Assessment & Plan: Covid vaccination;   Principal Problem:   Ascending cholangitis Active Problems:   Diabetes mellitus type 2, diet-controlled ([place])   Vascular dementia without behavioral disturbance ([place])   Choledocholithiasis with acute cholecystitis with obstruction   E coli bacteremia   PAF (paroxysmal atrial fibrillation) (HCC)   Acute gallstone pancreatitis   Abnormal radiologic findings on diagnostic imaging of renal pelvis, ureter, or bladder   Sepsis due to Escherichia coli (E. coli)  (HCC)  Choledocholithiasis with acute cholecystitis with obstruction -21mm CBD stone requiring ERCP  Sepsis - Upon admission patient does not meet sepsis criteria.  Bacteremia positive E. coli - Continue 2-week course antibiotics  Lactic acidosis -Resolved  Delirium   Hypokalemia - Potassium goal> 4 - 4/16 potassium IV 60 mEq    DVT prophylaxis: SCD Code Status: DNR Family Communication:  Status is: Inpatient    Dispo: The patient is from: Home              Anticipated d/c is to: Home              Anticipated d/c date is: 4/20              Patient currently unstable      Consultants:  Dr. Hilarie Fredrickson GI  Procedures/Significant Events:  4/16 ERCP pending   I have personally reviewed and interpreted all radiology studies and my findings are as above.  VENTILATOR SETTINGS:    Cultures 4/15 blood LEFT wrist positive E. coli 4/15 blood LEFT AC positive E. coli  Antimicrobials: Anti-infectives (From admission, onward)   Start     Ordered Stop   02/22/21 0400  cefTRIAXone (ROCEPHIN) 2 g in sodium chloride 0.9 % 100 mL IVPB        02/22/21 0354         Devices    LINES / TUBES:      Continuous Infusions: . cefTRIAXone (ROCEPHIN)  IV 2 g (02/22/21 0527)  . lactated ringers 125 mL/hr at 02/22/21 0414  Objective: Vitals:   02/22/21 0216 02/22/21 0301 02/22/21 0710  BP: 112/72 103/61 99/61  Pulse: 80  88  Resp: 16  20  Temp: 97.8 F (36.6 C)  98.1 F (36.7 C)  TempSrc: Oral  Oral  SpO2: 93%  100%  Weight: 70.4 kg    Height: 5\' 8"  (1.727 m)      Intake/Output Summary (Last 24 hours) at 02/22/2021 0746 Last data filed at 02/22/2021 0414 Gross per 24 hour  Intake 9.06 ml  Output --  Net 9.06 ml   Filed Weights   02/22/21 0216  Weight: 70.4 kg    Examination:  No charge .     Data Reviewed: Care during the described time interval was provided by me .  I have reviewed this patient's available data, including medical history,  events of note, physical examination, and all test results as part of my evaluation.  CBC: Recent Labs  Lab 02/21/21 1351 02/22/21 0451  WBC 5.4 6.7  NEUTROABS 4.9  --   HGB 12.5* 11.6*  HCT 38.1* 35.4*  MCV 86.8 89.8  PLT 189 101   Basic Metabolic Panel: Recent Labs  Lab 02/21/21 1351 02/22/21 0451  NA 135 136  K 2.9* 3.1*  CL 100 105  CO2 23 25  GLUCOSE 169* 126*  BUN 20 11  CREATININE 0.52* 0.75  CALCIUM 8.9 8.1*   GFR: Estimated Creatinine Clearance: 58.2 mL/min (by C-G formula based on SCr of 0.75 mg/dL). Liver Function Tests: Recent Labs  Lab 02/21/21 1351 02/22/21 0451  AST 142* 78*  ALT 156* 105*  ALKPHOS 176* 128*  BILITOT 8.6* 7.7*  PROT 7.0 5.2*  ALBUMIN 3.9 2.7*   Recent Labs  Lab 02/21/21 1351  LIPASE 1,247*   No results for input(s): AMMONIA in the last 168 hours. Coagulation Profile: Recent Labs  Lab 02/21/21 1410  INR 1.2   Cardiac Enzymes: No results for input(s): CKTOTAL, CKMB, CKMBINDEX, TROPONINI in the last 168 hours. BNP (last 3 results) No results for input(s): PROBNP in the last 8760 hours. HbA1C: No results for input(s): HGBA1C in the last 72 hours. CBG: Recent Labs  Lab 02/22/21 0409 02/22/21 0714  GLUCAP 107* 122*   Lipid Profile: No results for input(s): CHOL, HDL, LDLCALC, TRIG, CHOLHDL, LDLDIRECT in the last 72 hours. Thyroid Function Tests: No results for input(s): TSH, T4TOTAL, FREET4, T3FREE, THYROIDAB in the last 72 hours. Anemia Panel: No results for input(s): VITAMINB12, FOLATE, FERRITIN, TIBC, IRON, RETICCTPCT in the last 72 hours. Sepsis Labs: Recent Labs  Lab 02/21/21 1351 02/21/21 1732  LATICACIDVEN 2.2* 1.5    Recent Results (from the past 240 hour(s))  Resp Panel by RT-PCR (Flu A&B, Covid) Nasopharyngeal Swab     Status: None   Collection Time: 02/21/21  2:10 PM   Specimen: Nasopharyngeal Swab; Nasopharyngeal(NP) swabs in vial transport medium  Result Value Ref Range Status   SARS  Coronavirus 2 by RT PCR NEGATIVE NEGATIVE Final    Comment: (NOTE) SARS-CoV-2 target nucleic acids are NOT DETECTED.  The SARS-CoV-2 RNA is generally detectable in upper respiratory specimens during the acute phase of infection. The lowest concentration of SARS-CoV-2 viral copies this assay can detect is 138 copies/mL. A negative result does not preclude SARS-Cov-2 infection and should not be used as the sole basis for treatment or other patient management decisions. A negative result may occur with  improper specimen collection/handling, submission of specimen other than nasopharyngeal swab, presence of viral mutation(s) within the areas targeted by  this assay, and inadequate number of viral copies(<138 copies/mL). A negative result must be combined with clinical observations, patient history, and epidemiological information. The expected result is Negative.  Fact Sheet for Patients:  EntrepreneurPulse.com.au  Fact Sheet for Healthcare Providers:  IncredibleEmployment.be  This test is no t yet approved or cleared by the [place] FDA and  has been authorized for detection and/or diagnosis of SARS-CoV-2 by FDA under an Emergency Use Authorization (EUA). This EUA will remain  in effect (meaning this test can be used) for the duration of the COVID-19 declaration under Section 564(b)(1) of the Act, 21 U.S.C.section 360bbb-3(b)(1), unless the authorization is terminated  or revoked sooner.       Influenza A by PCR NEGATIVE NEGATIVE Final   Influenza B by PCR NEGATIVE NEGATIVE Final    Comment: (NOTE) The Xpert Xpress SARS-CoV-2/FLU/RSV plus assay is intended as an aid in the diagnosis of influenza from Nasopharyngeal swab specimens and should not be used as a sole basis for treatment. Nasal washings and aspirates are unacceptable for Xpert Xpress SARS-CoV-2/FLU/RSV testing.  Fact Sheet for  Patients: EntrepreneurPulse.com.au  Fact Sheet for Healthcare Providers: IncredibleEmployment.be  This test is not yet approved or cleared by the [place] FDA and has been authorized for detection and/or diagnosis of SARS-CoV-2 by FDA under an Emergency Use Authorization (EUA). This EUA will remain in effect (meaning this test can be used) for the duration of the COVID-19 declaration under Section 564(b)(1) of the Act, 21 U.S.C. section 360bbb-3(b)(1), unless the authorization is terminated or revoked.  Performed at [facility], [address] [place], [place] [address]   Blood Culture (routine x 2)     Status: None (Preliminary result)   Collection Time: 02/21/21  2:10 PM   Specimen: BLOOD  Result Value Ref Range Status   Specimen Description BLOOD LEFT ANTECUBITAL  Final   Special Requests   Final    BOTTLES DRAWN AEROBIC AND ANAEROBIC Blood Culture adequate volume   Culture  Setup Time   Final    Organism ID to follow Performed at [facility], [address] [place], [place] [address]    Culture PENDING  Incomplete   Report Status PENDING  Incomplete  Blood Culture ID Panel (Reflexed)     Status: Abnormal   Collection Time: 02/21/21  2:10 PM  Result Value Ref Range Status   Enterococcus faecalis NOT DETECTED NOT DETECTED Final   Enterococcus Faecium NOT DETECTED NOT DETECTED Final   Listeria monocytogenes NOT DETECTED NOT DETECTED Final   Staphylococcus species NOT DETECTED NOT DETECTED Final   Staphylococcus aureus (BCID) NOT DETECTED NOT DETECTED Final   Staphylococcus epidermidis NOT DETECTED NOT DETECTED Final   Staphylococcus lugdunensis NOT DETECTED NOT DETECTED Final   Streptococcus species NOT DETECTED NOT DETECTED Final   Streptococcus agalactiae NOT DETECTED NOT DETECTED Final   Streptococcus pneumoniae NOT DETECTED NOT DETECTED Final   Streptococcus pyogenes NOT DETECTED NOT DETECTED Final    A.calcoaceticus-baumannii NOT DETECTED NOT DETECTED Final   Bacteroides fragilis NOT DETECTED NOT DETECTED Final   Enterobacterales DETECTED (A) NOT DETECTED Final    Comment: Enterobacterales represent a large order of gram negative bacteria, not a single organism. CRITICAL RESULT CALLED TO, READ BACK BY AND VERIFIED WITH: Winfield Rast LEPLAT AT 5/02/22/22 MF    Enterobacter cloacae complex NOT DETECTED NOT DETECTED Final   Escherichia coli DETECTED (A) NOT DETECTED Final    Comment: CRITICAL RESULT CALLED TO, READ BACK BY AND VERIFIED WITH:  KISHAN LEPLAT AT 0016 02/22/21 MF    Klebsiella aerogenes NOT DETECTED NOT DETECTED Final   Klebsiella oxytoca NOT DETECTED NOT DETECTED Final   Klebsiella pneumoniae NOT DETECTED NOT DETECTED Final   Proteus species NOT DETECTED NOT DETECTED Final   Salmonella species NOT DETECTED NOT DETECTED Final   Serratia marcescens NOT DETECTED NOT DETECTED Final   Haemophilus influenzae NOT DETECTED NOT DETECTED Final   Neisseria meningitidis NOT DETECTED NOT DETECTED Final   Pseudomonas aeruginosa NOT DETECTED NOT DETECTED Final   Stenotrophomonas maltophilia NOT DETECTED NOT DETECTED Final   Candida albicans NOT DETECTED NOT DETECTED Final   Candida auris NOT DETECTED NOT DETECTED Final   Candida glabrata NOT DETECTED NOT DETECTED Final   Candida krusei NOT DETECTED NOT DETECTED Final   Candida parapsilosis NOT DETECTED NOT DETECTED Final   Candida tropicalis NOT DETECTED NOT DETECTED Final   Cryptococcus neoformans/gattii NOT DETECTED NOT DETECTED Final   CTX-M ESBL NOT DETECTED NOT DETECTED Final   Carbapenem resistance IMP NOT DETECTED NOT DETECTED Final   Carbapenem resistance KPC NOT DETECTED NOT DETECTED Final   Carbapenem resistance NDM NOT DETECTED NOT DETECTED Final   Carbapenem resist OXA 48 LIKE NOT DETECTED NOT DETECTED Final   Carbapenem resistance VIM NOT DETECTED NOT DETECTED Final    Comment: Performed at [facility], [address] [place], [place] [address]  MRSA PCR Screening     Status: None   Collection Time: 02/22/21  4:24 AM   Specimen: Nasopharyngeal  Result Value Ref Range Status   MRSA by PCR NEGATIVE NEGATIVE Final    Comment:        The GeneXpert MRSA Assay (FDA approved for NASAL specimens only), is one component of a comprehensive MRSA colonization surveillance program. It is not intended to diagnose MRSA infection nor to guide or monitor treatment for MRSA infections. Performed at [facility], [place] [address] [place], [place] [address]          Radiology Studies: CT ABDOMEN PELVIS W CONTRAST  Addendum Date: 02/21/2021   ADDENDUM REPORT: 02/21/2021 22:32 ADDENDUM: Please note findings and impression should mention suggestion of choledocholithiasis (2:36, 5:51). Electronically Signed   By: Iven Finn M.D.   On: 02/21/2021 22:32   Result Date: 02/21/2021 CLINICAL DATA:  Nonlocalized acute abdominal pain. EXAM: CT ABDOMEN AND PELVIS WITH CONTRAST TECHNIQUE: Multidetector CT imaging of the abdomen and pelvis was performed using the standard protocol following bolus administration of intravenous contrast. CONTRAST:  174mL OMNIPAQUE IOHEXOL 300 MG/ML  SOLN COMPARISON:  None. FINDINGS: Lower chest: Bilateral lower lobe airspace opacities. Limited evaluation due to respiratory motion artifact. Coronary artery calcifications. Hepatobiliary: There is a simple fluid density 2.2 cm lesion within the left hepatic lobe that likely represents a simple hepatic cyst (2:23). No focal liver abnormality. No gallstones. No pericholecystic fluid. Question irregular gallbladder wall thickening (5:29). Mild diffuse intrahepatic biliary ductal dilatation. Common bile duct is enlarged measuring up to 12 mm. Pancreas: No focal lesion. Normal pancreatic contour. No surrounding inflammatory changes. No main pancreatic ductal dilatation. Spleen: Normal in size without focal abnormality. Adrenals/Urinary  Tract: No adrenal nodule bilaterally. Bilateral kidneys enhance symmetrically. Subcentimeter hypodensities are too small to characterize. No hydronephrosis. No hydroureter. The urinary bladder is distended with urine. There is irregular urinary bladder wall thickening. Stomach/Bowel: Stomach is within normal limits. No evidence of bowel wall thickening or dilatation. Diffuse colonic diverticulosis. Appendix appears normal. Vascular/Lymphatic: No abdominal aorta or iliac aneurysm. Severe atherosclerotic  plaque of the aorta and its branches. No abdominal, pelvic, or inguinal lymphadenopathy. Reproductive: The prostate is enlarged measuring up to 5.2 cm. Median lobe hypertrophy is noted with associated mass effect on the posterior bladder wall. Other: No intraperitoneal free fluid. No intraperitoneal free gas. No organized fluid collection. Musculoskeletal: Left inguinal hernia containing fat and a short loop of small bowel. Diffusely decreased bone density. No suspicious lytic or blastic osseous lesions. No acute displaced fracture. Multilevel degenerative changes of the spine. Multilevel vertebral body height loss with chronic appearing compression fractures of the L1 and L3 vertebral bodies. IMPRESSION: 1. Bilateral lower lobe airspace opacities that could represent a combination of atelectasis and/or infection/inflammation. 2. Question irregular gallbladder wall thickening versus motion artifact. Associated common bile duct and intrahepatic biliary ductal dilatation. No pancreatic duct dilatation. Recommend right upper quadrant ultrasound for a more sensitive evaluation of the gallbladder. 3. Irregular urinary bladder wall thickening that may be related to obstructive uropathy in the setting of prostatomegaly; however, underlying infection or malignancy cannot be excluded. Recommend correlation with urinalysis and consideration of a urologic consultation. 4. Left inguinal hernia containing a short loop of small  bowel with no findings to suggest associated ischemia or bowel obstruction. 5. Diffuse colonic diverticulosis with no acute diverticulitis. 6. Diffusely decreased bone density with chronic appearing compression fractures of the L1-L3 vertebral bodies. 7.  Aortic Atherosclerosis (ICD10-I70.0). Electronically Signed: By: Iven Finn M.D. On: 02/21/2021 16:16   MR 3D Recon At Scanner  Result Date: 02/21/2021 CLINICAL DATA:  Concern for ductal stone EXAM: MRI ABDOMEN WITHOUT AND WITH CONTRAST (INCLUDING MRCP) TECHNIQUE: Multiplanar multisequence MR imaging of the abdomen was performed both before and after the administration of intravenous contrast. Heavily T2-weighted images of the biliary and pancreatic ducts were obtained, and three-dimensional MRCP images were rendered by post processing. CONTRAST:  7.51mL GADAVIST GADOBUTROL 1 MMOL/ML IV SOLN COMPARISON:  CT and ultrasound 02/21/2021 FINDINGS: Lower chest: Consolidative opacity in the bilateral lower lungs could reflect a combination of airspace disease and atelectasis. Trace bilateral effusions. Mediastinum is grossly unremarkable with evaluation limited by extensive cardiac pulsation. Hepatobiliary: Intra and extrahepatic biliary ductal dilatation with a 9 mm filling defect/gallstone present in the distal common bile duct and layering biliary sludge (series 12, image 57 and series 3, image 24). Distended gallbladder with mild gallbladder wall thickening demonstrating some postcontrast enhancement suggestive of acute cholecystitis. Scattered T2 hyperintense cysts are present throughout the liver, largest in the left lobe measuring up to 2.1 cm in size towards the false form ligament, as seen on comparison CT and ultrasound as well. Additional tiny probable cysts throughout both lobes of the liver. Smooth liver surface contour. Normal intrinsic hepatic signal. Pancreas: Moderate pancreatic atrophy. No peripancreatic inflammation. No significant pancreatic  ductal dilatation. Spleen: Normal in size. No concerning splenic lesions. Small accessory splenule seen medially. Adrenals/Urinary Tract: Mild symmetric bilateral perinephric stranding, a nonspecific finding which may correlate with advanced age or decreased renal function. Small fluid attenuation cysts seen in both kidneys, largest on the right measuring up to 11 mm in size without concerning postcontrast enhancement no obstructive uropathy. No concerning renal mass. Stomach/Bowel: Small air-filled duodenal diverticula result in some signal dropout including in the region near the ligament of Treitz and more minimally towards the distal common bile duct though separate from the filling defects detailed above (3/25, 3/26). No abnormally thickened or conspicuous bowel. Scattered colonic diverticula Vascular/Lymphatic: Vascular calcification better assessed on CT imaging. No aneurysm or ectasia. No acute  or worrisome abnormalities of the vessels. Other: Some mild perinephric stranding in T2 signal, as described above. No free air. No organized abscess or collection. Musculoskeletal: Grossly normal marrow signal. Dextrocurvature of the thoracic spine with levocurvature of the lumbar spine. Multilevel compression deformities, better seen on the multiplanar reconstructions of the CT imaging. Normal cord signal within the imaged levels. IMPRESSION: 1. Intra and extrahepatic biliary ductal dilatation with a 9 mm filling defect/gallstone present in the distal common bile duct and small amount of biliary sludge in the gallbladder and biliary tree. 2. Distended gallbladder with mild gallbladder wall thickening demonstrating postcontrast enhancement suggestive of acute cholecystitis. 3. Small air-filled duodenal diverticula result in some signal dropout in the region near the ligament of Treitz and more minimally towards the distal common bile duct though separate from the filling defects detailed on CT imaging. 4.  Consolidative opacity in the bilateral lower lungs could reflect a combination of airspace disease and atelectasis. Trace bilateral effusions. These results were called by telephone at the time of interpretation on 02/21/2021 at 10:43 pm to provider [facility] , who verbally acknowledged these results. Electronically Signed   By: Lovena Le M.D.   On: 02/21/2021 22:43   DG Chest Port 1 View  Result Date: 02/21/2021 CLINICAL DATA:  Abdominal pain and nausea for 3 days EXAM: PORTABLE CHEST 1 VIEW COMPARISON:  06/29/2011 FINDINGS: Single frontal view of the chest demonstrates a stable cardiac silhouette. Continued atherosclerosis of the thoracic aorta. There is increased density in the right paratracheal region, likely vascular shadow. Follow-up PA and lateral views of the chest may be useful for confirmation. Bilateral interstitial prominence compatible with scarring. No airspace disease, effusion, or pneumothorax. No acute bony abnormalities. S shaped scoliosis of the thoracolumbar spine. IMPRESSION: 1. Increased density right paratracheal region most consistent with vascular shadow. PA and lateral views of the chest may be useful for follow-up. 2. No acute airspace disease. Electronically Signed   By: Randa Ngo M.D.   On: 02/21/2021 15:28   MR ABDOMEN MRCP W WO CONTAST  Result Date: 02/21/2021 CLINICAL DATA:  Concern for ductal stone EXAM: MRI ABDOMEN WITHOUT AND WITH CONTRAST (INCLUDING MRCP) TECHNIQUE: Multiplanar multisequence MR imaging of the abdomen was performed both before and after the administration of intravenous contrast. Heavily T2-weighted images of the biliary and pancreatic ducts were obtained, and three-dimensional MRCP images were rendered by post processing. CONTRAST:  7.74mL GADAVIST GADOBUTROL 1 MMOL/ML IV SOLN COMPARISON:  CT and ultrasound 02/21/2021 FINDINGS: Lower chest: Consolidative opacity in the bilateral lower lungs could reflect a combination of airspace disease and  atelectasis. Trace bilateral effusions. Mediastinum is grossly unremarkable with evaluation limited by extensive cardiac pulsation. Hepatobiliary: Intra and extrahepatic biliary ductal dilatation with a 9 mm filling defect/gallstone present in the distal common bile duct and layering biliary sludge (series 12, image 57 and series 3, image 24). Distended gallbladder with mild gallbladder wall thickening demonstrating some postcontrast enhancement suggestive of acute cholecystitis. Scattered T2 hyperintense cysts are present throughout the liver, largest in the left lobe measuring up to 2.1 cm in size towards the false form ligament, as seen on comparison CT and ultrasound as well. Additional tiny probable cysts throughout both lobes of the liver. Smooth liver surface contour. Normal intrinsic hepatic signal. Pancreas: Moderate pancreatic atrophy. No peripancreatic inflammation. No significant pancreatic ductal dilatation. Spleen: Normal in size. No concerning splenic lesions. Small accessory splenule seen medially. Adrenals/Urinary Tract: Mild symmetric bilateral perinephric stranding, a nonspecific finding which may correlate  with advanced age or decreased renal function. Small fluid attenuation cysts seen in both kidneys, largest on the right measuring up to 11 mm in size without concerning postcontrast enhancement no obstructive uropathy. No concerning renal mass. Stomach/Bowel: Small air-filled duodenal diverticula result in some signal dropout including in the region near the ligament of Treitz and more minimally towards the distal common bile duct though separate from the filling defects detailed above (3/25, 3/26). No abnormally thickened or conspicuous bowel. Scattered colonic diverticula Vascular/Lymphatic: Vascular calcification better assessed on CT imaging. No aneurysm or ectasia. No acute or worrisome abnormalities of the vessels. Other: Some mild perinephric stranding in T2 signal, as described above.  No free air. No organized abscess or collection. Musculoskeletal: Grossly normal marrow signal. Dextrocurvature of the thoracic spine with levocurvature of the lumbar spine. Multilevel compression deformities, better seen on the multiplanar reconstructions of the CT imaging. Normal cord signal within the imaged levels. IMPRESSION: 1. Intra and extrahepatic biliary ductal dilatation with a 9 mm filling defect/gallstone present in the distal common bile duct and small amount of biliary sludge in the gallbladder and biliary tree. 2. Distended gallbladder with mild gallbladder wall thickening demonstrating postcontrast enhancement suggestive of acute cholecystitis. 3. Small air-filled duodenal diverticula result in some signal dropout in the region near the ligament of Treitz and more minimally towards the distal common bile duct though separate from the filling defects detailed on CT imaging. 4. Consolidative opacity in the bilateral lower lungs could reflect a combination of airspace disease and atelectasis. Trace bilateral effusions. These results were called by telephone at the time of interpretation on 02/21/2021 at 10:43 pm to provider [facility] , who verbally acknowledged these results. Electronically Signed   By: Lovena Le M.D.   On: 02/21/2021 22:43   US Abdomen Limited RUQ (LIVER/GB)  Result Date: 02/21/2021 CLINICAL DATA:  Vomiting EXAM: ULTRASOUND ABDOMEN LIMITED RIGHT UPPER QUADRANT COMPARISON:  None. FINDINGS: Gallbladder: Mildly distended gallbladder. Slight gallbladder wall thickening measuring up to 5 mm. Small amount of sludge within the gallbladder. No visible stones. Negative sonographic Murphy's. Common bile duct: Diameter: Upper limits normal in diameter, 7 mm. Liver: 2.4 cm cyst in the left hepatic lobe. Normal echotexture. Portal vein is patent on color Doppler imaging with normal direction of blood flow towards the liver. Other: None. IMPRESSION: Mildly distended gallbladder with  slight gallbladder wall thickening and a small amount of sludge in the gallbladder. No visible stones or sonographic Murphy sign. Electronically Signed   By: Rolm Baptise M.D.   On: 02/21/2021 17:09        Scheduled Meds: . insulin aspart  0-9 Units Subcutaneous Q4H   Continuous Infusions: . cefTRIAXone (ROCEPHIN)  IV 2 g (02/22/21 0527)  . lactated ringers 125 mL/hr at 02/22/21 0414     LOS: 0 days    Time spent:5 min    Jodee Wagenaar, Geraldo Docker, MD [facility]   If 7PM-7AM, please contact night-coverage 02/22/2021, 7:46 AM

## 2021-02-22 NOTE — Consult Note (Signed)
Consultation  Referring Provider:  [facility]/ Sherral Hammers Primary Care Physician:  Venia Carbon, MD Primary Gastroenterologist:  none  Reason for Consultation:  cholangitis  HPI: Hector Lucero is a 87 y.o. male transferred from [facility] early this a.m. after he had presented there with 2-day history of progressive abdominal pain associated with nausea vomiting chills and weakness. In the ER found to have temp of 104 Noted to have elevated LFTs and then further imaging including MRI and MRCP revealed intra and extrabiliary ductal dilation with a 9 mm filling defect/gallstone in the distal common bile duct and layering biliary sludge, distended gallbladder with mild gallbladder wall thickening suggestive of acute cholecystitis, moderate pancreatic atrophy no peripancreatic inflammation, small air-filled duodenal diverticuli in the region of the ligament of Treitz, and some consolidative changes bilateral lower lungs trace bilateral effusions  He had been started on IV cefepime/Flagyl and vancomycin.   Blood cultures already positive for E. coli  Labs this a.m. WBC 6.7/hemoglobin 11.6/hematocrit 35.4 Potassium 3.1 BUN 11/creatinine 0.75 T bili 7.7/alk phos 128/AST 78/ALT 105 COVID-19 negative INR 1.2  Patient was in SVT last p.m., several doses of Cardizem and has been in sinus rhythm since.  He is afebrile this morning and has been hemodynamically stable.  Nurses report some confusion.  Patient is alert, denies any abdominal pain nausea or vomiting.  He knows he is in the hospital and seems to know he was transferred from [facility].  He is disoriented to time date etc.   Past Medical History:  Diagnosis Date  . Atrial fibrillation ([place])   . Diabetes mellitus without complication ([place])   . Fatigue   . HLD (hyperlipidemia)   . Hypertension   . OSA (obstructive sleep apnea)   . Radial fracture 2006   right, screws and plates  . SVT (supraventricular tachycardia) ([place]) 2012   a. Holter in  2012 showed 27 episodes of SVT/atrial tach w/ longest being 36 beats; b. asymptomatic  . Syncope 08/2015   a. short rhythm strip in [facility] ED showed junctional escape rhythm and AV dissociation with essentially sinus bradycardia  . Ulnar fracture 1995   fall off of ladder    Past Surgical History:  Procedure Laterality Date  . HERNIA REPAIR  Aug 2012   right indirect inguinal, with mesh    Prior to Admission medications   Medication Sig Start Date End Date Taking? Authorizing Provider  acetaminophen (TYLENOL) 500 MG tablet Take 1,000 mg by mouth every 4 (four) hours as needed for mild pain or fever.    [provider]  carboxymethylcellulose (ARTIFICIAL TEARS) 1 % ophthalmic solution Apply 1 drop to eye 2 (two) times daily as needed (dry eyes).    [provider]  Multiple Vitamins-Minerals (MENS 50+ MULTI VITAMIN/MIN PO) Take 1 tablet by mouth daily.    [provider]  vitamin B-12 (CYANOCOBALAMIN) 1000 MCG tablet Take 1,000 mcg by mouth daily.    [provider]    Current Facility-Administered Medications  Medication Dose Route Frequency Provider Last Rate Last Admin  . acetaminophen (TYLENOL) tablet 650 mg  650 mg Oral Q6H PRN Etta Quill, DO       Or  . acetaminophen (TYLENOL) suppository 650 mg  650 mg Rectal Q6H PRN Etta Quill, DO      . cefTRIAXone (ROCEPHIN) 2 g in sodium chloride 0.9 % 100 mL IVPB  2 g Intravenous Q0600 Jennette Kettle M, DO 200 mL/hr at 02/22/21 0527 2 g  at 02/22/21 0527  . insulin aspart (novoLOG) injection 0-9 Units  0-9 Units Subcutaneous Q4H Jennette Kettle M, DO      . lactated ringers infusion   Intravenous Continuous Etta Quill, DO 125 mL/hr at 02/22/21 0414 Infusion Verify at 02/22/21 0414  . morphine 2 MG/ML injection 2-4 mg  2-4 mg Intravenous Q4H PRN Etta Quill, DO      . ondansetron [facility]) tablet 4 mg  4 mg Oral Q6H PRN Etta Quill, DO       Or  . ondansetron [facility]) injection 4 mg   4 mg Intravenous Q6H PRN Etta Quill, DO      . potassium chloride 10 mEq in 100 mL IVPB  10 mEq Intravenous Q1 Hr x 5 Allie Bossier, MD        Allergies as of 02/21/2021  . (No Known Allergies)    Family History  Problem Relation Age of Onset  . Diabetes Mother     Social History   Socioeconomic History  . Marital status: Married    Spouse name: Not on file  . Number of children: 0  . Years of education: Not on file  . Highest education level: Not on file  Occupational History  . Occupation: Retired    Comment: Energy manager.  Tobacco Use  . Smoking status: Never Smoker  . Smokeless tobacco: Never Used  Substance and Sexual Activity  . Alcohol use: Yes    Alcohol/week: 3.0 standard drinks    Types: 3 Glasses of wine per week    Comment: occasional  . Drug use: No  . Sexual activity: Not Currently  Other Topics Concern  . Not on file  Social History Narrative   Exercises regularly 5 days weekly at the [employer]   4 degrees-- 2 Bachelor's, MBA and MA      Has living will   Wife is health care POA--- nephew [name]--is alternate   Has DNR already   No tube feeds if cognitively unaware   Social Determinants of Health   Financial Resource Strain: Not on file  Food Insecurity: Not on file  Transportation Needs: Not on file  Physical Activity: Not on file  Stress: Not on file  Social Connections: Not on file  Intimate Partner Violence: Not on file    Review of Systems: Pertinent positive and negative review of systems were noted in the above HPI section.  All other review of systems was otherwise negative.  Physical Exam: Vital signs in last 24 hours: Temp:  [97.8 F (36.6 C)-98.1 F (36.7 C)] 98.1 F (36.7 C) (04/16 0710) Pulse Rate:  [80-88] 88 (04/16 0710) Resp:  [16-20] 20 (04/16 0710) BP: (99-112)/(61-72) 99/61 (04/16 0710) SpO2:  [93 %-100 %] 100 % (04/16 0710) Weight:  [70.4 kg] 70.4 kg (04/16 0216) Last BM  Date: 02/21/21 General:   Alert,  Well-developed, well-nourished, pleasant and cooperative in NAD Head:  Normocephalic and atraumatic. Eyes:  Sclera icteric Ears:  Normal auditory acuity. Nose:  No deformity, discharge,  or lesions. Mouth:  No deformity or lesions.   Neck:  Supple; no masses or thyromegaly. Lungs:  Clear throughout to auscultation.   No wheezes, crackles, or rhonchi.  Heart:  Regular rate and rhythm; no murmurs, clicks, rubs,  or gallops. Abdomen:  Soft, mildly tender in the epigastrium and right upper quadrant no guarding or rebound, BS active,nonpalp mass or hsm.   Rectal: Not done Msk:  Symmetrical  without gross deformities. . Pulses:  Normal pulses noted. Extremities:  Without clubbing or edema. Neurologic:  Alert , disoriented grossly normal neurologically otherwise, and pleasant and cooperative Skin:  Intact without significant lesions or rashes.. Psych:  Alert and cooperative. Normal mood and affect.  Intake/Output from previous day: 04/15 0701 - 04/16 0700 In: 9.1 [I.V.:9.1] Out: -  Intake/Output this shift: No intake/output data recorded.  Lab Results: Recent Labs    02/21/21 1351 02/22/21 0451  WBC 5.4 6.7  HGB 12.5* 11.6*  HCT 38.1* 35.4*  PLT 189 171   BMET Recent Labs    02/21/21 1351 02/22/21 0451  NA 135 136  K 2.9* 3.1*  CL 100 105  CO2 23 25  GLUCOSE 169* 126*  BUN 20 11  CREATININE 0.52* 0.75  CALCIUM 8.9 8.1*   LFT Recent Labs    02/22/21 0451  PROT 5.2*  ALBUMIN 2.7*  AST 78*  ALT 105*  ALKPHOS 128*  BILITOT 7.7*   PT/INR Recent Labs    02/21/21 1410  LABPROT 15.0  INR 1.2   Hepatitis Panel No results for input(s): HEPBSAG, HCVAB, HEPAIGM, HEPBIGM in the last 72 hours.   IMPRESSION:  #48 87 year old white male with acute cholangitis/sepsis.  MRI/MRCP confirms distal CBD stone, distended gallbladder, and probable acute cholecystitis. Blood cultures positive for E. Coli  He is currently hemodynamically  stable, on broad-spectrum antibiotics  #2 hypokalemia-we will correct #3 history of atrial fibrillation #4 SVT secondary to sepsis resolved #5 sleep apnea #6.  Diabetes mellitus diet controlled  Plan; keep n.p.o. Patient will be scheduled for ERCP with stone extraction today with Dr. Watt Climes.  Procedure was discussed in detail with the patient, however he is not able to consent for himself at this time.  Will reach out to his spouse. Continue Rocephin Will order runs of potassium to correct potassium prior to sedation     Adya Wirz EsterwoodPA-C  02/22/2021, 8:25 AM

## 2021-02-22 NOTE — Consult Note (Signed)
PHARMACY - PHYSICIAN COMMUNICATION CRITICAL VALUE ALERT - BLOOD CULTURE IDENTIFICATION (BCID)  Hector Lucero is an 87 y.o. male who presented to [facility] on 02/21/2021 with a chief complaint of abdominal pain  Assessment:  4 out of 4 bottles positive for GNR. BCID positive for E.coli. No resistance noted.  Name of physician (or Provider) Contacted: Mansy and Archie Balboa  Current antibiotics: None  Changes to prescribed antibiotics recommended: Ceftriaxone 2 g q24H Recommendations accepted by provider  Results for orders placed or performed during the hospital encounter of 02/21/21  Blood Culture ID Panel (Reflexed) (Collected: 02/21/2021  2:10 PM)  Result Value Ref Range   Enterococcus faecalis NOT DETECTED NOT DETECTED   Enterococcus Faecium NOT DETECTED NOT DETECTED   Listeria monocytogenes NOT DETECTED NOT DETECTED   Staphylococcus species NOT DETECTED NOT DETECTED   Staphylococcus aureus (BCID) NOT DETECTED NOT DETECTED   Staphylococcus epidermidis NOT DETECTED NOT DETECTED   Staphylococcus lugdunensis NOT DETECTED NOT DETECTED   Streptococcus species NOT DETECTED NOT DETECTED   Streptococcus agalactiae NOT DETECTED NOT DETECTED   Streptococcus pneumoniae NOT DETECTED NOT DETECTED   Streptococcus pyogenes NOT DETECTED NOT DETECTED   A.calcoaceticus-baumannii NOT DETECTED NOT DETECTED   Bacteroides fragilis NOT DETECTED NOT DETECTED   Enterobacterales DETECTED (A) NOT DETECTED   Enterobacter cloacae complex NOT DETECTED NOT DETECTED   Escherichia coli DETECTED (A) NOT DETECTED   Klebsiella aerogenes NOT DETECTED NOT DETECTED   Klebsiella oxytoca NOT DETECTED NOT DETECTED   Klebsiella pneumoniae NOT DETECTED NOT DETECTED   Proteus species NOT DETECTED NOT DETECTED   Salmonella species NOT DETECTED NOT DETECTED   Serratia marcescens NOT DETECTED NOT DETECTED   Haemophilus influenzae NOT DETECTED NOT DETECTED   Neisseria meningitidis NOT DETECTED NOT DETECTED   Pseudomonas  aeruginosa NOT DETECTED NOT DETECTED   Stenotrophomonas maltophilia NOT DETECTED NOT DETECTED   Candida albicans NOT DETECTED NOT DETECTED   Candida auris NOT DETECTED NOT DETECTED   Candida glabrata NOT DETECTED NOT DETECTED   Candida krusei NOT DETECTED NOT DETECTED   Candida parapsilosis NOT DETECTED NOT DETECTED   Candida tropicalis NOT DETECTED NOT DETECTED   Cryptococcus neoformans/gattii NOT DETECTED NOT DETECTED   CTX-M ESBL NOT DETECTED NOT DETECTED   Carbapenem resistance IMP NOT DETECTED NOT DETECTED   Carbapenem resistance KPC NOT DETECTED NOT DETECTED   Carbapenem resistance NDM NOT DETECTED NOT DETECTED   Carbapenem resist OXA 48 LIKE NOT DETECTED NOT DETECTED   Carbapenem resistance VIM NOT DETECTED NOT DETECTED    [name] 02/22/2021  12:43 AM

## 2021-02-22 NOTE — Op Note (Signed)
[facility] Patient Name: Hector Lucero Procedure Date : 02/22/2021 MRN: [id number] Attending MD: Clarene Essex , MD Date of Birth: Jan 11, 1928 CSN: [id number] Age: 87 Admit Type: Inpatient Procedure:                ERCP Indications:              For therapy of bile duct stone(s), Suspected                            ascending cholangitis Providers:                Clarene Essex, MD, Baird Cancer, RN, Nelia Shi, RN, Fransico Setters Mbumina, Technician Referring MD:              Medicines:                General Anesthesia Complications:            No immediate complications. Estimated Blood Loss:     Estimated blood loss: none. Procedure:                Pre-Anesthesia Assessment:                           - Prior to the procedure, a History and Physical                            was performed, and patient medications and                            allergies were reviewed. The patient's tolerance of                            previous anesthesia was also reviewed. The risks                            and benefits of the procedure and the sedation                            options and risks were discussed with the patient.                            All questions were answered, and informed consent                            was obtained. Prior Anticoagulants: The patient has                            taken no previous anticoagulant or antiplatelet                            agents. ASA Grade Assessment: III - A patient with  severe systemic disease. After reviewing the risks                            and benefits, the patient was deemed in                            satisfactory condition to undergo the procedure.                           After obtaining informed consent, the scope was                            passed under direct vision. Throughout the                            procedure, the patient's blood  pressure, pulse, and                            oxygen saturations were monitored continuously. The                            [id number]-Q180V ([id number] Olympus duodenoscope was                            introduced through the mouth, and used to inject                            contrast into and used to locate the major papilla.                            The ERCP was technically difficult and complex due                            to abnormal anatomy. Successful completion of the                            procedure was aided by performing the maneuvers                            documented (below) in this report. The patient                            tolerated the procedure well. Scope In: Scope Out: Findings:      The major papilla was adjacent to a diverticulum. The major papilla was       edematous. We initially cannulated the pancreatic duct with the wire and       we tried to reposition the sphincterotome but the wire repeatedly went       towards the pancreas so we elected to place one 4 Fr by 5 cm plastic       pancreatic stent with a 3/4 external pigtail and no internal flaps was       placed 4 cm into the ventral pancreatic duct. We used a longer stent       because  of the long ampullary segment based on the diverticuli and the       stent was in good position. We then tried to cannulate next to the stent       in the customary fashion but could not advance the wire but when we were       not pointing towards the pancreas we did try A biliary pre-cut       sphincterotomy measuring 4 mm in length was made with a Hydratome       sphincterotome using ERBE electrocautery. There was no       post-sphincterotomy bleeding. We were still unable to cannulate so we       proceeded with A biliary pre-cut sphincterotomy measuring 3 mm in length       was made with a needle knife using a freehand technique using ERBE       electrocautery into the segment that we thought was the bile duct which        was successful in obtaining deep selective cannulation. And on initial       cholangiogram obvious stones were seen and there was no       post-sphincterotomy bleeding. We then exchanged the needle-knife for the       conventional sphincterotome and the biliary sphincterotomy was extended       to a total of 5 mm in length with a Hydratome sphincterotome using ERBE       electrocautery. There was no post-sphincterotomy bleeding. We could get       the fully bowed sphincterotome easily in and out of the duct and to       discover objects, the biliary tree was swept with an adjustable 12- 15       mm balloon starting at the bifurcation. Sludge was swept from the duct.       All stones were removed. Nothing was found on subsequent balloon       pull-through's using both sizes of balloons and a negative occlusion       cholangiogram was done at the end of the procedure and there was       adequate biliary drainage and the patient tolerated the procedure well. Impression:               - The major papilla was adjacent to a diverticulum.                           - The major papilla appeared edematous.                           - Choledocholithiasis was found. Complete removal                            was accomplished by biliary sphincterotomy and                            balloon extraction.                           - One plastic pancreatic stent was placed into the                            ventral pancreatic duct. But  no dye was injected                            into the pancreas                           - A precut biliary sphincterotomy was performed.                           - A needle-knife biliary sphincterotomy was                            performed.                           - A biliary sphincterotomy extension was performed.                           - The biliary tree was swept at the end of the                            procedure and nothing was  found. Recommendation:           - Clear liquid diet today.                           - Continue present medications.                           - Return to GI clinic PRN.                           - Telephone GI clinic if symptomatic PRN.                           - Check liver enzymes (AST, ALT, alkaline                            phosphatase, bilirubin) and hemoglobin in the                            morning. Procedure Code(s):        --- Professional ---                           [phone], Esophagogastroduodenoscopy, flexible,                            transoral; diagnostic, including collection of                            specimen(s) by brushing or washing, when performed                            (separate procedure) Diagnosis Code(s):        --- Professional ---  K80.50, Calculus of bile duct without cholangitis                            or cholecystitis without obstruction                           K83.8, Other specified diseases of biliary tract CPT copyright 2019 American Medical Association. All rights reserved. The codes documented in this report are preliminary and upon coder review may  be revised to meet current compliance requirements. Clarene Essex, MD 02/22/2021 1:58:16 PM This report has been signed electronically. Number of Addenda: 0

## 2021-02-22 NOTE — Plan of Care (Signed)

## 2021-02-22 NOTE — Progress Notes (Addendum)
Overnight floor coverage progress note  Patient tachycardic with heart rate in the 150s to 160s, rhythm SVT.  He is asymptomatic.  Blood pressure 135/83. Chart reviewed.  He does have a history of SVT.  Yesterday in the ED he went into SVT which was resistant to adenosine but did convert back to normal rhythm after he was given doses of IV Cardizem. -Give IV Cardizem -Check potassium, magnesium, TSH levels  Addendum/update: Converted to sinus rhythm after IV Cardizem 10 mg.  SVT likely precipitated by infection (admitted for cholangitis).  Discussed with Dr. Radford Pax from cardiology, recommends starting p.o. short acting Cardizem for now with plan to transition to long-acting Cardizem at the time of discharge.  Start p.o. Cardizem 30 mg every 6 hours.

## 2021-02-22 NOTE — Transfer of Care (Signed)
Immediate Anesthesia Transfer of Care Note  Patient: Hector Lucero  Procedure(s) Performed: ENDOSCOPIC RETROGRADE CHOLANGIOPANCREATOGRAPHY (ERCP) (Left ) SPHINCTEROTOMY REMOVAL OF STONES PANCREATIC STENT PLACEMENT  Patient Location: Endoscopy Unit  Anesthesia Type:General  Level of Consciousness: drowsy and patient cooperative  Airway & Oxygen Therapy: Patient Spontanous Breathing and Patient connected to nasal cannula oxygen  Post-op Assessment: Report given to RN  Post vital signs: Reviewed and stable  Last Vitals:  Vitals Value Taken Time  BP    Temp    Pulse    Resp    SpO2      Last Pain:  Vitals:   02/22/21 1154  TempSrc: Oral  PainSc: 0-No pain         Complications: No complications documented.

## 2021-02-22 NOTE — ED Notes (Signed)
Report given to [facility]. 

## 2021-02-22 NOTE — Progress Notes (Signed)
Tele called pt HR 150s. Pt showing no signs or symptoms. Notified provider.

## 2021-02-22 NOTE — Anesthesia Preprocedure Evaluation (Addendum)
Anesthesia Evaluation  Patient identified by MRN, date of birth, ID band Patient confused    Reviewed: Allergy & Precautions, H&P , NPO status , Patient's Chart, lab work & pertinent test results  Airway Mallampati: II  TM Distance: >3 FB Neck ROM: Full    Dental no notable dental hx. (+) Teeth Intact, Dental Advisory Given   Pulmonary sleep apnea ,    Pulmonary exam normal breath sounds clear to auscultation       Cardiovascular hypertension, + dysrhythmias Atrial Fibrillation and Supra Ventricular Tachycardia  Rhythm:Regular Rate:Normal     Neuro/Psych Dementia negative neurological ROS     GI/Hepatic negative GI ROS, Neg liver ROS,   Endo/Other  diabetes  Renal/GU negative Renal ROS  negative genitourinary   Musculoskeletal   Abdominal   Peds  Hematology negative hematology ROS (+)   Anesthesia Other Findings   Reproductive/Obstetrics negative OB ROS                           Anesthesia Physical Anesthesia Plan  ASA: III  Anesthesia Plan: General   Post-op Pain Management:    Induction: Intravenous  PONV Risk Score and Plan: 2 and Ondansetron and Dexamethasone  Airway Management Planned: Oral ETT  Additional Equipment:   Intra-op Plan:   Post-operative Plan: Extubation in OR  Informed Consent: I have reviewed the patients History and Physical, chart, labs and discussed the procedure including the risks, benefits and alternatives for the proposed anesthesia with the patient or authorized representative who has indicated his/her understanding and acceptance.   Patient has DNR.  Discussed DNR with power of attorney and Suspend DNR.   Dental advisory given and Consent reviewed with POA  Plan Discussed with: CRNA  Anesthesia Plan Comments:        Anesthesia Quick Evaluation

## 2021-02-22 NOTE — Progress Notes (Signed)
Hector Lucero 11:51 AM  Subjective: Patient seen and examined in hospital computer chart reviewed and case discussed with the other GI team and currently the patient has no complaints and we discussed the procedure with him  Objective: Vital signs stable afebrile no acute distress exam please see preassessment evaluation CBC okay LFTs increased MRCP compatible with CBD stone which was reviewed  Assessment: CBD stone and cholangitis  Plan: The other GI team obtain consent from the power of attorney in [place] for ERCP possible stone extraction possible stent and we will proceed today with anesthesia assistance  [facility]  office [phone] After 5PM or if no answer call [phone]

## 2021-02-22 NOTE — Anesthesia Procedure Notes (Signed)
Procedure Name: Intubation Date/Time: 02/22/2021 12:14 PM Performed by: Barrington Ellison, CRNA Pre-anesthesia Checklist: Patient identified, Emergency Drugs available, Suction available and Patient being monitored Patient Re-evaluated:Patient Re-evaluated prior to induction Oxygen Delivery Method: Circle System Utilized Preoxygenation: Pre-oxygenation with 100% oxygen Induction Type: IV induction Ventilation: Mask ventilation without difficulty Laryngoscope Size: Mac and 4 Grade View: Grade I Tube type: Oral Number of attempts: 1 Airway Equipment and Method: Stylet and Oral airway Placement Confirmation: ETT inserted through vocal cords under direct vision,  positive ETCO2 and breath sounds checked- equal and bilateral Secured at: 22 cm Tube secured with: Tape Dental Injury: Teeth and Oropharynx as per pre-operative assessment

## 2021-02-22 NOTE — ED Notes (Signed)
EMTALA reviewed by this RN.  

## 2021-02-22 NOTE — Anesthesia Postprocedure Evaluation (Signed)
Anesthesia Post Note  Patient: Hector Lucero  Procedure(s) Performed: ENDOSCOPIC RETROGRADE CHOLANGIOPANCREATOGRAPHY (ERCP) (Left ) SPHINCTEROTOMY REMOVAL OF STONES PANCREATIC STENT PLACEMENT     Patient location during evaluation: PACU Anesthesia Type: General Level of consciousness: awake and alert Pain management: pain level controlled Vital Signs Assessment: post-procedure vital signs reviewed and stable Respiratory status: spontaneous breathing, nonlabored ventilation, respiratory function stable and patient connected to nasal cannula oxygen Cardiovascular status: blood pressure returned to baseline and stable Postop Assessment: no apparent nausea or vomiting Anesthetic complications: no   No complications documented.  Last Vitals:  Vitals:   02/22/21 1350 02/22/21 1400  BP: 127/67 115/67  Pulse: 84 78  Resp: 20 15  Temp: (!) 36.4 C   SpO2: 93% 91%    Last Pain:  Vitals:   02/22/21 1400  TempSrc:   PainSc: 0-No pain                 Emad Brechtel,W. EDMOND

## 2021-02-22 NOTE — H&P (Signed)
History and Physical    Hector Lucero TDV:[id number] DOB: 07-20-1928 DOA: 02/22/2021  PCP: Venia Carbon, MD  Patient coming from: [facility]  I have personally briefly reviewed patient's old medical records in [place]  Chief Complaint: Abd pain  HPI: Hector Lucero is a 87 y.o. male with medical history significant of PAF, SVT, HTN, DM diet controlled.  Pt presents to ED at [facility] with c/o abd pain.  Onset 2 days ago, progressively worsening, diffuse, associated nausea.  Has had vomiting and constipation.  Also chills and generalized weakness, mod difficulty walking due to weakness.   ED Course: Pt found to have Tm 104.2, have ascending cholangitis with 57mm CBD stone on MRCP.  Started on cefepime, flagyl, vanc empirically (before he was found to have E.Coli bacteremia on culture that has since resulted).  Dr. Hilarie Fredrickson consulted (see note).  While in ED pt also apparently went into SVT, couldn't convert with adenosine so got multiple rounds of Cardizem which converted him to NSR.  Lipase over 1k.  Got 2L IVF bolus.  At this time pt denies any pain (specifically denies abd pain), says "I never did have any".  Unfortunately patient seems to have developed delirium since being in the ED yesterday afternoon.  Pt not oriented to location nor time.  Thinks he is "in a school".  Pt seems to be pleasantly confused at the moment.  Is redirectable.  Review of Systems: Not really able to perform due to AMS / delirium.  Past Medical History:  Diagnosis Date  . Atrial fibrillation ([place])   . Diabetes mellitus without complication ([place])   . Fatigue   . HLD (hyperlipidemia)   . Hypertension   . OSA (obstructive sleep apnea)   . Radial fracture 2006   right, screws and plates  . SVT (supraventricular tachycardia) ([place]) 2012   a. Holter in 2012 showed 27 episodes of SVT/atrial tach w/ longest being 36 beats; b. asymptomatic  . Syncope 08/2015   a. short rhythm strip in [facility] ED  showed junctional escape rhythm and AV dissociation with essentially sinus bradycardia  . Ulnar fracture 1995   fall off of ladder    Past Surgical History:  Procedure Laterality Date  . HERNIA REPAIR  Aug 2012   right indirect inguinal, with mesh     reports that he has never smoked. He has never used smokeless tobacco. He reports current alcohol use of about 3.0 standard drinks of alcohol per week. He reports that he does not use drugs.  No Known Allergies  Family History  Problem Relation Age of Onset  . Diabetes Mother      Prior to Admission medications   Medication Sig Start Date End Date Taking? Authorizing Provider  acetaminophen (TYLENOL) 500 MG tablet Take 1,000 mg by mouth every 4 (four) hours as needed for mild pain or fever.    [provider]  carboxymethylcellulose (ARTIFICIAL TEARS) 1 % ophthalmic solution Apply 1 drop to eye 2 (two) times daily as needed (dry eyes).    [provider]  Multiple Vitamins-Minerals (MENS 50+ MULTI VITAMIN/MIN PO) Take 1 tablet by mouth daily.    [provider]  vitamin B-12 (CYANOCOBALAMIN) 1000 MCG tablet Take 1,000 mcg by mouth daily.    [provider]    Physical Exam: Vitals:   02/22/21 0216  BP: 112/72  Pulse: 80  Resp: 16  Temp: 97.8 F (36.6 C)  TempSrc: Oral  SpO2: 93%  Weight:  70.4 kg  Height: 5\' 8"  (1.727 m)    Constitutional: NAD, Calm, clear speech. Eyes: PERRL, lids and conjunctivae normal ENMT: Mucous membranes are moist. Posterior pharynx clear of any exudate or lesions.Normal dentition.  Neck: normal, supple, no masses, no thyromegaly Respiratory: clear to auscultation bilaterally, no wheezing, no crackles. Normal respiratory effort. No accessory muscle use.  Cardiovascular: Regular rate and rhythm, no murmurs / rubs / gallops. No extremity edema. 2+ pedal pulses. No carotid bruits.  Abdomen: no tenderness, no masses palpated. No hepatosplenomegaly. Bowel sounds  positive.  Musculoskeletal: no clubbing / cyanosis. No joint deformity upper and lower extremities. Good ROM, no contractures. Normal muscle tone.  Skin: no rashes, lesions, ulcers. No induration Neurologic: MAE, grossly non-focal  Psychiatric: Oriented to self, not location nor time, thinks he is "in a school".   Labs on Admission: I have personally reviewed following labs and imaging studies  CBC: Recent Labs  Lab 02/21/21 1351  WBC 5.4  NEUTROABS 4.9  HGB 12.5*  HCT 38.1*  MCV 86.8  PLT 329   Basic Metabolic Panel: Recent Labs  Lab 02/21/21 1351  NA 135  K 2.9*  CL 100  CO2 23  GLUCOSE 169*  BUN 20  CREATININE 0.52*  CALCIUM 8.9   GFR: Estimated Creatinine Clearance: 58.2 mL/min (A) (by C-G formula based on SCr of 0.52 mg/dL (L)). Liver Function Tests: Recent Labs  Lab 02/21/21 1351  AST 142*  ALT 156*  ALKPHOS 176*  BILITOT 8.6*  PROT 7.0  ALBUMIN 3.9   Recent Labs  Lab 02/21/21 1351  LIPASE 1,247*   No results for input(s): AMMONIA in the last 168 hours. Coagulation Profile: Recent Labs  Lab 02/21/21 1410  INR 1.2   Cardiac Enzymes: No results for input(s): CKTOTAL, CKMB, CKMBINDEX, TROPONINI in the last 168 hours. BNP (last 3 results) No results for input(s): PROBNP in the last 8760 hours. HbA1C: No results for input(s): HGBA1C in the last 72 hours. CBG: No results for input(s): GLUCAP in the last 168 hours. Lipid Profile: No results for input(s): CHOL, HDL, LDLCALC, TRIG, CHOLHDL, LDLDIRECT in the last 72 hours. Thyroid Function Tests: No results for input(s): TSH, T4TOTAL, FREET4, T3FREE, THYROIDAB in the last 72 hours. Anemia Panel: No results for input(s): VITAMINB12, FOLATE, FERRITIN, TIBC, IRON, RETICCTPCT in the last 72 hours. Urine analysis:    Component Value Date/Time   COLORURINE AMBER (A) 02/21/2021 1432   APPEARANCEUR CLEAR (A) 02/21/2021 1432   APPEARANCEUR Clear 05/14/2014 0948   LABSPEC 1.018 02/21/2021 1432    LABSPEC 1.011 05/14/2014 0948   PHURINE 6.0 02/21/2021 1432   GLUCOSEU 50 (A) 02/21/2021 1432   GLUCOSEU Negative 05/14/2014 0948   HGBUR NEGATIVE 02/21/2021 1432   BILIRUBINUR NEGATIVE 02/21/2021 1432   BILIRUBINUR Negative 05/14/2014 0948   KETONESUR 5 (A) 02/21/2021 1432   PROTEINUR NEGATIVE 02/21/2021 1432   NITRITE NEGATIVE 02/21/2021 1432   LEUKOCYTESUR NEGATIVE 02/21/2021 1432   LEUKOCYTESUR Negative 05/14/2014 0948    Radiological Exams on Admission: CT ABDOMEN PELVIS W CONTRAST  Addendum Date: 02/21/2021   ADDENDUM REPORT: 02/21/2021 22:32 ADDENDUM: Please note findings and impression should mention suggestion of choledocholithiasis (2:36, 5:51). Electronically Signed   By: Iven Finn M.D.   On: 02/21/2021 22:32   Result Date: 02/21/2021 CLINICAL DATA:  Nonlocalized acute abdominal pain. EXAM: CT ABDOMEN AND PELVIS WITH CONTRAST TECHNIQUE: Multidetector CT imaging of the abdomen and pelvis was performed using the standard protocol following bolus administration of intravenous contrast. CONTRAST:  182mL OMNIPAQUE IOHEXOL 300 MG/ML  SOLN COMPARISON:  None. FINDINGS: Lower chest: Bilateral lower lobe airspace opacities. Limited evaluation due to respiratory motion artifact. Coronary artery calcifications. Hepatobiliary: There is a simple fluid density 2.2 cm lesion within the left hepatic lobe that likely represents a simple hepatic cyst (2:23). No focal liver abnormality. No gallstones. No pericholecystic fluid. Question irregular gallbladder wall thickening (5:29). Mild diffuse intrahepatic biliary ductal dilatation. Common bile duct is enlarged measuring up to 12 mm. Pancreas: No focal lesion. Normal pancreatic contour. No surrounding inflammatory changes. No main pancreatic ductal dilatation. Spleen: Normal in size without focal abnormality. Adrenals/Urinary Tract: No adrenal nodule bilaterally. Bilateral kidneys enhance symmetrically. Subcentimeter hypodensities are too small to  characterize. No hydronephrosis. No hydroureter. The urinary bladder is distended with urine. There is irregular urinary bladder wall thickening. Stomach/Bowel: Stomach is within normal limits. No evidence of bowel wall thickening or dilatation. Diffuse colonic diverticulosis. Appendix appears normal. Vascular/Lymphatic: No abdominal aorta or iliac aneurysm. Severe atherosclerotic plaque of the aorta and its branches. No abdominal, pelvic, or inguinal lymphadenopathy. Reproductive: The prostate is enlarged measuring up to 5.2 cm. Median lobe hypertrophy is noted with associated mass effect on the posterior bladder wall. Other: No intraperitoneal free fluid. No intraperitoneal free gas. No organized fluid collection. Musculoskeletal: Left inguinal hernia containing fat and a short loop of small bowel. Diffusely decreased bone density. No suspicious lytic or blastic osseous lesions. No acute displaced fracture. Multilevel degenerative changes of the spine. Multilevel vertebral body height loss with chronic appearing compression fractures of the L1 and L3 vertebral bodies. IMPRESSION: 1. Bilateral lower lobe airspace opacities that could represent a combination of atelectasis and/or infection/inflammation. 2. Question irregular gallbladder wall thickening versus motion artifact. Associated common bile duct and intrahepatic biliary ductal dilatation. No pancreatic duct dilatation. Recommend right upper quadrant ultrasound for a more sensitive evaluation of the gallbladder. 3. Irregular urinary bladder wall thickening that may be related to obstructive uropathy in the setting of prostatomegaly; however, underlying infection or malignancy cannot be excluded. Recommend correlation with urinalysis and consideration of a urologic consultation. 4. Left inguinal hernia containing a short loop of small bowel with no findings to suggest associated ischemia or bowel obstruction. 5. Diffuse colonic diverticulosis with no acute  diverticulitis. 6. Diffusely decreased bone density with chronic appearing compression fractures of the L1-L3 vertebral bodies. 7.  Aortic Atherosclerosis (ICD10-I70.0). Electronically Signed: By: Iven Finn M.D. On: 02/21/2021 16:16   MR 3D Recon At Scanner  Result Date: 02/21/2021 CLINICAL DATA:  Concern for ductal stone EXAM: MRI ABDOMEN WITHOUT AND WITH CONTRAST (INCLUDING MRCP) TECHNIQUE: Multiplanar multisequence MR imaging of the abdomen was performed both before and after the administration of intravenous contrast. Heavily T2-weighted images of the biliary and pancreatic ducts were obtained, and three-dimensional MRCP images were rendered by post processing. CONTRAST:  7.71mL GADAVIST GADOBUTROL 1 MMOL/ML IV SOLN COMPARISON:  CT and ultrasound 02/21/2021 FINDINGS: Lower chest: Consolidative opacity in the bilateral lower lungs could reflect a combination of airspace disease and atelectasis. Trace bilateral effusions. Mediastinum is grossly unremarkable with evaluation limited by extensive cardiac pulsation. Hepatobiliary: Intra and extrahepatic biliary ductal dilatation with a 9 mm filling defect/gallstone present in the distal common bile duct and layering biliary sludge (series 12, image 57 and series 3, image 24). Distended gallbladder with mild gallbladder wall thickening demonstrating some postcontrast enhancement suggestive of acute cholecystitis. Scattered T2 hyperintense cysts are present throughout the liver, largest in the left lobe measuring up to 2.1 cm in  size towards the false form ligament, as seen on comparison CT and ultrasound as well. Additional tiny probable cysts throughout both lobes of the liver. Smooth liver surface contour. Normal intrinsic hepatic signal. Pancreas: Moderate pancreatic atrophy. No peripancreatic inflammation. No significant pancreatic ductal dilatation. Spleen: Normal in size. No concerning splenic lesions. Small accessory splenule seen medially.  Adrenals/Urinary Tract: Mild symmetric bilateral perinephric stranding, a nonspecific finding which may correlate with advanced age or decreased renal function. Small fluid attenuation cysts seen in both kidneys, largest on the right measuring up to 11 mm in size without concerning postcontrast enhancement no obstructive uropathy. No concerning renal mass. Stomach/Bowel: Small air-filled duodenal diverticula result in some signal dropout including in the region near the ligament of Treitz and more minimally towards the distal common bile duct though separate from the filling defects detailed above (3/25, 3/26). No abnormally thickened or conspicuous bowel. Scattered colonic diverticula Vascular/Lymphatic: Vascular calcification better assessed on CT imaging. No aneurysm or ectasia. No acute or worrisome abnormalities of the vessels. Other: Some mild perinephric stranding in T2 signal, as described above. No free air. No organized abscess or collection. Musculoskeletal: Grossly normal marrow signal. Dextrocurvature of the thoracic spine with levocurvature of the lumbar spine. Multilevel compression deformities, better seen on the multiplanar reconstructions of the CT imaging. Normal cord signal within the imaged levels. IMPRESSION: 1. Intra and extrahepatic biliary ductal dilatation with a 9 mm filling defect/gallstone present in the distal common bile duct and small amount of biliary sludge in the gallbladder and biliary tree. 2. Distended gallbladder with mild gallbladder wall thickening demonstrating postcontrast enhancement suggestive of acute cholecystitis. 3. Small air-filled duodenal diverticula result in some signal dropout in the region near the ligament of Treitz and more minimally towards the distal common bile duct though separate from the filling defects detailed on CT imaging. 4. Consolidative opacity in the bilateral lower lungs could reflect a combination of airspace disease and atelectasis. Trace  bilateral effusions. These results were called by telephone at the time of interpretation on 02/21/2021 at 10:43 pm to provider [facility] , who verbally acknowledged these results. Electronically Signed   By: Lovena Le M.D.   On: 02/21/2021 22:43   DG Chest Port 1 View  Result Date: 02/21/2021 CLINICAL DATA:  Abdominal pain and nausea for 3 days EXAM: PORTABLE CHEST 1 VIEW COMPARISON:  06/29/2011 FINDINGS: Single frontal view of the chest demonstrates a stable cardiac silhouette. Continued atherosclerosis of the thoracic aorta. There is increased density in the right paratracheal region, likely vascular shadow. Follow-up PA and lateral views of the chest may be useful for confirmation. Bilateral interstitial prominence compatible with scarring. No airspace disease, effusion, or pneumothorax. No acute bony abnormalities. S shaped scoliosis of the thoracolumbar spine. IMPRESSION: 1. Increased density right paratracheal region most consistent with vascular shadow. PA and lateral views of the chest may be useful for follow-up. 2. No acute airspace disease. Electronically Signed   By: Randa Ngo M.D.   On: 02/21/2021 15:28   MR ABDOMEN MRCP W WO CONTAST  Result Date: 02/21/2021 CLINICAL DATA:  Concern for ductal stone EXAM: MRI ABDOMEN WITHOUT AND WITH CONTRAST (INCLUDING MRCP) TECHNIQUE: Multiplanar multisequence MR imaging of the abdomen was performed both before and after the administration of intravenous contrast. Heavily T2-weighted images of the biliary and pancreatic ducts were obtained, and three-dimensional MRCP images were rendered by post processing. CONTRAST:  7.53mL GADAVIST GADOBUTROL 1 MMOL/ML IV SOLN COMPARISON:  CT and ultrasound 02/21/2021 FINDINGS: Lower chest:  Consolidative opacity in the bilateral lower lungs could reflect a combination of airspace disease and atelectasis. Trace bilateral effusions. Mediastinum is grossly unremarkable with evaluation limited by extensive cardiac  pulsation. Hepatobiliary: Intra and extrahepatic biliary ductal dilatation with a 9 mm filling defect/gallstone present in the distal common bile duct and layering biliary sludge (series 12, image 57 and series 3, image 24). Distended gallbladder with mild gallbladder wall thickening demonstrating some postcontrast enhancement suggestive of acute cholecystitis. Scattered T2 hyperintense cysts are present throughout the liver, largest in the left lobe measuring up to 2.1 cm in size towards the false form ligament, as seen on comparison CT and ultrasound as well. Additional tiny probable cysts throughout both lobes of the liver. Smooth liver surface contour. Normal intrinsic hepatic signal. Pancreas: Moderate pancreatic atrophy. No peripancreatic inflammation. No significant pancreatic ductal dilatation. Spleen: Normal in size. No concerning splenic lesions. Small accessory splenule seen medially. Adrenals/Urinary Tract: Mild symmetric bilateral perinephric stranding, a nonspecific finding which may correlate with advanced age or decreased renal function. Small fluid attenuation cysts seen in both kidneys, largest on the right measuring up to 11 mm in size without concerning postcontrast enhancement no obstructive uropathy. No concerning renal mass. Stomach/Bowel: Small air-filled duodenal diverticula result in some signal dropout including in the region near the ligament of Treitz and more minimally towards the distal common bile duct though separate from the filling defects detailed above (3/25, 3/26). No abnormally thickened or conspicuous bowel. Scattered colonic diverticula Vascular/Lymphatic: Vascular calcification better assessed on CT imaging. No aneurysm or ectasia. No acute or worrisome abnormalities of the vessels. Other: Some mild perinephric stranding in T2 signal, as described above. No free air. No organized abscess or collection. Musculoskeletal: Grossly normal marrow signal. Dextrocurvature of the  thoracic spine with levocurvature of the lumbar spine. Multilevel compression deformities, better seen on the multiplanar reconstructions of the CT imaging. Normal cord signal within the imaged levels. IMPRESSION: 1. Intra and extrahepatic biliary ductal dilatation with a 9 mm filling defect/gallstone present in the distal common bile duct and small amount of biliary sludge in the gallbladder and biliary tree. 2. Distended gallbladder with mild gallbladder wall thickening demonstrating postcontrast enhancement suggestive of acute cholecystitis. 3. Small air-filled duodenal diverticula result in some signal dropout in the region near the ligament of Treitz and more minimally towards the distal common bile duct though separate from the filling defects detailed on CT imaging. 4. Consolidative opacity in the bilateral lower lungs could reflect a combination of airspace disease and atelectasis. Trace bilateral effusions. These results were called by telephone at the time of interpretation on 02/21/2021 at 10:43 pm to provider [facility] , who verbally acknowledged these results. Electronically Signed   By: Lovena Le M.D.   On: 02/21/2021 22:43   US Abdomen Limited RUQ (LIVER/GB)  Result Date: 02/21/2021 CLINICAL DATA:  Vomiting EXAM: ULTRASOUND ABDOMEN LIMITED RIGHT UPPER QUADRANT COMPARISON:  None. FINDINGS: Gallbladder: Mildly distended gallbladder. Slight gallbladder wall thickening measuring up to 5 mm. Small amount of sludge within the gallbladder. No visible stones. Negative sonographic Murphy's. Common bile duct: Diameter: Upper limits normal in diameter, 7 mm. Liver: 2.4 cm cyst in the left hepatic lobe. Normal echotexture. Portal vein is patent on color Doppler imaging with normal direction of blood flow towards the liver. Other: None. IMPRESSION: Mildly distended gallbladder with slight gallbladder wall thickening and a small amount of sludge in the gallbladder. No visible stones or sonographic  Murphy sign. Electronically Signed   By: Lennette Bihari  [place] M.D.   On: 02/21/2021 17:09    EKG: Independently reviewed.  Assessment/Plan Principal Problem:   Ascending cholangitis Active Problems:   Diabetes mellitus type 2, diet-controlled ([place])   Vascular dementia without behavioral disturbance ([place])   Choledocholithiasis with acute cholecystitis with obstruction   E coli bacteremia   PAF (paroxysmal atrial fibrillation) ([facility])   Acute gallstone pancreatitis   Abnormal radiologic findings on diagnostic imaging of renal pelvis, ureter, or bladder   Sepsis due to Escherichia coli (E. coli) ([place])     1. Ascending cholangitis, sepsis due to e.coli bacteremia - 1. Will put pt on rocephin for now 2. IVF: 2L bolus in ED and LR at 125 3. Lactate nl 4. Strict intake and output 5. Tele monitor 2. Choledocholithiasis, CBD stone - 1. NPO 2. IVF as above 3. Message sent to Dr. Hilarie Fredrickson for AM consult, presumably needs ERCP 4. Morphine PRN pain 5. Zofran PRN nausea 3. Acute metabolic encephalopathy - 1. Delirium on top of vascular dementia due to above. 4. PAF - 1. Tele monitor 2. Med rec pending, but doesn't appear to be on any blood thinners that I can see (looks like someone did put in his meds at [facility] ED yesterday so presumably the 4 meds I see today are up to date). 1. INR nl (1.2) 5. Abn appearance of bladder - 1. Follow up with urology  DVT prophylaxis: SCDs Code Status: DNR - yellow form is in ACP documents Family Communication: No family in room Disposition Plan: Hopefully back to facility after treatment for cholangitis and CBD stone Consults called: Dr. Hilarie Fredrickson Admission status: Admit to inpatient  Severity of Illness: The appropriate patient status for this patient is INPATIENT. Inpatient status is judged to be reasonable and necessary in order to provide the required intensity of service to ensure the patient's safety. The patient's presenting symptoms, physical exam findings,  and initial radiographic and laboratory data in the context of their chronic comorbidities is felt to place them at high risk for further clinical deterioration. Furthermore, it is not anticipated that the patient will be medically stable for discharge from the hospital within 2 midnights of admission. The following factors support the patient status of inpatient.   IP status due to: 1) 71mm CBD stone requiring ERCP 2) sepsis and e.coli bacteremia secondary to #1 3) Delirium secondary to above   * I certify that at the point of admission it is my clinical judgment that the patient will require inpatient hospital care spanning beyond 2 midnights from the point of admission due to high intensity of service, high risk for further deterioration and high frequency of surveillance required.*    Shloima Clinch M. DO [facility]  How to contact the [facility] Attending or Consulting provider [place] or covering provider during after hours [place], for this patient?  1. Check the care team in [facility] and look for a) attending/consulting [facility] provider listed and b) the [facility] team listed 2. Log into [email or web address]  Amion Physician Scheduling and messaging for groups and whole hospitals  On call and physician scheduling software for group practices, residents, hospitalists and other medical providers for call, clinic, rotation and shift schedules. On[employer] is a hospital-wide system for scheduling doctors and paging doctors on call. EasyPlot is for scientific plotting and data analysis.  [email or web address]  and use [place]'s universal password to access. If you do not have the password, please contact the hospital operator.  3. Locate the [facility] provider you  are looking for under [facility] and page to a number that you can be directly reached. 4. If you still have difficulty reaching the provider, please page the [facility] (Director on Call) for the Hospitalists listed on amion for assistance.  02/22/2021,  4:09 AM

## 2021-02-23 ENCOUNTER — Encounter (HOSPITAL_COMMUNITY): Payer: Self-pay | Admitting: Gastroenterology

## 2021-02-23 DIAGNOSIS — Z9889 Other specified postprocedural states: Secondary | ICD-10-CM

## 2021-02-23 DIAGNOSIS — E119 Type 2 diabetes mellitus without complications: Secondary | ICD-10-CM

## 2021-02-23 DIAGNOSIS — K8309 Other cholangitis: Secondary | ICD-10-CM | POA: Diagnosis not present

## 2021-02-23 LAB — URINE CULTURE: Culture: NO GROWTH

## 2021-02-23 LAB — COMPREHENSIVE METABOLIC PANEL
ALT: 81 U/L — ABNORMAL HIGH (ref 0–44)
AST: 47 U/L — ABNORMAL HIGH (ref 15–41)
Albumin: 2.5 g/dL — ABNORMAL LOW (ref 3.5–5.0)
Alkaline Phosphatase: 127 U/L — ABNORMAL HIGH (ref 38–126)
Anion gap: 7 (ref 5–15)
BUN: 11 mg/dL (ref 8–23)
CO2: 23 mmol/L (ref 22–32)
Calcium: 8.1 mg/dL — ABNORMAL LOW (ref 8.9–10.3)
Chloride: 104 mmol/L (ref 98–111)
Creatinine, Ser: 0.77 mg/dL (ref 0.61–1.24)
GFR, Estimated: >60 mL/min (ref >60)
Glucose, Bld: 146 mg/dL — ABNORMAL HIGH (ref 70–99)
Potassium: 3.6 mmol/L (ref 3.5–5.1)
Sodium: 134 mmol/L — ABNORMAL LOW (ref 135–145)
Total Bilirubin: 5.5 mg/dL — ABNORMAL HIGH (ref 0.3–1.2)
Total Protein: 5.3 g/dL — ABNORMAL LOW (ref 6.5–8.1)

## 2021-02-23 LAB — CBC WITH DIFFERENTIAL/PLATELET
Abs Immature Granulocytes: 0.03 10*3/uL (ref 0.00–0.07)
Basophils Absolute: 0 10*3/uL (ref 0.0–0.1)
Basophils Relative: 0 %
Eosinophils Absolute: 0 10*3/uL (ref 0.0–0.5)
Eosinophils Relative: 0 %
HCT: 35.9 % — ABNORMAL LOW (ref 39.0–52.0)
Hemoglobin: 11.6 g/dL — ABNORMAL LOW (ref 13.0–17.0)
Immature Granulocytes: 0 %
Lymphocytes Relative: 8 %
Lymphs Abs: 0.7 10*3/uL (ref 0.7–4.0)
MCH: 29.1 pg (ref 26.0–34.0)
MCHC: 32.3 g/dL (ref 30.0–36.0)
MCV: 90.2 fL (ref 80.0–100.0)
Monocytes Absolute: 0.5 10*3/uL (ref 0.1–1.0)
Monocytes Relative: 6 %
Neutro Abs: 7.2 10*3/uL (ref 1.7–7.7)
Neutrophils Relative %: 86 %
Platelets: 175 10*3/uL (ref 150–400)
RBC: 3.98 MIL/uL — ABNORMAL LOW (ref 4.22–5.81)
RDW: 14.4 % (ref 11.5–15.5)
WBC: 8.4 10*3/uL (ref 4.0–10.5)
nRBC: 0 % (ref 0.0–0.2)

## 2021-02-23 LAB — GLUCOSE, CAPILLARY
Glucose-Capillary: 142 mg/dL — ABNORMAL HIGH (ref 70–99)
Glucose-Capillary: 150 mg/dL — ABNORMAL HIGH (ref 70–99)
Glucose-Capillary: 165 mg/dL — ABNORMAL HIGH (ref 70–99)
Glucose-Capillary: 144 mg/dL — ABNORMAL HIGH (ref 70–99)
Glucose-Capillary: 165 mg/dL — ABNORMAL HIGH (ref 70–99)

## 2021-02-23 LAB — MAGNESIUM
Magnesium: 2 mg/dL (ref 1.7–2.4)
Magnesium: 2 mg/dL (ref 1.7–2.4)

## 2021-02-23 LAB — POTASSIUM: Potassium: 3.6 mmol/L (ref 3.5–5.1)

## 2021-02-23 LAB — PROCALCITONIN: Procalcitonin: 15.38 ng/mL

## 2021-02-23 LAB — PHOSPHORUS: Phosphorus: 2 mg/dL — ABNORMAL LOW (ref 2.5–4.6)

## 2021-02-23 LAB — TSH: TSH: 0.359 u[IU]/mL (ref 0.350–4.500)

## 2021-02-23 MED ORDER — POTASSIUM CHLORIDE 10 MEQ/100ML IV SOLN
10.0000 meq | INTRAVENOUS | Status: AC
  Administered 2021-02-23 (×5): 10 meq via INTRAVENOUS
  Filled 2021-02-23: qty 100

## 2021-02-23 MED ORDER — QUETIAPINE FUMARATE 50 MG PO TABS
25.0000 mg | ORAL_TABLET | Freq: Every day | ORAL | Status: DC
  Administered 2021-02-23 – 2021-02-27 (×5): 25 mg via ORAL
  Filled 2021-02-23 (×5): qty 1

## 2021-02-23 MED ORDER — POTASSIUM CHLORIDE CRYS ER 20 MEQ PO TBCR
40.0000 meq | EXTENDED_RELEASE_TABLET | Freq: Once | ORAL | Status: AC
  Administered 2021-02-23: 40 meq via ORAL
  Filled 2021-02-23: qty 2

## 2021-02-23 MED ORDER — HALOPERIDOL LACTATE 5 MG/ML IJ SOLN
1.0000 mg | Freq: Three times a day (TID) | INTRAMUSCULAR | Status: DC | PRN
  Administered 2021-02-23: 1 mg via INTRAVENOUS
  Filled 2021-02-23: qty 1

## 2021-02-23 MED ORDER — HALOPERIDOL LACTATE 5 MG/ML IJ SOLN: 1.0000 mg | Freq: Three times a day (TID) | INTRAMUSCULAR | Status: DC | PRN

## 2021-02-23 NOTE — Progress Notes (Signed)
PROGRESS NOTE    Hector Lucero  MVH:[id number] DOB: 16-Sep-1928 DOA: 02/22/2021 PCP: Venia Carbon, MD     Brief Narrative:  Hector Lucero is a 87 y.o. WM PMHx Paroxysmal Atrial Fibrillation, SVT, HTN, DM type II diet controlled.  HLD, OSA,  Presents to ED at [facility] with c/o abd pain.  Onset 2 days ago, progressively worsening, diffuse, associated nausea.  Has had vomiting and constipation.  Also chills and generalized weakness, mod difficulty walking due to weakness.   ED Course: Pt found to have Tm 104.2, have ascending cholangitis with 77mm CBD stone on MRCP.  Started on cefepime, flagyl, vanc empirically (before he was found to have E.Coli bacteremia on culture that has since resulted).  Dr. Hilarie Fredrickson consulted (see note).  While in ED pt also apparently went into SVT, couldn't convert with adenosine so got multiple rounds of Cardizem which converted him to NSR.  Lipase over 1k.  Got 2L IVF bolus.  At this time pt denies any pain (specifically denies abd pain), says "I never did have any".  Unfortunately patient seems to have developed delirium since being in the ED yesterday afternoon.  Pt not oriented to location nor time.  Thinks he is "in a school".  Pt seems to be pleasantly confused at the moment.  Is redirectable.  Subjective: 4/17 afebrile overnight, A/O x1 (does not know where, when, why).  Believes he is in [place] and [place]: Covid vaccination;   Principal Problem:   Ascending cholangitis Active Problems:   Diabetes mellitus type 2, diet-controlled ([place])   Vascular dementia without behavioral disturbance ([place])   Choledocholithiasis with acute cholecystitis with obstruction   E coli bacteremia   PAF (paroxysmal atrial fibrillation) (HCC)   Acute gallstone pancreatitis   Abnormal radiologic findings on diagnostic imaging of renal pelvis, ureter, or bladder   Sepsis due to Escherichia coli (E. coli) (HCC)  Choledocholithiasis  with acute cholecystitis with obstruction -28mm CBD stone requiring ERCP - 4/16 s/p ERCP see results below - Continue current antibiotics for at least 14 days - 4/17 GI signed off but recommended surgical consult for consideration of cholecystectomy - Will allow patient to complete at least 48 to 72 hours of antibiotics before consulting surgery given his bacteremia and acute metabolic mental status change.  Sepsis - Upon admission patient does not meet sepsis criteria. -Multifactorial cholecystitis and bacteremia  Bacteremia positive E. coli - Continue 2-week course antibiotics -Trend procalcitonin Results for ZAKHAI, MEISINGER (MRN [id number]) as of 02/23/2021 11:44  Ref. Range 02/22/2021 08:52 02/23/2021 00:40  Procalcitonin Latest Units: ng/mL 21.76 15.38   Lactic acidosis -Resolved  Hypokalemia - Potassium goal> 4 - 4/17 potassium IV 50 mEq  Delirium/ Agitation  -4/17 soft restraints - Haldol PRN -Seroquel 25 mg daily  Diabetes type 2 controlled without complication - 0/27 hemoglobin A1c pending - 4/17 lipid panel pending - Sensitive SSI     DVT prophylaxis: SCD Code Status: DNR Family Communication:  Status is: Inpatient    Dispo: The patient is from: Home              Anticipated d/c is to: Home              Anticipated d/c date is: 4/20              Patient currently unstable      Consultants:  Dr. Hilarie Fredrickson GI  Procedures/Significant Events:  4/16 ERCP;-Choledocholithiasis was found. Complete removal  was  accomplished by biliary sphincterotomy and  balloon extraction. -One plastic pancreatic stent was placed into the  ventral pancreatic duct. But no dye was injected into the pancreas - A precut biliary sphincterotomy was performed. - A needle-knife biliary sphincterotomy was performed. - A biliary sphincterotomy extension was performed. -The biliary tree was swept at the end of the procedure and nothing was found.    I have personally reviewed and  interpreted all radiology studies and my findings are as above.  VENTILATOR SETTINGS: Nasal cannula 4/17 Flow 2 L/min SPO2 95%    Cultures 4/15 blood LEFT wrist positive E. coli 4/15 blood LEFT AC positive E. coli   Antimicrobials: Anti-infectives (From admission, onward)   Start     Ordered Stop   02/22/21 0400  cefTRIAXone (ROCEPHIN) 2 g in sodium chloride 0.9 % 100 mL IVPB        02/22/21 0354         Devices    LINES / TUBES:      Continuous Infusions: . cefTRIAXone (ROCEPHIN)  IV 2 g (02/23/21 0500)  . lactated ringers 125 mL/hr at 02/23/21 0608     Objective: Vitals:   02/23/21 0300 02/23/21 0601 02/23/21 0700 02/23/21 0748  BP: 129/83 129/77 137/89 120/75  Pulse: 81  88 88  Resp: (!) 22  19 20   Temp: 98 F (36.7 C)   98.3 F (36.8 C)  TempSrc: Oral   Oral  SpO2: 94%  91% 92%  Weight:      Height:        Intake/Output Summary (Last 24 hours) at 02/23/2021 0806 Last data filed at 02/23/2021 0443 Gross per 24 hour  Intake 1475 ml  Output 878 ml  Net 597 ml   Filed Weights   02/22/21 0216  Weight: 70.4 kg    Examination: Physical Exam:  General: A/O x1 (does not know where, when, why).  Believes he is in [place] Positive acute respiratory distress Eyes: negative scleral hemorrhage, negative anisocoria, negative icterus ENT: Negative Runny nose, negative gingival bleeding, Neck:  Negative scars, masses, torticollis, lymphadenopathy, JVD Lungs: Clear to auscultation bilaterally without wheezes or crackles Cardiovascular: Regular rate and rhythm without murmur gallop or rub normal S1 and S2 Abdomen: negative abdominal pain, nondistended, positive soft, bowel sounds, no rebound, no ascites, no appreciable mass Extremities: No significant cyanosis, clubbing, or edema bilateral lower extremities Skin: Negative rashes, lesions, ulcers Psychiatric: Unable to appropriately evaluate secondary to delirium/agitation Central nervous system:   Cranial nerves II through XII intact, tongue/uvula midline, all extremities muscle strength 5/5, sensation intact throughout, negative dysarthria, negative expressive aphasia, negative receptive aphasia..     Data Reviewed: Care during the described time interval was provided by me .  I have reviewed this patient's available data, including medical history, events of note, physical examination, and all test results as part of my evaluation.  CBC: Recent Labs  Lab 02/21/21 1351 02/22/21 0451 02/23/21 0040  WBC 5.4 6.7 8.4  NEUTROABS 4.9  --  7.2  HGB 12.5* 11.6* 11.6*  HCT 38.1* 35.4* 35.9*  MCV 86.8 89.8 90.2  PLT 189 171 401   Basic Metabolic Panel: Recent Labs  Lab 02/21/21 1351 02/22/21 0451 02/22/21 0852 02/23/21 0040  NA 135 136  --  134*  K 2.9* 3.1*  --  3.6  3.6  CL 100 105  --  104  CO2 23 25  --  23  GLUCOSE 169* 126*  --  146*  BUN 20  11  --  11  CREATININE 0.52* 0.75  --  0.77  CALCIUM 8.9 8.1*  --  8.1*  MG  --   --  2.0 2.0  2.0  PHOS  --   --  2.6 2.0*   GFR: Estimated Creatinine Clearance: 58.2 mL/min (by C-G formula based on SCr of 0.77 mg/dL). Liver Function Tests: Recent Labs  Lab 02/21/21 1351 02/22/21 0451 02/23/21 0040  AST 142* 78* 47*  ALT 156* 105* 81*  ALKPHOS 176* 128* 127*  BILITOT 8.6* 7.7* 5.5*  PROT 7.0 5.2* 5.3*  ALBUMIN 3.9 2.7* 2.5*   Recent Labs  Lab 02/21/21 1351  LIPASE 1,247*   No results for input(s): AMMONIA in the last 168 hours. Coagulation Profile: Recent Labs  Lab 02/21/21 1410  INR 1.2   Cardiac Enzymes: No results for input(s): CKTOTAL, CKMB, CKMBINDEX, TROPONINI in the last 168 hours. BNP (last 3 results) No results for input(s): PROBNP in the last 8760 hours. HbA1C: No results for input(s): HGBA1C in the last 72 hours. CBG: Recent Labs  Lab 02/22/21 1540 02/22/21 1951 02/22/21 2313 02/23/21 0403 02/23/21 0750  GLUCAP 111* 163* 148* 142* 150*   Lipid Profile: No results for input(s):  CHOL, HDL, LDLCALC, TRIG, CHOLHDL, LDLDIRECT in the last 72 hours. Thyroid Function Tests: Recent Labs    02/23/21 0040  TSH 0.359   Anemia Panel: No results for input(s): VITAMINB12, FOLATE, FERRITIN, TIBC, IRON, RETICCTPCT in the last 72 hours. Sepsis Labs: Recent Labs  Lab 02/21/21 1351 02/21/21 1732 02/22/21 0852 02/22/21 1428 02/23/21 0040  PROCALCITON  --   --  21.76  --  15.38  LATICACIDVEN 2.2* 1.5 1.3 1.2  --     Recent Results (from the past 240 hour(s))  Blood Culture (routine x 2)     Status: None (Preliminary result)   Collection Time: 02/21/21  1:51 PM   Specimen: BLOOD  Result Value Ref Range Status   Specimen Description BLOOD BLOOD LEFT WRIST  Final   Special Requests   Final    BOTTLES DRAWN AEROBIC AND ANAEROBIC Blood Culture adequate volume   Culture  Setup Time   Final    GRAM NEGATIVE RODS IN BOTH AEROBIC AND ANAEROBIC BOTTLES CRITICAL VALUE NOTED.  VALUE IS CONSISTENT WITH PREVIOUSLY REPORTED AND CALLED VALUE. Performed at [facility], [address] [place], [place] [address]    Culture GRAM NEGATIVE RODS  Final   Report Status PENDING  Incomplete  Resp Panel by RT-PCR (Flu A&B, Covid) Nasopharyngeal Swab     Status: None   Collection Time: 02/21/21  2:10 PM   Specimen: Nasopharyngeal Swab; Nasopharyngeal(NP) swabs in vial transport medium  Result Value Ref Range Status   SARS Coronavirus 2 by RT PCR NEGATIVE NEGATIVE Final    Comment: (NOTE) SARS-CoV-2 target nucleic acids are NOT DETECTED.  The SARS-CoV-2 RNA is generally detectable in upper respiratory specimens during the acute phase of infection. The lowest concentration of SARS-CoV-2 viral copies this assay can detect is 138 copies/mL. A negative result does not preclude SARS-Cov-2 infection and should not be used as the sole basis for treatment or other patient management decisions. A negative result may occur with  improper specimen collection/handling, submission of  specimen other than nasopharyngeal swab, presence of viral mutation(s) within the areas targeted by this assay, and inadequate number of viral copies(<138 copies/mL). A negative result must be combined with clinical observations, patient history, and epidemiological information. The expected result is Negative.  Fact  Sheet for Patients:  EntrepreneurPulse.com.au  Fact Sheet for Healthcare Providers:  IncredibleEmployment.be  This test is no t yet approved or cleared by the [place] FDA and  has been authorized for detection and/or diagnosis of SARS-CoV-2 by FDA under an Emergency Use Authorization (EUA). This EUA will remain  in effect (meaning this test can be used) for the duration of the COVID-19 declaration under Section 564(b)(1) of the Act, 21 U.S.C.section 360bbb-3(b)(1), unless the authorization is terminated  or revoked sooner.       Influenza A by PCR NEGATIVE NEGATIVE Final   Influenza B by PCR NEGATIVE NEGATIVE Final    Comment: (NOTE) The Xpert Xpress SARS-CoV-2/FLU/RSV plus assay is intended as an aid in the diagnosis of influenza from Nasopharyngeal swab specimens and should not be used as a sole basis for treatment. Nasal washings and aspirates are unacceptable for Xpert Xpress SARS-CoV-2/FLU/RSV testing.  Fact Sheet for Patients: EntrepreneurPulse.com.au  Fact Sheet for Healthcare Providers: IncredibleEmployment.be  This test is not yet approved or cleared by the [place] FDA and has been authorized for detection and/or diagnosis of SARS-CoV-2 by FDA under an Emergency Use Authorization (EUA). This EUA will remain in effect (meaning this test can be used) for the duration of the COVID-19 declaration under Section 564(b)(1) of the Act, 21 U.S.C. section 360bbb-3(b)(1), unless the authorization is terminated or revoked.  Performed at [facility], [address] [place], [place] [address]   Blood Culture (routine x 2)     Status: None (Preliminary result)   Collection Time: 02/21/21  2:10 PM   Specimen: BLOOD  Result Value Ref Range Status   Specimen Description BLOOD LEFT ANTECUBITAL  Final   Special Requests   Final    BOTTLES DRAWN AEROBIC AND ANAEROBIC Blood Culture adequate volume   Culture  Setup Time   Final    Organism ID to follow GRAM NEGATIVE RODS IN BOTH AEROBIC AND ANAEROBIC BOTTLES CRITICAL RESULT CALLED TO, READ BACK BY AND VERIFIED WITH: KISHAN PATEL ON 02/22/21 AT 0017 MF Performed at [facility], [address] [place], [place] [address]    Culture GRAM NEGATIVE RODS  Final   Report Status PENDING  Incomplete  Blood Culture ID Panel (Reflexed)     Status: Abnormal   Collection Time: 02/21/21  2:10 PM  Result Value Ref Range Status   Enterococcus faecalis NOT DETECTED NOT DETECTED Final   Enterococcus Faecium NOT DETECTED NOT DETECTED Final   Listeria monocytogenes NOT DETECTED NOT DETECTED Final   Staphylococcus species NOT DETECTED NOT DETECTED Final   Staphylococcus aureus (BCID) NOT DETECTED NOT DETECTED Final   Staphylococcus epidermidis NOT DETECTED NOT DETECTED Final   Staphylococcus lugdunensis NOT DETECTED NOT DETECTED Final   Streptococcus species NOT DETECTED NOT DETECTED Final   Streptococcus agalactiae NOT DETECTED NOT DETECTED Final   Streptococcus pneumoniae NOT DETECTED NOT DETECTED Final   Streptococcus pyogenes NOT DETECTED NOT DETECTED Final   A.calcoaceticus-baumannii NOT DETECTED NOT DETECTED Final   Bacteroides fragilis NOT DETECTED NOT DETECTED Final   Enterobacterales DETECTED (A) NOT DETECTED Final    Comment: Enterobacterales represent a large order of gram negative bacteria, not a single organism. CRITICAL RESULT CALLED TO, READ BACK BY AND VERIFIED WITH: Winfield Rast LEPLAT AT 5/02/22/22 MF    Enterobacter cloacae complex NOT DETECTED NOT DETECTED Final   Escherichia coli DETECTED  (A) NOT DETECTED Final    Comment: CRITICAL RESULT CALLED TO, READ BACK BY AND VERIFIED WITH: Winfield Rast LEPLAT  AT 0016 02/22/21 MF    Klebsiella aerogenes NOT DETECTED NOT DETECTED Final   Klebsiella oxytoca NOT DETECTED NOT DETECTED Final   Klebsiella pneumoniae NOT DETECTED NOT DETECTED Final   Proteus species NOT DETECTED NOT DETECTED Final   Salmonella species NOT DETECTED NOT DETECTED Final   Serratia marcescens NOT DETECTED NOT DETECTED Final   Haemophilus influenzae NOT DETECTED NOT DETECTED Final   Neisseria meningitidis NOT DETECTED NOT DETECTED Final   Pseudomonas aeruginosa NOT DETECTED NOT DETECTED Final   Stenotrophomonas maltophilia NOT DETECTED NOT DETECTED Final   Candida albicans NOT DETECTED NOT DETECTED Final   Candida auris NOT DETECTED NOT DETECTED Final   Candida glabrata NOT DETECTED NOT DETECTED Final   Candida krusei NOT DETECTED NOT DETECTED Final   Candida parapsilosis NOT DETECTED NOT DETECTED Final   Candida tropicalis NOT DETECTED NOT DETECTED Final   Cryptococcus neoformans/gattii NOT DETECTED NOT DETECTED Final   CTX-M ESBL NOT DETECTED NOT DETECTED Final   Carbapenem resistance IMP NOT DETECTED NOT DETECTED Final   Carbapenem resistance KPC NOT DETECTED NOT DETECTED Final   Carbapenem resistance NDM NOT DETECTED NOT DETECTED Final   Carbapenem resist OXA 48 LIKE NOT DETECTED NOT DETECTED Final   Carbapenem resistance VIM NOT DETECTED NOT DETECTED Final    Comment: Performed at [facility] [address] [place], [place] [address]  MRSA PCR Screening     Status: None   Collection Time: 02/22/21  4:24 AM   Specimen: Nasopharyngeal  Result Value Ref Range Status   MRSA by PCR NEGATIVE NEGATIVE Final    Comment:        The GeneXpert MRSA Assay (FDA approved for NASAL specimens only), is one component of a comprehensive MRSA colonization surveillance program. It is not intended to diagnose MRSA infection nor to guide or monitor treatment  for MRSA infections. Performed at [facility], [place] [address], [place], [place] [address]          Radiology Studies: CT ABDOMEN PELVIS W CONTRAST  Addendum Date: 02/21/2021   ADDENDUM REPORT: 02/21/2021 22:32 ADDENDUM: Please note findings and impression should mention suggestion of choledocholithiasis (2:36, 5:51). Electronically Signed   By: Iven Finn M.D.   On: 02/21/2021 22:32   Result Date: 02/21/2021 CLINICAL DATA:  Nonlocalized acute abdominal pain. EXAM: CT ABDOMEN AND PELVIS WITH CONTRAST TECHNIQUE: Multidetector CT imaging of the abdomen and pelvis was performed using the standard protocol following bolus administration of intravenous contrast. CONTRAST:  180mL OMNIPAQUE IOHEXOL 300 MG/ML  SOLN COMPARISON:  None. FINDINGS: Lower chest: Bilateral lower lobe airspace opacities. Limited evaluation due to respiratory motion artifact. Coronary artery calcifications. Hepatobiliary: There is a simple fluid density 2.2 cm lesion within the left hepatic lobe that likely represents a simple hepatic cyst (2:23). No focal liver abnormality. No gallstones. No pericholecystic fluid. Question irregular gallbladder wall thickening (5:29). Mild diffuse intrahepatic biliary ductal dilatation. Common bile duct is enlarged measuring up to 12 mm. Pancreas: No focal lesion. Normal pancreatic contour. No surrounding inflammatory changes. No main pancreatic ductal dilatation. Spleen: Normal in size without focal abnormality. Adrenals/Urinary Tract: No adrenal nodule bilaterally. Bilateral kidneys enhance symmetrically. Subcentimeter hypodensities are too small to characterize. No hydronephrosis. No hydroureter. The urinary bladder is distended with urine. There is irregular urinary bladder wall thickening. Stomach/Bowel: Stomach is within normal limits. No evidence of bowel wall thickening or dilatation. Diffuse colonic diverticulosis. Appendix appears normal. Vascular/Lymphatic: No abdominal aorta or  iliac aneurysm. Severe atherosclerotic plaque of  the aorta and its branches. No abdominal, pelvic, or inguinal lymphadenopathy. Reproductive: The prostate is enlarged measuring up to 5.2 cm. Median lobe hypertrophy is noted with associated mass effect on the posterior bladder wall. Other: No intraperitoneal free fluid. No intraperitoneal free gas. No organized fluid collection. Musculoskeletal: Left inguinal hernia containing fat and a short loop of small bowel. Diffusely decreased bone density. No suspicious lytic or blastic osseous lesions. No acute displaced fracture. Multilevel degenerative changes of the spine. Multilevel vertebral body height loss with chronic appearing compression fractures of the L1 and L3 vertebral bodies. IMPRESSION: 1. Bilateral lower lobe airspace opacities that could represent a combination of atelectasis and/or infection/inflammation. 2. Question irregular gallbladder wall thickening versus motion artifact. Associated common bile duct and intrahepatic biliary ductal dilatation. No pancreatic duct dilatation. Recommend right upper quadrant ultrasound for a more sensitive evaluation of the gallbladder. 3. Irregular urinary bladder wall thickening that may be related to obstructive uropathy in the setting of prostatomegaly; however, underlying infection or malignancy cannot be excluded. Recommend correlation with urinalysis and consideration of a urologic consultation. 4. Left inguinal hernia containing a short loop of small bowel with no findings to suggest associated ischemia or bowel obstruction. 5. Diffuse colonic diverticulosis with no acute diverticulitis. 6. Diffusely decreased bone density with chronic appearing compression fractures of the L1-L3 vertebral bodies. 7.  Aortic Atherosclerosis (ICD10-I70.0). Electronically Signed: By: Iven Finn M.D. On: 02/21/2021 16:16   MR 3D Recon At Scanner  Result Date: 02/21/2021 CLINICAL DATA:  Concern for ductal stone EXAM: MRI  ABDOMEN WITHOUT AND WITH CONTRAST (INCLUDING MRCP) TECHNIQUE: Multiplanar multisequence MR imaging of the abdomen was performed both before and after the administration of intravenous contrast. Heavily T2-weighted images of the biliary and pancreatic ducts were obtained, and three-dimensional MRCP images were rendered by post processing. CONTRAST:  7.2mL GADAVIST GADOBUTROL 1 MMOL/ML IV SOLN COMPARISON:  CT and ultrasound 02/21/2021 FINDINGS: Lower chest: Consolidative opacity in the bilateral lower lungs could reflect a combination of airspace disease and atelectasis. Trace bilateral effusions. Mediastinum is grossly unremarkable with evaluation limited by extensive cardiac pulsation. Hepatobiliary: Intra and extrahepatic biliary ductal dilatation with a 9 mm filling defect/gallstone present in the distal common bile duct and layering biliary sludge (series 12, image 57 and series 3, image 24). Distended gallbladder with mild gallbladder wall thickening demonstrating some postcontrast enhancement suggestive of acute cholecystitis. Scattered T2 hyperintense cysts are present throughout the liver, largest in the left lobe measuring up to 2.1 cm in size towards the false form ligament, as seen on comparison CT and ultrasound as well. Additional tiny probable cysts throughout both lobes of the liver. Smooth liver surface contour. Normal intrinsic hepatic signal. Pancreas: Moderate pancreatic atrophy. No peripancreatic inflammation. No significant pancreatic ductal dilatation. Spleen: Normal in size. No concerning splenic lesions. Small accessory splenule seen medially. Adrenals/Urinary Tract: Mild symmetric bilateral perinephric stranding, a nonspecific finding which may correlate with advanced age or decreased renal function. Small fluid attenuation cysts seen in both kidneys, largest on the right measuring up to 11 mm in size without concerning postcontrast enhancement no obstructive uropathy. No concerning renal  mass. Stomach/Bowel: Small air-filled duodenal diverticula result in some signal dropout including in the region near the ligament of Treitz and more minimally towards the distal common bile duct though separate from the filling defects detailed above (3/25, 3/26). No abnormally thickened or conspicuous bowel. Scattered colonic diverticula Vascular/Lymphatic: Vascular calcification better assessed on CT imaging. No aneurysm or ectasia. No acute or worrisome  abnormalities of the vessels. Other: Some mild perinephric stranding in T2 signal, as described above. No free air. No organized abscess or collection. Musculoskeletal: Grossly normal marrow signal. Dextrocurvature of the thoracic spine with levocurvature of the lumbar spine. Multilevel compression deformities, better seen on the multiplanar reconstructions of the CT imaging. Normal cord signal within the imaged levels. IMPRESSION: 1. Intra and extrahepatic biliary ductal dilatation with a 9 mm filling defect/gallstone present in the distal common bile duct and small amount of biliary sludge in the gallbladder and biliary tree. 2. Distended gallbladder with mild gallbladder wall thickening demonstrating postcontrast enhancement suggestive of acute cholecystitis. 3. Small air-filled duodenal diverticula result in some signal dropout in the region near the ligament of Treitz and more minimally towards the distal common bile duct though separate from the filling defects detailed on CT imaging. 4. Consolidative opacity in the bilateral lower lungs could reflect a combination of airspace disease and atelectasis. Trace bilateral effusions. These results were called by telephone at the time of interpretation on 02/21/2021 at 10:43 pm to provider [facility] , who verbally acknowledged these results. Electronically Signed   By: Lovena Le M.D.   On: 02/21/2021 22:43   DG Chest Port 1 View  Result Date: 02/21/2021 CLINICAL DATA:  Abdominal pain and nausea for 3  days EXAM: PORTABLE CHEST 1 VIEW COMPARISON:  06/29/2011 FINDINGS: Single frontal view of the chest demonstrates a stable cardiac silhouette. Continued atherosclerosis of the thoracic aorta. There is increased density in the right paratracheal region, likely vascular shadow. Follow-up PA and lateral views of the chest may be useful for confirmation. Bilateral interstitial prominence compatible with scarring. No airspace disease, effusion, or pneumothorax. No acute bony abnormalities. S shaped scoliosis of the thoracolumbar spine. IMPRESSION: 1. Increased density right paratracheal region most consistent with vascular shadow. PA and lateral views of the chest may be useful for follow-up. 2. No acute airspace disease. Electronically Signed   By: Randa Ngo M.D.   On: 02/21/2021 15:28   DG ERCP  Result Date: 02/22/2021 CLINICAL DATA:  Choledocholithiasis EXAM: ERCP TECHNIQUE: Multiple spot images obtained with the fluoroscopic device and submitted for interpretation post-procedure. FLUOROSCOPY TIME:  Fluoroscopy Time:  7 minutes 46 seconds Radiation Exposure Index (if provided by the fluoroscopic device): 34 mGy Number of Acquired Spot Images: 4 COMPARISON:  None. FINDINGS: Submitted intraoperative fluoroscopic images demonstrate cannulation and opacification of the common bile duct. The common bile duct is mildly dilated. A stent is noted adjacent to the common bile duct, possibly located within the pancreatic duct. IMPRESSION: Intraoperative fluoroscopic images of ERCP as above. These images were submitted for radiologic interpretation only. Please see the procedural report for the amount of contrast and the fluoroscopy time utilized. Electronically Signed   By: Miachel Roux M.D.   On: 02/22/2021 13:54   MR ABDOMEN MRCP W WO CONTAST  Result Date: 02/21/2021 CLINICAL DATA:  Concern for ductal stone EXAM: MRI ABDOMEN WITHOUT AND WITH CONTRAST (INCLUDING MRCP) TECHNIQUE: Multiplanar multisequence MR imaging  of the abdomen was performed both before and after the administration of intravenous contrast. Heavily T2-weighted images of the biliary and pancreatic ducts were obtained, and three-dimensional MRCP images were rendered by post processing. CONTRAST:  7.64mL GADAVIST GADOBUTROL 1 MMOL/ML IV SOLN COMPARISON:  CT and ultrasound 02/21/2021 FINDINGS: Lower chest: Consolidative opacity in the bilateral lower lungs could reflect a combination of airspace disease and atelectasis. Trace bilateral effusions. Mediastinum is grossly unremarkable with evaluation limited by extensive cardiac pulsation. Hepatobiliary:  Intra and extrahepatic biliary ductal dilatation with a 9 mm filling defect/gallstone present in the distal common bile duct and layering biliary sludge (series 12, image 57 and series 3, image 24). Distended gallbladder with mild gallbladder wall thickening demonstrating some postcontrast enhancement suggestive of acute cholecystitis. Scattered T2 hyperintense cysts are present throughout the liver, largest in the left lobe measuring up to 2.1 cm in size towards the false form ligament, as seen on comparison CT and ultrasound as well. Additional tiny probable cysts throughout both lobes of the liver. Smooth liver surface contour. Normal intrinsic hepatic signal. Pancreas: Moderate pancreatic atrophy. No peripancreatic inflammation. No significant pancreatic ductal dilatation. Spleen: Normal in size. No concerning splenic lesions. Small accessory splenule seen medially. Adrenals/Urinary Tract: Mild symmetric bilateral perinephric stranding, a nonspecific finding which may correlate with advanced age or decreased renal function. Small fluid attenuation cysts seen in both kidneys, largest on the right measuring up to 11 mm in size without concerning postcontrast enhancement no obstructive uropathy. No concerning renal mass. Stomach/Bowel: Small air-filled duodenal diverticula result in some signal dropout including in  the region near the ligament of Treitz and more minimally towards the distal common bile duct though separate from the filling defects detailed above (3/25, 3/26). No abnormally thickened or conspicuous bowel. Scattered colonic diverticula Vascular/Lymphatic: Vascular calcification better assessed on CT imaging. No aneurysm or ectasia. No acute or worrisome abnormalities of the vessels. Other: Some mild perinephric stranding in T2 signal, as described above. No free air. No organized abscess or collection. Musculoskeletal: Grossly normal marrow signal. Dextrocurvature of the thoracic spine with levocurvature of the lumbar spine. Multilevel compression deformities, better seen on the multiplanar reconstructions of the CT imaging. Normal cord signal within the imaged levels. IMPRESSION: 1. Intra and extrahepatic biliary ductal dilatation with a 9 mm filling defect/gallstone present in the distal common bile duct and small amount of biliary sludge in the gallbladder and biliary tree. 2. Distended gallbladder with mild gallbladder wall thickening demonstrating postcontrast enhancement suggestive of acute cholecystitis. 3. Small air-filled duodenal diverticula result in some signal dropout in the region near the ligament of Treitz and more minimally towards the distal common bile duct though separate from the filling defects detailed on CT imaging. 4. Consolidative opacity in the bilateral lower lungs could reflect a combination of airspace disease and atelectasis. Trace bilateral effusions. These results were called by telephone at the time of interpretation on 02/21/2021 at 10:43 pm to provider [facility] , who verbally acknowledged these results. Electronically Signed   By: Lovena Le M.D.   On: 02/21/2021 22:43   US Abdomen Limited RUQ (LIVER/GB)  Result Date: 02/21/2021 CLINICAL DATA:  Vomiting EXAM: ULTRASOUND ABDOMEN LIMITED RIGHT UPPER QUADRANT COMPARISON:  None. FINDINGS: Gallbladder: Mildly distended  gallbladder. Slight gallbladder wall thickening measuring up to 5 mm. Small amount of sludge within the gallbladder. No visible stones. Negative sonographic Murphy's. Common bile duct: Diameter: Upper limits normal in diameter, 7 mm. Liver: 2.4 cm cyst in the left hepatic lobe. Normal echotexture. Portal vein is patent on color Doppler imaging with normal direction of blood flow towards the liver. Other: None. IMPRESSION: Mildly distended gallbladder with slight gallbladder wall thickening and a small amount of sludge in the gallbladder. No visible stones or sonographic Murphy sign. Electronically Signed   By: Rolm Baptise M.D.   On: 02/21/2021 17:09        Scheduled Meds: . diltiazem  30 mg Oral Q6H  . insulin aspart  0-9 Units Subcutaneous  Q4H   Continuous Infusions: . cefTRIAXone (ROCEPHIN)  IV 2 g (02/23/21 0500)  . lactated ringers 125 mL/hr at 02/23/21 0608     LOS: 1 day    Time spent:5 min    Mattison Golay, Geraldo Docker, MD [facility]   If 7PM-7AM, please contact night-coverage 02/23/2021, 8:06 AM

## 2021-02-23 NOTE — Progress Notes (Signed)
Patient ID: Hector Lucero, male   DOB: 03-04-1928, 87 y.o.   MRN: [id number]    Progress Note   Subjective   Day # 2  CC; Cholangitis/E. coli bacteremia  ERCP yesterday with removal of common bile duct stone, and sphincterotomy, pancreatic duct stent placed  WBC 8.4/hemoglobin 11.6 T bili 5.5/alk phos 127/AST 47/ALT 81  Patient is alert, eating Jell-O, remains confused but cooperative.  Has no complaints of nausea no complaints of abdominal pain   Objective   Vital signs in last 24 hours: Temp:  [97.5 F (36.4 C)-98.9 F (37.2 C)] 98.7 F (37.1 C) (04/17 1100) Pulse Rate:  [76-88] 84 (04/17 1200) Resp:  [15-23] 20 (04/17 1200) BP: (110-137)/(67-99) 134/80 (04/17 1200) SpO2:  [91 %-96 %] 92 % (04/17 1200) Last BM Date: 02/22/21 General:    Elderly white male in NAD, pleasantly confused, icteric Heart:  Regular rate and rhythm; no murmurs Lungs: Respirations even and unlabored, lungs CTA bilaterally Abdomen:  Soft, nontender and nondistended. Normal bowel sounds. Extremities:  Without edema. Neurologic:  Alert and oriented,  grossly normal neurologically. Psych:  Cooperative. Normal mood and affect.  Intake/Output from previous day: 04/16 0701 - 04/17 0700 In: 3709 [P.O.:125; I.V.:1250; IV Piggyback:100] Out: 878 [Urine:875; Blood:3] Intake/Output this shift: Total I/O In: 200 [P.O.:200] Out: 400 [Urine:400]  Lab Results: Recent Labs    02/21/21 1351 02/22/21 0451 02/23/21 0040  WBC 5.4 6.7 8.4  HGB 12.5* 11.6* 11.6*  HCT 38.1* 35.4* 35.9*  PLT 189 171 175   BMET Recent Labs    02/21/21 1351 02/22/21 0451 02/23/21 0040  NA 135 136 134*  K 2.9* 3.1* 3.6  3.6  CL 100 105 104  CO2 23 25 23   GLUCOSE 169* 126* 146*  BUN 20 11 11   CREATININE 0.52* 0.75 0.77  CALCIUM 8.9 8.1* 8.1*   LFT Recent Labs    02/23/21 0040  PROT 5.3*  ALBUMIN 2.5*  AST 47*  ALT 81*  ALKPHOS 127*  BILITOT 5.5*   PT/INR Recent Labs    02/21/21 1410  LABPROT 15.0   INR 1.2    Studies/Results: CT ABDOMEN PELVIS W CONTRAST  Addendum Date: 02/21/2021   ADDENDUM REPORT: 02/21/2021 22:32 ADDENDUM: Please note findings and impression should mention suggestion of choledocholithiasis (2:36, 5:51). Electronically Signed   By: Iven Finn M.D.   On: 02/21/2021 22:32   Result Date: 02/21/2021 CLINICAL DATA:  Nonlocalized acute abdominal pain. EXAM: CT ABDOMEN AND PELVIS WITH CONTRAST TECHNIQUE: Multidetector CT imaging of the abdomen and pelvis was performed using the standard protocol following bolus administration of intravenous contrast. CONTRAST:  15m OMNIPAQUE IOHEXOL 300 MG/ML  SOLN COMPARISON:  None. FINDINGS: Lower chest: Bilateral lower lobe airspace opacities. Limited evaluation due to respiratory motion artifact. Coronary artery calcifications. Hepatobiliary: There is a simple fluid density 2.2 cm lesion within the left hepatic lobe that likely represents a simple hepatic cyst (2:23). No focal liver abnormality. No gallstones. No pericholecystic fluid. Question irregular gallbladder wall thickening (5:29). Mild diffuse intrahepatic biliary ductal dilatation. Common bile duct is enlarged measuring up to 12 mm. Pancreas: No focal lesion. Normal pancreatic contour. No surrounding inflammatory changes. No main pancreatic ductal dilatation. Spleen: Normal in size without focal abnormality. Adrenals/Urinary Tract: No adrenal nodule bilaterally. Bilateral kidneys enhance symmetrically. Subcentimeter hypodensities are too small to characterize. No hydronephrosis. No hydroureter. The urinary bladder is distended with urine. There is irregular urinary bladder wall thickening. Stomach/Bowel: Stomach is within normal limits. No evidence of bowel  wall thickening or dilatation. Diffuse colonic diverticulosis. Appendix appears normal. Vascular/Lymphatic: No abdominal aorta or iliac aneurysm. Severe atherosclerotic plaque of the aorta and its branches. No abdominal, pelvic, or  inguinal lymphadenopathy. Reproductive: The prostate is enlarged measuring up to 5.2 cm. Median lobe hypertrophy is noted with associated mass effect on the posterior bladder wall. Other: No intraperitoneal free fluid. No intraperitoneal free gas. No organized fluid collection. Musculoskeletal: Left inguinal hernia containing fat and a short loop of small bowel. Diffusely decreased bone density. No suspicious lytic or blastic osseous lesions. No acute displaced fracture. Multilevel degenerative changes of the spine. Multilevel vertebral body height loss with chronic appearing compression fractures of the L1 and L3 vertebral bodies. IMPRESSION: 1. Bilateral lower lobe airspace opacities that could represent a combination of atelectasis and/or infection/inflammation. 2. Question irregular gallbladder wall thickening versus motion artifact. Associated common bile duct and intrahepatic biliary ductal dilatation. No pancreatic duct dilatation. Recommend right upper quadrant ultrasound for a more sensitive evaluation of the gallbladder. 3. Irregular urinary bladder wall thickening that may be related to obstructive uropathy in the setting of prostatomegaly; however, underlying infection or malignancy cannot be excluded. Recommend correlation with urinalysis and consideration of a urologic consultation. 4. Left inguinal hernia containing a short loop of small bowel with no findings to suggest associated ischemia or bowel obstruction. 5. Diffuse colonic diverticulosis with no acute diverticulitis. 6. Diffusely decreased bone density with chronic appearing compression fractures of the L1-L3 vertebral bodies. 7.  Aortic Atherosclerosis (ICD10-I70.0). Electronically Signed: By: Iven Finn M.D. On: 02/21/2021 16:16   MR 3D Recon At Scanner  Result Date: 02/21/2021 CLINICAL DATA:  Concern for ductal stone EXAM: MRI ABDOMEN WITHOUT AND WITH CONTRAST (INCLUDING MRCP) TECHNIQUE: Multiplanar multisequence MR imaging of the  abdomen was performed both before and after the administration of intravenous contrast. Heavily T2-weighted images of the biliary and pancreatic ducts were obtained, and three-dimensional MRCP images were rendered by post processing. CONTRAST:  7.51m GADAVIST GADOBUTROL 1 MMOL/ML IV SOLN COMPARISON:  CT and ultrasound 02/21/2021 FINDINGS: Lower chest: Consolidative opacity in the bilateral lower lungs could reflect a combination of airspace disease and atelectasis. Trace bilateral effusions. Mediastinum is grossly unremarkable with evaluation limited by extensive cardiac pulsation. Hepatobiliary: Intra and extrahepatic biliary ductal dilatation with a 9 mm filling defect/gallstone present in the distal common bile duct and layering biliary sludge (series 12, image 57 and series 3, image 24). Distended gallbladder with mild gallbladder wall thickening demonstrating some postcontrast enhancement suggestive of acute cholecystitis. Scattered T2 hyperintense cysts are present throughout the liver, largest in the left lobe measuring up to 2.1 cm in size towards the false form ligament, as seen on comparison CT and ultrasound as well. Additional tiny probable cysts throughout both lobes of the liver. Smooth liver surface contour. Normal intrinsic hepatic signal. Pancreas: Moderate pancreatic atrophy. No peripancreatic inflammation. No significant pancreatic ductal dilatation. Spleen: Normal in size. No concerning splenic lesions. Small accessory splenule seen medially. Adrenals/Urinary Tract: Mild symmetric bilateral perinephric stranding, a nonspecific finding which may correlate with advanced age or decreased renal function. Small fluid attenuation cysts seen in both kidneys, largest on the right measuring up to 11 mm in size without concerning postcontrast enhancement no obstructive uropathy. No concerning renal mass. Stomach/Bowel: Small air-filled duodenal diverticula result in some signal dropout including in the  region near the ligament of Treitz and more minimally towards the distal common bile duct though separate from the filling defects detailed above (3/25, 3/26). No abnormally thickened  or conspicuous bowel. Scattered colonic diverticula Vascular/Lymphatic: Vascular calcification better assessed on CT imaging. No aneurysm or ectasia. No acute or worrisome abnormalities of the vessels. Other: Some mild perinephric stranding in T2 signal, as described above. No free air. No organized abscess or collection. Musculoskeletal: Grossly normal marrow signal. Dextrocurvature of the thoracic spine with levocurvature of the lumbar spine. Multilevel compression deformities, better seen on the multiplanar reconstructions of the CT imaging. Normal cord signal within the imaged levels. IMPRESSION: 1. Intra and extrahepatic biliary ductal dilatation with a 9 mm filling defect/gallstone present in the distal common bile duct and small amount of biliary sludge in the gallbladder and biliary tree. 2. Distended gallbladder with mild gallbladder wall thickening demonstrating postcontrast enhancement suggestive of acute cholecystitis. 3. Small air-filled duodenal diverticula result in some signal dropout in the region near the ligament of Treitz and more minimally towards the distal common bile duct though separate from the filling defects detailed on CT imaging. 4. Consolidative opacity in the bilateral lower lungs could reflect a combination of airspace disease and atelectasis. Trace bilateral effusions. These results were called by telephone at the time of interpretation on 02/21/2021 at 10:43 pm to provider [facility] , who verbally acknowledged these results. Electronically Signed   By: Lovena Le M.D.   On: 02/21/2021 22:43   DG Chest Port 1 View  Result Date: 02/21/2021 CLINICAL DATA:  Abdominal pain and nausea for 3 days EXAM: PORTABLE CHEST 1 VIEW COMPARISON:  06/29/2011 FINDINGS: Single frontal view of the chest  demonstrates a stable cardiac silhouette. Continued atherosclerosis of the thoracic aorta. There is increased density in the right paratracheal region, likely vascular shadow. Follow-up PA and lateral views of the chest may be useful for confirmation. Bilateral interstitial prominence compatible with scarring. No airspace disease, effusion, or pneumothorax. No acute bony abnormalities. S shaped scoliosis of the thoracolumbar spine. IMPRESSION: 1. Increased density right paratracheal region most consistent with vascular shadow. PA and lateral views of the chest may be useful for follow-up. 2. No acute airspace disease. Electronically Signed   By: Randa Ngo M.D.   On: 02/21/2021 15:28   DG ERCP  Result Date: 02/22/2021 CLINICAL DATA:  Choledocholithiasis EXAM: ERCP TECHNIQUE: Multiple spot images obtained with the fluoroscopic device and submitted for interpretation post-procedure. FLUOROSCOPY TIME:  Fluoroscopy Time:  7 minutes 46 seconds Radiation Exposure Index (if provided by the fluoroscopic device): 34 mGy Number of Acquired Spot Images: 4 COMPARISON:  None. FINDINGS: Submitted intraoperative fluoroscopic images demonstrate cannulation and opacification of the common bile duct. The common bile duct is mildly dilated. A stent is noted adjacent to the common bile duct, possibly located within the pancreatic duct. IMPRESSION: Intraoperative fluoroscopic images of ERCP as above. These images were submitted for radiologic interpretation only. Please see the procedural report for the amount of contrast and the fluoroscopy time utilized. Electronically Signed   By: Miachel Roux M.D.   On: 02/22/2021 13:54   MR ABDOMEN MRCP W WO CONTAST  Result Date: 02/21/2021 CLINICAL DATA:  Concern for ductal stone EXAM: MRI ABDOMEN WITHOUT AND WITH CONTRAST (INCLUDING MRCP) TECHNIQUE: Multiplanar multisequence MR imaging of the abdomen was performed both before and after the administration of intravenous contrast.  Heavily T2-weighted images of the biliary and pancreatic ducts were obtained, and three-dimensional MRCP images were rendered by post processing. CONTRAST:  7.14m GADAVIST GADOBUTROL 1 MMOL/ML IV SOLN COMPARISON:  CT and ultrasound 02/21/2021 FINDINGS: Lower chest: Consolidative opacity in the bilateral lower lungs could reflect  a combination of airspace disease and atelectasis. Trace bilateral effusions. Mediastinum is grossly unremarkable with evaluation limited by extensive cardiac pulsation. Hepatobiliary: Intra and extrahepatic biliary ductal dilatation with a 9 mm filling defect/gallstone present in the distal common bile duct and layering biliary sludge (series 12, image 57 and series 3, image 24). Distended gallbladder with mild gallbladder wall thickening demonstrating some postcontrast enhancement suggestive of acute cholecystitis. Scattered T2 hyperintense cysts are present throughout the liver, largest in the left lobe measuring up to 2.1 cm in size towards the false form ligament, as seen on comparison CT and ultrasound as well. Additional tiny probable cysts throughout both lobes of the liver. Smooth liver surface contour. Normal intrinsic hepatic signal. Pancreas: Moderate pancreatic atrophy. No peripancreatic inflammation. No significant pancreatic ductal dilatation. Spleen: Normal in size. No concerning splenic lesions. Small accessory splenule seen medially. Adrenals/Urinary Tract: Mild symmetric bilateral perinephric stranding, a nonspecific finding which may correlate with advanced age or decreased renal function. Small fluid attenuation cysts seen in both kidneys, largest on the right measuring up to 11 mm in size without concerning postcontrast enhancement no obstructive uropathy. No concerning renal mass. Stomach/Bowel: Small air-filled duodenal diverticula result in some signal dropout including in the region near the ligament of Treitz and more minimally towards the distal common bile duct  though separate from the filling defects detailed above (3/25, 3/26). No abnormally thickened or conspicuous bowel. Scattered colonic diverticula Vascular/Lymphatic: Vascular calcification better assessed on CT imaging. No aneurysm or ectasia. No acute or worrisome abnormalities of the vessels. Other: Some mild perinephric stranding in T2 signal, as described above. No free air. No organized abscess or collection. Musculoskeletal: Grossly normal marrow signal. Dextrocurvature of the thoracic spine with levocurvature of the lumbar spine. Multilevel compression deformities, better seen on the multiplanar reconstructions of the CT imaging. Normal cord signal within the imaged levels. IMPRESSION: 1. Intra and extrahepatic biliary ductal dilatation with a 9 mm filling defect/gallstone present in the distal common bile duct and small amount of biliary sludge in the gallbladder and biliary tree. 2. Distended gallbladder with mild gallbladder wall thickening demonstrating postcontrast enhancement suggestive of acute cholecystitis. 3. Small air-filled duodenal diverticula result in some signal dropout in the region near the ligament of Treitz and more minimally towards the distal common bile duct though separate from the filling defects detailed on CT imaging. 4. Consolidative opacity in the bilateral lower lungs could reflect a combination of airspace disease and atelectasis. Trace bilateral effusions. These results were called by telephone at the time of interpretation on 02/21/2021 at 10:43 pm to provider [facility] , who verbally acknowledged these results. Electronically Signed   By: Lovena Le M.D.   On: 02/21/2021 22:43   US Abdomen Limited RUQ (LIVER/GB)  Result Date: 02/21/2021 CLINICAL DATA:  Vomiting EXAM: ULTRASOUND ABDOMEN LIMITED RIGHT UPPER QUADRANT COMPARISON:  None. FINDINGS: Gallbladder: Mildly distended gallbladder. Slight gallbladder wall thickening measuring up to 5 mm. Small amount of sludge  within the gallbladder. No visible stones. Negative sonographic Murphy's. Common bile duct: Diameter: Upper limits normal in diameter, 7 mm. Liver: 2.4 cm cyst in the left hepatic lobe. Normal echotexture. Portal vein is patent on color Doppler imaging with normal direction of blood flow towards the liver. Other: None. IMPRESSION: Mildly distended gallbladder with slight gallbladder wall thickening and a small amount of sludge in the gallbladder. No visible stones or sonographic Murphy sign. Electronically Signed   By: Rolm Baptise M.D.   On: 02/21/2021 17:09  Assessment / Plan:    #51 87 year old white male admitted with ascending cholangitis and E. coli bacteremia. He is stable status post ERCP, sphincterotomy and stone extraction yesterday LFTs improving, WBC normal  #2 delirium secondary to #1 #3 possible cholecystitis-MRI suggested distended gallbladder with mild gallbladder wall thickening  #4 history of atrial fibrillation #5 adult onset diabetes mellitus #6 vascular dementia  Plan; advance to soft diet Continue course of antibiotics to treat bacteremia Consult surgery regarding possible cholecystitis  Continue to trend LFTs to normalization  Check KUB on Monday or Tuesday, PD stent assessment-should come out on its own  GI will be available if needed, please call for questions   Principal Problem:   Ascending cholangitis Active Problems:   Diabetes mellitus type 2, diet-controlled ([place])   Vascular dementia without behavioral disturbance ([place])   Choledocholithiasis with acute cholecystitis with obstruction   E coli bacteremia   PAF (paroxysmal atrial fibrillation) ([place])   Acute gallstone pancreatitis   Abnormal radiologic findings on diagnostic imaging of renal pelvis, ureter, or bladder   Sepsis due to Escherichia coli (E. coli) ([place])     LOS: 1 day   Tiffany Calmes PA-C 02/23/2021, 12:28 PM

## 2021-02-23 NOTE — Progress Notes (Signed)
Pt was confused and agitated almost all day. Haldol 1 mg and Seroquel 25 mg given at different timing  with minimal response noted from pt. Pt talked with Nephew and Wife [name] couple of times during the day, just to calm his down.  Tolerated liquid to soft diet. Completed 5 K rides, getting LR @125 , physical sitter was in bed side, mittens used as a safety protocol and skin reassessed frequently. Updated Nurse from [employer] [facility],   Will continue to monitor the pt.  Palma Holter, RN

## 2021-02-24 ENCOUNTER — Inpatient Hospital Stay (HOSPITAL_COMMUNITY): Payer: Medicare Other

## 2021-02-24 DIAGNOSIS — G9340 Encephalopathy, unspecified: Secondary | ICD-10-CM

## 2021-02-24 DIAGNOSIS — A4151 Sepsis due to Escherichia coli [E. coli]: Principal | ICD-10-CM

## 2021-02-24 DIAGNOSIS — R7401 Elevation of levels of liver transaminase levels: Secondary | ICD-10-CM | POA: Diagnosis not present

## 2021-02-24 DIAGNOSIS — K8309 Other cholangitis: Secondary | ICD-10-CM | POA: Diagnosis not present

## 2021-02-24 LAB — CULTURE, BLOOD (ROUTINE X 2)
Special Requests: ADEQUATE
Special Requests: ADEQUATE

## 2021-02-24 LAB — CBC WITH DIFFERENTIAL/PLATELET
Abs Immature Granulocytes: 0.05 10*3/uL (ref 0.00–0.07)
Basophils Absolute: 0 10*3/uL (ref 0.0–0.1)
Basophils Relative: 0 %
Eosinophils Absolute: 0 10*3/uL (ref 0.0–0.5)
Eosinophils Relative: 0 %
HCT: 32.4 % — ABNORMAL LOW (ref 39.0–52.0)
Hemoglobin: 10.8 g/dL — ABNORMAL LOW (ref 13.0–17.0)
Immature Granulocytes: 1 %
Lymphocytes Relative: 10 %
Lymphs Abs: 0.9 10*3/uL (ref 0.7–4.0)
MCH: 29.2 pg (ref 26.0–34.0)
MCHC: 33.3 g/dL (ref 30.0–36.0)
MCV: 87.6 fL (ref 80.0–100.0)
Monocytes Absolute: 0.7 10*3/uL (ref 0.1–1.0)
Monocytes Relative: 8 %
Neutro Abs: 7.2 10*3/uL (ref 1.7–7.7)
Neutrophils Relative %: 81 %
Platelets: 184 10*3/uL (ref 150–400)
RBC: 3.7 MIL/uL — ABNORMAL LOW (ref 4.22–5.81)
RDW: 14.4 % (ref 11.5–15.5)
WBC: 8.9 10*3/uL (ref 4.0–10.5)
nRBC: 0 % (ref 0.0–0.2)

## 2021-02-24 LAB — LIPID PANEL
Cholesterol: 154 mg/dL (ref 0–200)
HDL: 44 mg/dL (ref >40)
LDL Cholesterol: 92 mg/dL (ref 0–99)
Total CHOL/HDL Ratio: 3.5 RATIO
Triglycerides: 91 mg/dL (ref <150)
VLDL: 18 mg/dL (ref 0–40)

## 2021-02-24 LAB — COMPREHENSIVE METABOLIC PANEL
ALT: 58 U/L — ABNORMAL HIGH (ref 0–44)
AST: 28 U/L (ref 15–41)
Albumin: 2.4 g/dL — ABNORMAL LOW (ref 3.5–5.0)
Alkaline Phosphatase: 108 U/L (ref 38–126)
Anion gap: 4 — ABNORMAL LOW (ref 5–15)
BUN: 13 mg/dL (ref 8–23)
CO2: 23 mmol/L (ref 22–32)
Calcium: 7.9 mg/dL — ABNORMAL LOW (ref 8.9–10.3)
Chloride: 109 mmol/L (ref 98–111)
Creatinine, Ser: 0.66 mg/dL (ref 0.61–1.24)
GFR, Estimated: >60 mL/min (ref >60)
Glucose, Bld: 128 mg/dL — ABNORMAL HIGH (ref 70–99)
Potassium: 3.6 mmol/L (ref 3.5–5.1)
Sodium: 136 mmol/L (ref 135–145)
Total Bilirubin: 2.7 mg/dL — ABNORMAL HIGH (ref 0.3–1.2)
Total Protein: 5 g/dL — ABNORMAL LOW (ref 6.5–8.1)

## 2021-02-24 LAB — GLUCOSE, CAPILLARY
Glucose-Capillary: 102 mg/dL — ABNORMAL HIGH (ref 70–99)
Glucose-Capillary: 105 mg/dL — ABNORMAL HIGH (ref 70–99)
Glucose-Capillary: 106 mg/dL — ABNORMAL HIGH (ref 70–99)
Glucose-Capillary: 116 mg/dL — ABNORMAL HIGH (ref 70–99)
Glucose-Capillary: 132 mg/dL — ABNORMAL HIGH (ref 70–99)
Glucose-Capillary: 148 mg/dL — ABNORMAL HIGH (ref 70–99)
Glucose-Capillary: 93 mg/dL (ref 70–99)

## 2021-02-24 LAB — HEMOGLOBIN A1C
Hgb A1c MFr Bld: 6.7 % — ABNORMAL HIGH (ref 4.8–5.6)
Hgb A1c MFr Bld: 7 % — ABNORMAL HIGH (ref 4.8–5.6)
Mean Plasma Glucose: 146 mg/dL
Mean Plasma Glucose: 154 mg/dL

## 2021-02-24 LAB — MAGNESIUM: Magnesium: 2.1 mg/dL (ref 1.7–2.4)

## 2021-02-24 LAB — PROCALCITONIN: Procalcitonin: 8.93 ng/mL

## 2021-02-24 LAB — PHOSPHORUS: Phosphorus: 1.2 mg/dL — ABNORMAL LOW (ref 2.5–4.6)

## 2021-02-24 MED ORDER — POTASSIUM PHOSPHATES 15 MMOLE/5ML IV SOLN
30.0000 mmol | Freq: Once | INTRAVENOUS | Status: AC
  Administered 2021-02-24: 30 mmol via INTRAVENOUS
  Filled 2021-02-24: qty 10

## 2021-02-24 MED ORDER — IPRATROPIUM-ALBUTEROL 0.5-2.5 (3) MG/3ML IN SOLN
3.0000 mL | Freq: Four times a day (QID) | RESPIRATORY_TRACT | Status: DC
  Administered 2021-02-24 – 2021-02-25 (×3): 3 mL via RESPIRATORY_TRACT
  Filled 2021-02-24 (×3): qty 3

## 2021-02-24 NOTE — Plan of Care (Signed)
  Problem: Clinical Measurements: Goal: Will remain free from infection Outcome: Progressing   Problem: Safety: Goal: Ability to remain free from injury will improve Outcome: Progressing  Pt maintained hand mittens , remained calm for the night .Sitter present .

## 2021-02-24 NOTE — Plan of Care (Signed)

## 2021-02-24 NOTE — Consult Note (Signed)
[facility] Infectious Disease  Total days of antibiotics 3       Reason for Consult: e.coli bacteremia    Referring Physician: woods  Principal Problem:   Ascending cholangitis Active Problems:   Diabetes mellitus type 2, diet-controlled ([place])   Vascular dementia without behavioral disturbance ([place])   Choledocholithiasis with acute cholecystitis with obstruction   E coli bacteremia   PAF (paroxysmal atrial fibrillation) (HCC)   Acute gallstone pancreatitis   Abnormal radiologic findings on diagnostic imaging of renal pelvis, ureter, or bladder   Sepsis due to Escherichia coli (E. coli) (HCC)    HPI: Hector Lucero is a 87 y.o. male with hx of diet contrlled DM, HTN, SVT who was admitted to [facility] on 4/16 with diffuse abdominal pain with vomiting, chills, weakness x 2 days prior to admit. He was found to have wbc of 5.4K but 91%N, procal of 21.7, lipase of 1,247, AST 142, ALT 156, tbili 8.6 as well as abd GI imaging- GB wall thickening, with associated CBD and intrahepatic biliary ductal dilatation. No PD dilatation nor pancreatitis. He was started on broad spectrum abtx. His infectious work up revealed pan-sensitive e.coli bacteremia. he Had successful ERCP with biliary stone extraction on 4/16 for cholangitis.His transaminitis and bilirubin slowly trending down back within normal range..temporary PD stent placed. GI is still recommending surgical evaluation. Patient since procedure still remains somewhat encephalopathic but is able to follow commands.    Past Medical History:  Diagnosis Date  . Atrial fibrillation ([place])   . Diabetes mellitus without complication ([place])   . Fatigue   . HLD (hyperlipidemia)   . Hypertension   . OSA (obstructive sleep apnea)   . Radial fracture 2006   right, screws and plates  . SVT (supraventricular tachycardia) ([place]) 2012   a. Holter in 2012 showed 27 episodes of SVT/atrial tach w/ longest being 36 beats; b. asymptomatic  . Syncope  08/2015   a. short rhythm strip in [facility] ED showed junctional escape rhythm and AV dissociation with essentially sinus bradycardia  . Ulnar fracture 1995   fall off of ladder    Allergies: No Known Allergies  MEDICATIONS: . diltiazem  30 mg Oral Q6H  . insulin aspart  0-9 Units Subcutaneous Q4H  . ipratropium-albuterol  3 mL Nebulization Q6H  . QUEtiapine  25 mg Oral Daily    Social History   Tobacco Use  . Smoking status: Never Smoker  . Smokeless tobacco: Never Used  Substance Use Topics  . Alcohol use: Yes    Alcohol/week: 3.0 standard drinks    Types: 3 Glasses of wine per week    Comment: occasional  . Drug use: No    Family History  Problem Relation Age of Onset  . Diabetes Mother     Review of Systems  Constitutional: Negative for fever, chills, diaphoresis, activity change, appetite change, fatigue and unexpected weight change.  HENT: Negative for congestion, sore throat, rhinorrhea, sneezing, trouble swallowing and sinus pressure.  Eyes: Negative for photophobia and visual disturbance.  Respiratory: Negative for cough, chest tightness, shortness of breath, wheezing and stridor.  Cardiovascular: Negative for chest pain, palpitations and leg swelling.  Gastrointestinal: + abdominal pain. But now Negative for nausea, vomiting, abdominal pain, diarrhea, constipation, blood in stool, abdominal distention and anal bleeding.  Genitourinary: Negative for dysuria, hematuria, flank pain and difficulty urinating.  Musculoskeletal: Negative for myalgias, back pain, joint swelling, arthralgias and gait problem.  Skin: Negative for color change, pallor, rash  and wound.  Neurological: Negative for dizziness, tremors, weakness and light-headedness.  Hematological: Negative for adenopathy. Does not bruise/bleed easily.  Psychiatric/Behavioral: Negative for behavioral problems, confusion, sleep disturbance, dysphoric mood, decreased concentration and agitation.      OBJECTIVE: Temp:  [97.9 F (36.6 C)-98.9 F (37.2 C)] 98.7 F (37.1 C) (04/18 1538) Pulse Rate:  [68-81] 71 (04/18 1400) Resp:  [13-22] 15 (04/18 1400) BP: (92-135)/(53-80) 132/69 (04/18 1538) SpO2:  [93 %-98 %] 97 % (04/18 1400) Physical Exam  Constitutional: He is oriented to person. He appears well-developed and well-nourished. No distress.  HENT:  Mouth/Throat: Oropharynx is clear and moist. No oropharyngeal exudate.  Cardiovascular: Normal rate, regular rhythm and normal heart sounds. Exam reveals no gallop and no friction rub.  No murmur heard.  Pulmonary/Chest: Effort normal and breath sounds normal. No respiratory distress. He has no wheezes.  Abdominal: Soft. Bowel sounds are normal. He exhibits no distension. Only slight tenderness to RUQ region Lymphadenopathy:  He has no cervical adenopathy.  Neurological: He is alert and oriented to person, only. Wearing mitts Skin: Skin is warm and dry. No rash noted. No erythema.  Psychiatric: answering questions correctly 50% of the time  LABS: Results for orders placed or performed during the hospital encounter of 02/22/21 (from the past 48 hour(s))  Glucose, capillary     Status: Abnormal   Collection Time: 02/22/21  7:51 PM  Result Value Ref Range   Glucose-Capillary 163 (H) 70 - 99 mg/dL    Comment: Glucose reference range applies only to samples taken after fasting for at least 8 hours.  Glucose, capillary     Status: Abnormal   Collection Time: 02/22/21 11:13 PM  Result Value Ref Range   Glucose-Capillary 148 (H) 70 - 99 mg/dL    Comment: Glucose reference range applies only to samples taken after fasting for at least 8 hours.  Comprehensive metabolic panel     Status: Abnormal   Collection Time: 02/23/21 12:40 AM  Result Value Ref Range   Sodium 134 (L) 135 - 145 mmol/L   Potassium 3.6 3.5 - 5.1 mmol/L   Chloride 104 98 - 111 mmol/L   CO2 23 22 - 32 mmol/L   Glucose, Bld 146 (H) 70 - 99 mg/dL    Comment:  Glucose reference range applies only to samples taken after fasting for at least 8 hours.   BUN 11 8 - 23 mg/dL   Creatinine, Ser 0.77 0.61 - 1.24 mg/dL   Calcium 8.1 (L) 8.9 - 10.3 mg/dL   Total Protein 5.3 (L) 6.5 - 8.1 g/dL   Albumin 2.5 (L) 3.5 - 5.0 g/dL   AST 47 (H) 15 - 41 U/L   ALT 81 (H) 0 - 44 U/L   Alkaline Phosphatase 127 (H) 38 - 126 U/L   Total Bilirubin 5.5 (H) 0.3 - 1.2 mg/dL   GFR, Estimated >60 >60 mL/min    Comment: (NOTE) Calculated using the CKD-EPI Creatinine Equation (2021)    Anion gap 7 5 - 15    Comment: Performed at [facility], [place] [address], [place], [place] [address]  Magnesium     Status: None   Collection Time: 02/23/21 12:40 AM  Result Value Ref Range   Magnesium 2.0 1.7 - 2.4 mg/dL    Comment: Performed at [facility] [address] [place], [place] [address]  Phosphorus     Status: Abnormal   Collection Time: 02/23/21 12:40 AM  Result Value Ref Range  Phosphorus 2.0 (L) 2.5 - 4.6 mg/dL    Comment: Performed at [facility] [address], [place], [place] [address]  CBC with Differential/Platelet     Status: Abnormal   Collection Time: 02/23/21 12:40 AM  Result Value Ref Range   WBC 8.4 4.0 - 10.5 K/uL   RBC 3.98 (L) 4.22 - 5.81 MIL/uL   Hemoglobin 11.6 (L) 13.0 - 17.0 g/dL   HCT 35.9 (L) 39.0 - 52.0 %   MCV 90.2 80.0 - 100.0 fL   MCH 29.1 26.0 - 34.0 pg   MCHC 32.3 30.0 - 36.0 g/dL   RDW 14.4 11.5 - 15.5 %   Platelets 175 150 - 400 K/uL   nRBC 0.0 0.0 - 0.2 %   Neutrophils Relative % 86 %   Neutro Abs 7.2 1.7 - 7.7 K/uL   Lymphocytes Relative 8 %   Lymphs Abs 0.7 0.7 - 4.0 K/uL   Monocytes Relative 6 %   Monocytes Absolute 0.5 0.1 - 1.0 K/uL   Eosinophils Relative 0 %   Eosinophils Absolute 0.0 0.0 - 0.5 K/uL   Basophils Relative 0 %   Basophils Absolute 0.0 0.0 - 0.1 K/uL   Immature Granulocytes 0 %   Abs Immature Granulocytes 0.03 0.00 - 0.07 K/uL    Comment: Performed at [facility]  [address], [place], [place] [address]  Procalcitonin     Status: None   Collection Time: 02/23/21 12:40 AM  Result Value Ref Range   Procalcitonin 15.38 ng/mL    Comment:        Interpretation: PCT >= 10 ng/mL: Important systemic inflammatory response, almost exclusively due to severe bacterial sepsis or septic shock. (NOTE)       Sepsis PCT Algorithm           Lower Respiratory Tract                                      Infection PCT Algorithm    ----------------------------     ----------------------------         PCT < 0.25 ng/mL                PCT < 0.10 ng/mL          Strongly encourage             Strongly discourage   discontinuation of antibiotics    initiation of antibiotics    ----------------------------     -----------------------------       PCT 0.25 - 0.50 ng/mL            PCT 0.10 - 0.25 ng/mL               OR       >80% decrease in PCT            Discourage initiation of                                            antibiotics      Encourage discontinuation           of antibiotics    ----------------------------     -----------------------------         PCT >= 0.50 ng/mL  PCT 0.26 - 0.50 ng/mL                AND       <80% decrease in PCT             Encourage initiation of                                             antibiotics       Encourage continuation           of antibiotics    ----------------------------     -----------------------------        PCT >= 0.50 ng/mL                  PCT > 0.50 ng/mL               AND         increase in PCT                  Strongly encourage                                      initiation of antibiotics    Strongly encourage escalation           of antibiotics                                     -----------------------------                                           PCT <= 0.25 ng/mL                                                 OR                                        > 80% decrease in PCT                                       Discontinue / Do not initiate                                             antibiotics  Performed at [facility], [address] [place], [place] [address]   Potassium     Status: None   Collection Time: 02/23/21 12:40 AM  Result Value Ref Range   Potassium 3.6 3.5 - 5.1 mmol/L    Comment: Performed at [facility] [address] [place], [place] [address]  Magnesium     Status: None   Collection Time: 02/23/21 12:40 AM  Result Value Ref Range  Magnesium 2.0 1.7 - 2.4 mg/dL    Comment: Performed at [facility], [place] [address], [place], [place] [address]  TSH     Status: None   Collection Time: 02/23/21 12:40 AM  Result Value Ref Range   TSH 0.359 0.350 - 4.500 uIU/mL    Comment: Performed by a 3rd Generation assay with a functional sensitivity of <=0.01 uIU/mL. Performed at [facility], [place] [address] [place], [place] [address]   Glucose, capillary     Status: Abnormal   Collection Time: 02/23/21  4:03 AM  Result Value Ref Range   Glucose-Capillary 142 (H) 70 - 99 mg/dL    Comment: Glucose reference range applies only to samples taken after fasting for at least 8 hours.  Glucose, capillary     Status: Abnormal   Collection Time: 02/23/21  7:50 AM  Result Value Ref Range   Glucose-Capillary 150 (H) 70 - 99 mg/dL    Comment: Glucose reference range applies only to samples taken after fasting for at least 8 hours.   Comment 1 Notify RN   Glucose, capillary     Status: Abnormal   Collection Time: 02/23/21 11:43 AM  Result Value Ref Range   Glucose-Capillary 165 (H) 70 - 99 mg/dL    Comment: Glucose reference range applies only to samples taken after fasting for at least 8 hours.   Comment 1 Notify RN   Glucose, capillary     Status: Abnormal   Collection Time: 02/23/21  3:30 PM  Result Value Ref Range   Glucose-Capillary 144 (H) 70 - 99 mg/dL    Comment: Glucose reference range applies only to samples taken after fasting for at  least 8 hours.  Glucose, capillary     Status: Abnormal   Collection Time: 02/23/21  7:59 PM  Result Value Ref Range   Glucose-Capillary 165 (H) 70 - 99 mg/dL    Comment: Glucose reference range applies only to samples taken after fasting for at least 8 hours.  Comprehensive metabolic panel     Status: Abnormal   Collection Time: 02/24/21 12:44 AM  Result Value Ref Range   Sodium 136 135 - 145 mmol/L   Potassium 3.6 3.5 - 5.1 mmol/L   Chloride 109 98 - 111 mmol/L   CO2 23 22 - 32 mmol/L   Glucose, Bld 128 (H) 70 - 99 mg/dL    Comment: Glucose reference range applies only to samples taken after fasting for at least 8 hours.   BUN 13 8 - 23 mg/dL   Creatinine, Ser 0.66 0.61 - 1.24 mg/dL   Calcium 7.9 (L) 8.9 - 10.3 mg/dL   Total Protein 5.0 (L) 6.5 - 8.1 g/dL   Albumin 2.4 (L) 3.5 - 5.0 g/dL   AST 28 15 - 41 U/L   ALT 58 (H) 0 - 44 U/L   Alkaline Phosphatase 108 38 - 126 U/L   Total Bilirubin 2.7 (H) 0.3 - 1.2 mg/dL   GFR, Estimated >60 >60 mL/min    Comment: (NOTE) Calculated using the CKD-EPI Creatinine Equation (2021)    Anion gap 4 (L) 5 - 15    Comment: Performed at [facility], [place] [address], [place], [place] [address]  Magnesium     Status: None   Collection Time: 02/24/21 12:44 AM  Result Value Ref Range   Magnesium 2.1 1.7 - 2.4 mg/dL    Comment: Performed at [facility] [address] [place], [place]  [address]  Phosphorus     Status: Abnormal   Collection Time: 02/24/21 12:44 AM  Result Value Ref Range   Phosphorus 1.2 (L) 2.5 - 4.6 mg/dL    Comment: Performed at [facility] [address], [place], [place] [address]  CBC with Differential/Platelet     Status: Abnormal   Collection Time: 02/24/21 12:44 AM  Result Value Ref Range   WBC 8.9 4.0 - 10.5 K/uL   RBC 3.70 (L) 4.22 - 5.81 MIL/uL   Hemoglobin 10.8 (L) 13.0 - 17.0 g/dL   HCT 32.4 (L) 39.0 - 52.0 %   MCV 87.6 80.0 - 100.0 fL   MCH 29.2 26.0 - 34.0 pg   MCHC 33.3 30.0 - 36.0 g/dL    RDW 14.4 11.5 - 15.5 %   Platelets 184 150 - 400 K/uL   nRBC 0.0 0.0 - 0.2 %   Neutrophils Relative % 81 %   Neutro Abs 7.2 1.7 - 7.7 K/uL   Lymphocytes Relative 10 %   Lymphs Abs 0.9 0.7 - 4.0 K/uL   Monocytes Relative 8 %   Monocytes Absolute 0.7 0.1 - 1.0 K/uL   Eosinophils Relative 0 %   Eosinophils Absolute 0.0 0.0 - 0.5 K/uL   Basophils Relative 0 %   Basophils Absolute 0.0 0.0 - 0.1 K/uL   Immature Granulocytes 1 %   Abs Immature Granulocytes 0.05 0.00 - 0.07 K/uL    Comment: Performed at [facility] [address], [place], [place] [address]  Procalcitonin     Status: None   Collection Time: 02/24/21 12:44 AM  Result Value Ref Range   Procalcitonin 8.93 ng/mL    Comment:        Interpretation: PCT > 2 ng/mL: Systemic infection (sepsis) is likely, unless other causes are known. (NOTE)       Sepsis PCT Algorithm           Lower Respiratory Tract                                      Infection PCT Algorithm    ----------------------------     ----------------------------         PCT < 0.25 ng/mL                PCT < 0.10 ng/mL          Strongly encourage             Strongly discourage   discontinuation of antibiotics    initiation of antibiotics    ----------------------------     -----------------------------       PCT 0.25 - 0.50 ng/mL            PCT 0.10 - 0.25 ng/mL               OR       >80% decrease in PCT            Discourage initiation of                                            antibiotics      Encourage discontinuation           of antibiotics    ----------------------------     -----------------------------  PCT >= 0.50 ng/mL              PCT 0.26 - 0.50 ng/mL               AND       <80% decrease in PCT              Encourage initiation of                                             antibiotics       Encourage continuation           of antibiotics    ----------------------------     -----------------------------        PCT >= 0.50  ng/mL                  PCT > 0.50 ng/mL               AND         increase in PCT                  Strongly encourage                                      initiation of antibiotics    Strongly encourage escalation           of antibiotics                                     -----------------------------                                           PCT <= 0.25 ng/mL                                                 OR                                        > 80% decrease in PCT                                      Discontinue / Do not initiate                                             antibiotics  Performed at [facility], [address] [place], [place] [address]   Lipid panel     Status: None   Collection Time: 02/24/21 12:44 AM  Result Value Ref Range   Cholesterol 154 0 - 200 mg/dL   Triglycerides 91 <150 mg/dL   HDL 44 >40 mg/dL   Total CHOL/HDL Ratio 3.5 RATIO  VLDL 18 0 - 40 mg/dL   LDL Cholesterol 92 0 - 99 mg/dL    Comment:        Total Cholesterol/HDL:CHD Risk Coronary Heart Disease Risk Table                     Men   Women  1/2 Average Risk   3.4   3.3  Average Risk       5.0   4.4  2 X Average Risk   9.6   7.1  3 X Average Risk  23.4   11.0        Use the calculated Patient Ratio above and the CHD Risk Table to determine the patient's CHD Risk.        ATP III CLASSIFICATION (LDL):  <100     mg/dL   Optimal  100-129  mg/dL   Near or Above                    Optimal  130-159  mg/dL   Borderline  160-189  mg/dL   High  >190     mg/dL   Very High Performed at [facility] [address] [place], [place] [address]   Glucose, capillary     Status: Abnormal   Collection Time: 02/24/21 12:44 AM  Result Value Ref Range   Glucose-Capillary 148 (H) 70 - 99 mg/dL    Comment: Glucose reference range applies only to samples taken after fasting for at least 8 hours.  Glucose, capillary     Status: Abnormal   Collection Time: 02/24/21  3:40 AM  Result  Value Ref Range   Glucose-Capillary 106 (H) 70 - 99 mg/dL    Comment: Glucose reference range applies only to samples taken after fasting for at least 8 hours.  Glucose, capillary     Status: Abnormal   Collection Time: 02/24/21  7:52 AM  Result Value Ref Range   Glucose-Capillary 102 (H) 70 - 99 mg/dL    Comment: Glucose reference range applies only to samples taken after fasting for at least 8 hours.  Glucose, capillary     Status: None   Collection Time: 02/24/21 11:25 AM  Result Value Ref Range   Glucose-Capillary 93 70 - 99 mg/dL    Comment: Glucose reference range applies only to samples taken after fasting for at least 8 hours.    MICRO: 4/15 blood cx e.coli (pansensitive) IMAGING: DG Abd 1 View  Result Date: 02/24/2021 CLINICAL DATA:  Pancreatic stent migration EXAM: ABDOMEN - 1 VIEW COMPARISON:  ERCP 02/22/2021 FINDINGS: Bowel gas pattern is within normal limits. I do not identify a pancreatic or biliary stent. No stent is visible within the intestine in any other location. Chronic curvature in degenerative change affects the spine. Aortic atherosclerotic calcification is present. IMPRESSION: No stent is identified either in situ or migrated. Electronically Signed   By: Nelson Chimes M.D.   On: 02/24/2021 09:09   Assessment/Plan:  87yo M admitted for ascending cholangitis due to CGD stones with secondary e.coli bacteremia. S/p ERCP, sphincterotomy, biliary stone extraction. Currently on ceftriaxone.   Cholangitis with ecoli bacteremia = plan to treat for additional 4 days( for a total of 7 days). Can narrow to ampicillin  transaminitis = trending down since intervention  Migrated PD stent = per GI, appears that it was temporary stent.  Encephalopathy = multifactorial. Hopefully to improve with treatment of underlying infection, avoiding sundowning  Health maintenance =  once he is more lucid, it would be worth asking how many covid vaccine doses he has received, and offer  booster if he has not already done so.

## 2021-02-24 NOTE — Plan of Care (Signed)

## 2021-02-24 NOTE — Progress Notes (Addendum)
PROGRESS NOTE    Hector FRIEDT  Lucero:[id number] DOB: 30-Nov-1927 DOA: 02/22/2021 PCP: Venia Carbon, MD     Brief Narrative:  Hector Lucero is a 87 y.o. WM PMHx Paroxysmal Atrial Fibrillation, SVT, HTN, DM type II diet controlled.  HLD, OSA,  Presents to ED at [facility] with c/o abd pain.  Onset 2 days ago, progressively worsening, diffuse, associated nausea.  Has had vomiting and constipation.  Also chills and generalized weakness, mod difficulty walking due to weakness.   ED Course: Pt found to have Tm 104.2, have ascending cholangitis with 53mm CBD stone on MRCP.  Started on cefepime, flagyl, vanc empirically (before he was found to have E.Coli bacteremia on culture that has since resulted).  Dr. Hilarie Fredrickson consulted (see note).  While in ED pt also apparently went into SVT, couldn't convert with adenosine so got multiple rounds of Cardizem which converted him to NSR.  Lipase over 1k.  Got 2L IVF bolus.  At this time pt denies any pain (specifically denies abd pain), says "I never did have any".  Unfortunately patient seems to have developed delirium since being in the ED yesterday afternoon.  Pt not oriented to location nor time.  Thinks he is "in a school".  Pt seems to be pleasantly confused at the moment.  Is redirectable.  Subjective: 4/18 afebrile overnight A/O x1 (does not know where, when, why) does not follow commands   Assessment & Plan: Covid vaccination;   Principal Problem:   Ascending cholangitis Active Problems:   Diabetes mellitus type 2, diet-controlled ([place])   Vascular dementia without behavioral disturbance ([place])   Choledocholithiasis with acute cholecystitis with obstruction   E coli bacteremia   PAF (paroxysmal atrial fibrillation) (HCC)   Acute gallstone pancreatitis   Abnormal radiologic findings on diagnostic imaging of renal pelvis, ureter, or bladder   Sepsis due to Escherichia coli (E. coli) ([place])  Choledocholithiasis with acute  cholecystitis with obstruction -20mm CBD stone requiring ERCP - 4/16 s/p ERCP see results below - Continue current antibiotics for total of 7 days per ID recommendation - 4/17 GI signed off but recommended surgical consult for consideration of cholecystectomy - Will allow patient to complete at least 44 to 72 hours of antibiotics before consulting surgery given his bacteremia and acute metabolic mental status change. -4/18 we will consult surgery in the a.m. but doubt they will let to perform surgery, patient is a very poor surgical candidate  Sepsis - Upon admission patient does not meet sepsis criteria. -Multifactorial cholecystitis and bacteremia  Bacteremia positive E. Coli - Continue 2-week course antibiotics -Trend procalcitonin Results for LINCON, SAHLIN (MRN [id number]) as of 02/24/2021 11:13  Ref. Range 02/22/2021 08:52 02/23/2021 00:40 02/24/2021 00:44  Procalcitonin Latest Units: ng/mL 21.76 15.38 8.93    Lactic acidosis -Resolved  Hypokalemia - Potassium goal> 4 - 4/18 K-Phos 30 mmol  Hypophosphatemia - Phosphorus goal> 5 - See hypokalemia  Delirium/ Agitation  -4/17 soft restraints - Haldol PRN -Seroquel 25 mg daily  Diabetes type 2 controlled without complication - 9/56 hemoglobin A1c= 7.0 - Sensitive SSI   HLD -4/18 LDL= 92  Dysphagia? - Patient coughing after consuming breakfast, possible aspiration. - 4/18 consult for swallow evaluation -4/18 DuoNeb QID - Incentive spirometer (patient probably will not be able to cooperate) -Flutter valve  (patient probably will not be able to cooperate)   DVT prophylaxis: SCD Code Status: DNR Family Communication: 4/18 spoke with [name] [facility]) HCPOA discussed plan of care answered all  questions Status is: Inpatient    Dispo: The patient is from: Home              Anticipated d/c is to: Home              Anticipated d/c date is: 4/20              Patient currently unstable      Consultants:  Dr.  Hilarie Fredrickson GI Dr. Carlyle Basques, ID   Procedures/Significant Events:  4/16 ERCP;-Choledocholithiasis was found. Complete removal  was accomplished by biliary sphincterotomy and  balloon extraction. -One plastic pancreatic stent was placed into the  ventral pancreatic duct. But no dye was injected into the pancreas - A precut biliary sphincterotomy was performed. - A needle-knife biliary sphincterotomy was performed. - A biliary sphincterotomy extension was performed. -The biliary tree was swept at the end of the procedure and nothing was found.    I have personally reviewed and interpreted all radiology studies and my findings are as above.  VENTILATOR SETTINGS: Nasal cannula 4/18 Flow 2 L/min SPO2 95%    Cultures 4/15 blood LEFT wrist positive E. coli 4/15 blood LEFT AC positive E. coli   Antimicrobials: Anti-infectives (From admission, onward)   Start     Ordered Stop   02/22/21 0400  cefTRIAXone (ROCEPHIN) 2 g in sodium chloride 0.9 % 100 mL IVPB        02/22/21 0354         Devices    LINES / TUBES:      Continuous Infusions: . cefTRIAXone (ROCEPHIN)  IV 2 g (02/24/21 0559)  . lactated ringers 125 mL/hr at 02/24/21 0409     Objective: Vitals:   02/24/21 0048 02/24/21 0300 02/24/21 0559 02/24/21 0800  BP: 120/80 127/74 135/74   Pulse:  81    Resp:  (!) 21    Temp:  97.9 F (36.6 C)  98.9 F (37.2 C)  TempSrc:  Oral  Axillary  SpO2:  94%    Weight:      Height:        Intake/Output Summary (Last 24 hours) at 02/24/2021 1110 Last data filed at 02/24/2021 0600 Gross per 24 hour  Intake 2469.07 ml  Output 950 ml  Net 1519.07 ml   Filed Weights   02/22/21 0216  Weight: 70.4 kg    Examination: Physical Exam:  General: A/O x1 (does not know where, when, why).  Does not follow commands Positive acute respiratory distress Eyes: negative scleral hemorrhage, negative anisocoria, negative icterus ENT: Negative Runny nose, negative gingival  bleeding, Neck:  Negative scars, masses, torticollis, lymphadenopathy, JVD Lungs: Clear to auscultation bilaterally without wheezes or crackles Cardiovascular: Regular rate and rhythm without murmur gallop or rub normal S1 and S2 Abdomen: negative abdominal pain, nondistended, positive soft, bowel sounds, no rebound, no ascites, no appreciable mass Extremities: No significant cyanosis, clubbing, or edema bilateral lower extremities Skin: Negative rashes, lesions, ulcers Psychiatric: Unable to appropriately evaluate secondary to delirium/agitation Central nervous system:  Cranial nerves II through XII intact, tongue/uvula midline, all extremities muscle strength 5/5, sensation intact throughout, negative dysarthria, negative expressive aphasia, negative receptive aphasia..     Data Reviewed: Care during the described time interval was provided by me .  I have reviewed this patient's available data, including medical history, events of note, physical examination, and all test results as part of my evaluation.  CBC: Recent Labs  Lab 02/21/21 1351 02/22/21 0451 02/23/21 0040 02/24/21 0044  WBC 5.4  6.7 8.4 8.9  NEUTROABS 4.9  --  7.2 7.2  HGB 12.5* 11.6* 11.6* 10.8*  HCT 38.1* 35.4* 35.9* 32.4*  MCV 86.8 89.8 90.2 87.6  PLT 189 171 175 481   Basic Metabolic Panel: Recent Labs  Lab 02/21/21 1351 02/22/21 0451 02/22/21 0852 02/23/21 0040 02/24/21 0044  NA 135 136  --  134* 136  K 2.9* 3.1*  --  3.6  3.6 3.6  CL 100 105  --  104 109  CO2 23 25  --  23 23  GLUCOSE 169* 126*  --  146* 128*  BUN 20 11  --  11 13  CREATININE 0.52* 0.75  --  0.77 0.66  CALCIUM 8.9 8.1*  --  8.1* 7.9*  MG  --   --  2.0 2.0  2.0 2.1  PHOS  --   --  2.6 2.0* 1.2*   GFR: Estimated Creatinine Clearance: 58.2 mL/min (by C-G formula based on SCr of 0.66 mg/dL). Liver Function Tests: Recent Labs  Lab 02/21/21 1351 02/22/21 0451 02/23/21 0040 02/24/21 0044  AST 142* 78* 47* 28  ALT 156* 105* 81*  58*  ALKPHOS 176* 128* 127* 108  BILITOT 8.6* 7.7* 5.5* 2.7*  PROT 7.0 5.2* 5.3* 5.0*  ALBUMIN 3.9 2.7* 2.5* 2.4*   Recent Labs  Lab 02/21/21 1351  LIPASE 1,247*   No results for input(s): AMMONIA in the last 168 hours. Coagulation Profile: Recent Labs  Lab 02/21/21 1410  INR 1.2   Cardiac Enzymes: No results for input(s): CKTOTAL, CKMB, CKMBINDEX, TROPONINI in the last 168 hours. BNP (last 3 results) No results for input(s): PROBNP in the last 8760 hours. HbA1C: Recent Labs    02/22/21 0451  HGBA1C 7.0*   CBG: Recent Labs  Lab 02/23/21 1530 02/23/21 1959 02/24/21 0044 02/24/21 0340 02/24/21 0752  GLUCAP 144* 165* 148* 106* 102*   Lipid Profile: Recent Labs    02/24/21 0044  CHOL 154  HDL 44  LDLCALC 92  TRIG 91  CHOLHDL 3.5   Thyroid Function Tests: Recent Labs    02/23/21 0040  TSH 0.359   Anemia Panel: No results for input(s): VITAMINB12, FOLATE, FERRITIN, TIBC, IRON, RETICCTPCT in the last 72 hours. Sepsis Labs: Recent Labs  Lab 02/21/21 1351 02/21/21 1732 02/22/21 0852 02/22/21 1428 02/23/21 0040 02/24/21 0044  PROCALCITON  --   --  21.76  --  15.38 8.93  LATICACIDVEN 2.2* 1.5 1.3 1.2  --   --     Recent Results (from the past 240 hour(s))  Blood Culture (routine x 2)     Status: Abnormal   Collection Time: 02/21/21  1:51 PM   Specimen: BLOOD  Result Value Ref Range Status   Specimen Description   Final    BLOOD BLOOD LEFT WRIST Performed at [facility], [address] [place], [place] [address]    Special Requests   Final    BOTTLES DRAWN AEROBIC AND ANAEROBIC Blood Culture adequate volume Performed at [facility], [address] [place], [place] [address]    Culture  Setup Time   Final    GRAM NEGATIVE RODS IN BOTH AEROBIC AND ANAEROBIC BOTTLES CRITICAL VALUE NOTED.  VALUE IS CONSISTENT WITH PREVIOUSLY REPORTED AND CALLED VALUE. Performed at [facility], [address] [place], [place]  [address]    Culture (A)  Final    ESCHERICHIA COLI SUSCEPTIBILITIES PERFORMED ON PREVIOUS CULTURE WITHIN THE LAST 5 DAYS. Performed at [facility], [address] [place], [place] [address]    Report Status 02/24/2021 FINAL  Final  Resp Panel by RT-PCR (Flu A&B, Covid) Nasopharyngeal Swab     Status: None   Collection Time: 02/21/21  2:10 PM   Specimen: Nasopharyngeal Swab; Nasopharyngeal(NP) swabs in vial transport medium  Result Value Ref Range Status   SARS Coronavirus 2 by RT PCR NEGATIVE NEGATIVE Final    Comment: (NOTE) SARS-CoV-2 target nucleic acids are NOT DETECTED.  The SARS-CoV-2 RNA is generally detectable in upper respiratory specimens during the acute phase of infection. The lowest concentration of SARS-CoV-2 viral copies this assay can detect is 138 copies/mL. A negative result does not preclude SARS-Cov-2 infection and should not be used as the sole basis for treatment or other patient management decisions. A negative result may occur with  improper specimen collection/handling, submission of specimen other than nasopharyngeal swab, presence of viral mutation(s) within the areas targeted by this assay, and inadequate number of viral copies(<138 copies/mL). A negative result must be combined with clinical observations, patient history, and epidemiological information. The expected result is Negative.  Fact Sheet for Patients:  EntrepreneurPulse.com.au  Fact Sheet for Healthcare Providers:  IncredibleEmployment.be  This test is no t yet approved or cleared by the [place] FDA and  has been authorized for detection and/or diagnosis of SARS-CoV-2 by FDA under an Emergency Use Authorization (EUA). This EUA will remain  in effect (meaning this test can be used) for the duration of the COVID-19 declaration under Section 564(b)(1) of the Act, 21 U.S.C.section 360bbb-3(b)(1), unless the authorization is terminated  or  revoked sooner.       Influenza A by PCR NEGATIVE NEGATIVE Final   Influenza B by PCR NEGATIVE NEGATIVE Final    Comment: (NOTE) The Xpert Xpress SARS-CoV-2/FLU/RSV plus assay is intended as an aid in the diagnosis of influenza from Nasopharyngeal swab specimens and should not be used as a sole basis for treatment. Nasal washings and aspirates are unacceptable for Xpert Xpress SARS-CoV-2/FLU/RSV testing.  Fact Sheet for Patients: EntrepreneurPulse.com.au  Fact Sheet for Healthcare Providers: IncredibleEmployment.be  This test is not yet approved or cleared by the [place] FDA and has been authorized for detection and/or diagnosis of SARS-CoV-2 by FDA under an Emergency Use Authorization (EUA). This EUA will remain in effect (meaning this test can be used) for the duration of the COVID-19 declaration under Section 564(b)(1) of the Act, 21 U.S.C. section 360bbb-3(b)(1), unless the authorization is terminated or revoked.  Performed at [facility], [address] [place], [place] [address]   Blood Culture (routine x 2)     Status: Abnormal   Collection Time: 02/21/21  2:10 PM   Specimen: BLOOD  Result Value Ref Range Status   Specimen Description   Final    BLOOD LEFT ANTECUBITAL Performed at [facility], [address], [place], [place] [address]    Special Requests   Final    BOTTLES DRAWN AEROBIC AND ANAEROBIC Blood Culture adequate volume Performed at [facility], [place], [place] [address]    Culture  Setup Time   Final    GRAM NEGATIVE RODS IN BOTH AEROBIC AND ANAEROBIC BOTTLES CRITICAL RESULT CALLED TO, READ BACK BY AND VERIFIED WITH: Winfield Rast PATEL ON 02/22/21 AT 0017 MF Performed at [facility], [place] [address], [place],  [address]    Culture ESCHERICHIA COLI (A)  Final   Report Status 02/24/2021 FINAL  Final   Organism ID,  Bacteria ESCHERICHIA COLI  Final       Susceptibility   Escherichia coli - MIC*    AMPICILLIN <=2 SENSITIVE Sensitive     CEFAZOLIN <=4 SENSITIVE Sensitive     CEFEPIME <=0.12 SENSITIVE Sensitive     CEFTAZIDIME <=1 SENSITIVE Sensitive     CEFTRIAXONE <=0.25 SENSITIVE Sensitive     CIPROFLOXACIN <=0.25 SENSITIVE Sensitive     GENTAMICIN <=1 SENSITIVE Sensitive     IMIPENEM <=0.25 SENSITIVE Sensitive     TRIMETH/SULFA <=20 SENSITIVE Sensitive     AMPICILLIN/SULBACTAM <=2 SENSITIVE Sensitive     PIP/TAZO <=4 SENSITIVE Sensitive     * ESCHERICHIA COLI  Urine culture     Status: None   Collection Time: 02/21/21  2:10 PM   Specimen: In/Out Cath Urine  Result Value Ref Range Status   Specimen Description   Final    IN/OUT CATH URINE Performed at [facility], [address] [place], [place] [address]    Special Requests   Final    NONE Performed at [facility], [address] [place], [place] [address]    Culture   Final    NO GROWTH Performed at [facility], [place] [address] [place], [place] [address]    Report Status 02/23/2021 FINAL  Final  Blood Culture ID Panel (Reflexed)     Status: Abnormal   Collection Time: 02/21/21  2:10 PM  Result Value Ref Range Status   Enterococcus faecalis NOT DETECTED NOT DETECTED Final   Enterococcus Faecium NOT DETECTED NOT DETECTED Final   Listeria monocytogenes NOT DETECTED NOT DETECTED Final   Staphylococcus species NOT DETECTED NOT DETECTED Final   Staphylococcus aureus (BCID) NOT DETECTED NOT DETECTED Final   Staphylococcus epidermidis NOT DETECTED NOT DETECTED Final   Staphylococcus lugdunensis NOT DETECTED NOT DETECTED Final   Streptococcus species NOT DETECTED NOT DETECTED Final   Streptococcus agalactiae NOT DETECTED NOT DETECTED Final   Streptococcus pneumoniae NOT DETECTED NOT DETECTED Final   Streptococcus pyogenes NOT DETECTED NOT DETECTED Final   A.calcoaceticus-baumannii NOT DETECTED NOT DETECTED Final   Bacteroides fragilis NOT DETECTED  NOT DETECTED Final   Enterobacterales DETECTED (A) NOT DETECTED Final    Comment: Enterobacterales represent a large order of gram negative bacteria, not a single organism. CRITICAL RESULT CALLED TO, READ BACK BY AND VERIFIED WITH: Winfield Rast LEPLAT AT 5/02/22/22 MF    Enterobacter cloacae complex NOT DETECTED NOT DETECTED Final   Escherichia coli DETECTED (A) NOT DETECTED Final    Comment: CRITICAL RESULT CALLED TO, READ BACK BY AND VERIFIED WITH: KISHAN LEPLAT AT 0016 02/22/21 MF    Klebsiella aerogenes NOT DETECTED NOT DETECTED Final   Klebsiella oxytoca NOT DETECTED NOT DETECTED Final   Klebsiella pneumoniae NOT DETECTED NOT DETECTED Final   Proteus species NOT DETECTED NOT DETECTED Final   Salmonella species NOT DETECTED NOT DETECTED Final   Serratia marcescens NOT DETECTED NOT DETECTED Final   Haemophilus influenzae NOT DETECTED NOT DETECTED Final   Neisseria meningitidis NOT DETECTED NOT DETECTED Final   Pseudomonas aeruginosa NOT DETECTED NOT DETECTED Final   Stenotrophomonas maltophilia NOT DETECTED NOT DETECTED Final   Candida albicans NOT DETECTED NOT DETECTED Final   Candida auris NOT DETECTED NOT DETECTED Final   Candida glabrata NOT DETECTED NOT DETECTED Final   Candida krusei NOT DETECTED NOT DETECTED Final   Candida parapsilosis NOT DETECTED NOT DETECTED Final   Candida tropicalis NOT DETECTED NOT DETECTED Final   Cryptococcus neoformans/gattii NOT DETECTED NOT  DETECTED Final   CTX-M ESBL NOT DETECTED NOT DETECTED Final   Carbapenem resistance IMP NOT DETECTED NOT DETECTED Final   Carbapenem resistance KPC NOT DETECTED NOT DETECTED Final   Carbapenem resistance NDM NOT DETECTED NOT DETECTED Final   Carbapenem resist OXA 48 LIKE NOT DETECTED NOT DETECTED Final   Carbapenem resistance VIM NOT DETECTED NOT DETECTED Final    Comment: Performed at [facility], [address] [place], [place] [address]  MRSA PCR Screening     Status: None   Collection Time:  02/22/21  4:24 AM   Specimen: Nasopharyngeal  Result Value Ref Range Status   MRSA by PCR NEGATIVE NEGATIVE Final    Comment:        The GeneXpert MRSA Assay (FDA approved for NASAL specimens only), is one component of a comprehensive MRSA colonization surveillance program. It is not intended to diagnose MRSA infection nor to guide or monitor treatment for MRSA infections. Performed at [facility], [place] [address], [place], [place] [address]          Radiology Studies: DG Abd 1 View  Result Date: 02/24/2021 CLINICAL DATA:  Pancreatic stent migration EXAM: ABDOMEN - 1 VIEW COMPARISON:  ERCP 02/22/2021 FINDINGS: Bowel gas pattern is within normal limits. I do not identify a pancreatic or biliary stent. No stent is visible within the intestine in any other location. Chronic curvature in degenerative change affects the spine. Aortic atherosclerotic calcification is present. IMPRESSION: No stent is identified either in situ or migrated. Electronically Signed   By: Nelson Chimes M.D.   On: 02/24/2021 09:09   DG ERCP  Result Date: 02/22/2021 CLINICAL DATA:  Choledocholithiasis EXAM: ERCP TECHNIQUE: Multiple spot images obtained with the fluoroscopic device and submitted for interpretation post-procedure. FLUOROSCOPY TIME:  Fluoroscopy Time:  7 minutes 46 seconds Radiation Exposure Index (if provided by the fluoroscopic device): 34 mGy Number of Acquired Spot Images: 4 COMPARISON:  None. FINDINGS: Submitted intraoperative fluoroscopic images demonstrate cannulation and opacification of the common bile duct. The common bile duct is mildly dilated. A stent is noted adjacent to the common bile duct, possibly located within the pancreatic duct. IMPRESSION: Intraoperative fluoroscopic images of ERCP as above. These images were submitted for radiologic interpretation only. Please see the procedural report for the amount of contrast and the fluoroscopy time utilized. Electronically Signed   By:  Miachel Roux M.D.   On: 02/22/2021 13:54        Scheduled Meds: . diltiazem  30 mg Oral Q6H  . insulin aspart  0-9 Units Subcutaneous Q4H  . QUEtiapine  25 mg Oral Daily   Continuous Infusions: . cefTRIAXone (ROCEPHIN)  IV 2 g (02/24/21 0559)  . lactated ringers 125 mL/hr at 02/24/21 0409     LOS: 2 days    Time spent:5 min    Joaovictor Krone, Geraldo Docker, MD [facility]   If 7PM-7AM, please contact night-coverage 02/24/2021, 11:10 AM

## 2021-02-25 DIAGNOSIS — G9340 Encephalopathy, unspecified: Secondary | ICD-10-CM | POA: Diagnosis not present

## 2021-02-25 DIAGNOSIS — A4151 Sepsis due to Escherichia coli [E. coli]: Secondary | ICD-10-CM | POA: Diagnosis not present

## 2021-02-25 DIAGNOSIS — R7401 Elevation of levels of liver transaminase levels: Secondary | ICD-10-CM | POA: Diagnosis not present

## 2021-02-25 DIAGNOSIS — K8309 Other cholangitis: Secondary | ICD-10-CM | POA: Diagnosis not present

## 2021-02-25 LAB — CBC WITH DIFFERENTIAL/PLATELET
Abs Immature Granulocytes: 0.11 10*3/uL — ABNORMAL HIGH (ref 0.00–0.07)
Basophils Absolute: 0 10*3/uL (ref 0.0–0.1)
Basophils Relative: 0 %
Eosinophils Absolute: 0.2 10*3/uL (ref 0.0–0.5)
Eosinophils Relative: 2 %
HCT: 36.5 % — ABNORMAL LOW (ref 39.0–52.0)
Hemoglobin: 11.6 g/dL — ABNORMAL LOW (ref 13.0–17.0)
Immature Granulocytes: 1 %
Lymphocytes Relative: 19 %
Lymphs Abs: 1.5 10*3/uL (ref 0.7–4.0)
MCH: 28.7 pg (ref 26.0–34.0)
MCHC: 31.8 g/dL (ref 30.0–36.0)
MCV: 90.3 fL (ref 80.0–100.0)
Monocytes Absolute: 0.7 10*3/uL (ref 0.1–1.0)
Monocytes Relative: 9 %
Neutro Abs: 5.2 10*3/uL (ref 1.7–7.7)
Neutrophils Relative %: 69 %
Platelets: 201 10*3/uL (ref 150–400)
RBC: 4.04 MIL/uL — ABNORMAL LOW (ref 4.22–5.81)
RDW: 14.6 % (ref 11.5–15.5)
WBC: 7.7 10*3/uL (ref 4.0–10.5)
nRBC: 0 % (ref 0.0–0.2)

## 2021-02-25 LAB — COMPREHENSIVE METABOLIC PANEL
ALT: 53 U/L — ABNORMAL HIGH (ref 0–44)
AST: 33 U/L (ref 15–41)
Albumin: 2.3 g/dL — ABNORMAL LOW (ref 3.5–5.0)
Alkaline Phosphatase: 106 U/L (ref 38–126)
Anion gap: 6 (ref 5–15)
BUN: 10 mg/dL (ref 8–23)
CO2: 24 mmol/L (ref 22–32)
Calcium: 8 mg/dL — ABNORMAL LOW (ref 8.9–10.3)
Chloride: 107 mmol/L (ref 98–111)
Creatinine, Ser: 0.57 mg/dL — ABNORMAL LOW (ref 0.61–1.24)
GFR, Estimated: >60 mL/min (ref >60)
Glucose, Bld: 111 mg/dL — ABNORMAL HIGH (ref 70–99)
Potassium: 3.6 mmol/L (ref 3.5–5.1)
Sodium: 137 mmol/L (ref 135–145)
Total Bilirubin: 1.9 mg/dL — ABNORMAL HIGH (ref 0.3–1.2)
Total Protein: 4.9 g/dL — ABNORMAL LOW (ref 6.5–8.1)

## 2021-02-25 LAB — GLUCOSE, CAPILLARY
Glucose-Capillary: 117 mg/dL — ABNORMAL HIGH (ref 70–99)
Glucose-Capillary: 100 mg/dL — ABNORMAL HIGH (ref 70–99)
Glucose-Capillary: 170 mg/dL — ABNORMAL HIGH (ref 70–99)
Glucose-Capillary: 116 mg/dL — ABNORMAL HIGH (ref 70–99)
Glucose-Capillary: 165 mg/dL — ABNORMAL HIGH (ref 70–99)
Glucose-Capillary: 133 mg/dL — ABNORMAL HIGH (ref 70–99)

## 2021-02-25 LAB — PHOSPHORUS: Phosphorus: 2.4 mg/dL — ABNORMAL LOW (ref 2.5–4.6)

## 2021-02-25 LAB — MAGNESIUM: Magnesium: 1.9 mg/dL (ref 1.7–2.4)

## 2021-02-25 MED ORDER — IPRATROPIUM-ALBUTEROL 0.5-2.5 (3) MG/3ML IN SOLN: 3.0000 mL | Freq: Four times a day (QID) | RESPIRATORY_TRACT | Status: DC | PRN

## 2021-02-25 MED ORDER — SODIUM CHLORIDE 0.9 % IV SOLN
2.0000 g | INTRAVENOUS | Status: AC
  Administered 2021-02-25 – 2021-02-27 (×15): 2 g via INTRAVENOUS
  Filled 2021-02-25: qty 2000
  Filled 2021-02-25: qty 2
  Filled 2021-02-25 (×6): qty 2000
  Filled 2021-02-25 (×2): qty 2
  Filled 2021-02-25: qty 2000
  Filled 2021-02-25: qty 2
  Filled 2021-02-25 (×3): qty 2000

## 2021-02-25 MED ORDER — POTASSIUM PHOSPHATES 15 MMOLE/5ML IV SOLN
20.0000 mmol | Freq: Once | INTRAVENOUS | Status: AC
  Administered 2021-02-25: 20 mmol via INTRAVENOUS
  Filled 2021-02-25: qty 6.67

## 2021-02-25 NOTE — Progress Notes (Signed)
PROGRESS NOTE    Hector Lucero  NGE:[id number] DOB: [birth year] DOA: 02/22/2021 PCP: Venia Carbon, MD     Brief Narrative:  Hector Lucero is a 87 y.o. WM PMHx Paroxysmal Atrial Fibrillation, SVT, HTN, DM type II diet controlled.  HLD, OSA,  Presents to ED at [facility] with c/o abd pain.  Onset 2 days ago, progressively worsening, diffuse, associated nausea.  Has had vomiting and constipation.  Also chills and generalized weakness, mod difficulty walking due to weakness.   ED Course: Pt found to have Tm 104.2, have ascending cholangitis with 80mm CBD stone on MRCP.  Started on cefepime, flagyl, vanc empirically (before he was found to have E.Coli bacteremia on culture that has since resulted).  Dr. Hilarie Fredrickson consulted (see note).  While in ED pt also apparently went into SVT, couldn't convert with adenosine so got multiple rounds of Cardizem which converted him to NSR.  Lipase over 1k.  Got 2L IVF bolus.  At this time pt denies any pain (specifically denies abd pain), says "I never did have any".  Unfortunately patient seems to have developed delirium since being in the ED yesterday afternoon.  Pt not oriented to location nor time.  Thinks he is "in a school".  Pt seems to be pleasantly confused at the moment.  Is redirectable.  Subjective: 4/19 febrile overnight A/O x1 (does not know where, when, why) however much more interactive today.   Assessment & Plan: Covid vaccination;   Principal Problem:   Ascending cholangitis Active Problems:   Diabetes mellitus type 2, diet-controlled ([place])   Vascular dementia without behavioral disturbance ([place])   Choledocholithiasis with acute cholecystitis with obstruction   E coli bacteremia   PAF (paroxysmal atrial fibrillation) (HCC)   Acute gallstone pancreatitis   Abnormal radiologic findings on diagnostic imaging of renal pelvis, ureter, or bladder   Sepsis due to Escherichia coli (E. coli) (HCC)  Choledocholithiasis with  acute cholecystitis with obstruction -80mm CBD stone requiring ERCP - 4/16 s/p ERCP see results below - Continue current antibiotics for total of 7 days per ID recommendation - 4/17 GI signed off but recommended surgical consult for consideration of cholecystectomy - Will allow patient to complete at least 48 to 72 hours of antibiotics before consulting surgery given his bacteremia and acute metabolic mental status change. -4/20 we will consult surgery in the a.m. but doubt they will let to perform surgery, patient is a very poor surgical candidate  Sepsis - Upon admission patient does not meet sepsis criteria. -Multifactorial cholecystitis and bacteremia  Bacteremia positive E. Coli - Continue 2-week course antibiotics -Trend procalcitonin Results for [facility], MALENA (MRN [id number]) as of 02/24/2021 11:13  Ref. Range 02/22/2021 08:52 02/23/2021 00:40 02/24/2021 00:44  Procalcitonin Latest Units: ng/mL 21.76 15.38 8.93    Lactic acidosis -Resolved  Hypokalemia - Potassium goal> 4 - 4/19 K-Phos 20 mmol  Hypophosphatemia - Phosphorus goal> 5 - See hypokalemia  Delirium/ Agitation  -4/17 soft restraints - Haldol PRN -Seroquel 25 mg daily  Diabetes type 2 controlled without complication - 6/64 hemoglobin A1c= 7.0 - Sensitive SSI   HLD -4/18 LDL= 92  Dysphagia? - Patient coughing after consuming breakfast, possible aspiration. - 4/18 consult for swallow evaluation -4/18 DuoNeb QID - Incentive spirometer (patient probably will not be able to cooperate) -Flutter valve  (patient probably will not be able to cooperate)   DVT prophylaxis: SCD Code Status: DNR Family Communication: 4/18 spoke with [name] [facility]) HCPOA discussed plan of care answered  all questions Status is: Inpatient    Dispo: The patient is from: Home              Anticipated d/c is to: Home              Anticipated d/c date is: 4/20              Patient currently unstable      Consultants:  Dr.  Hilarie Fredrickson GI Dr. Carlyle Basques, ID   Procedures/Significant Events:  4/16 ERCP;-Choledocholithiasis was found. Complete removal  was accomplished by biliary sphincterotomy and  balloon extraction. -One plastic pancreatic stent was placed into the  ventral pancreatic duct. But no dye was injected into the pancreas - A precut biliary sphincterotomy was performed. - A needle-knife biliary sphincterotomy was performed. - A biliary sphincterotomy extension was performed. -The biliary tree was swept at the end of the procedure and nothing was found.    I have personally reviewed and interpreted all radiology studies and my findings are as above.  VENTILATOR SETTINGS: Nasal cannula 4/19 Flow 2 L/min SPO2 97%    Cultures 4/15 blood LEFT wrist positive E. coli 4/15 blood LEFT AC positive E. coli   Antimicrobials: Anti-infectives (From admission, onward)   Start     Ordered Stop   02/22/21 0400  cefTRIAXone (ROCEPHIN) 2 g in sodium chloride 0.9 % 100 mL IVPB        02/22/21 0354         Devices    LINES / TUBES:      Continuous Infusions: . ampicillin (OMNIPEN) IV    . lactated ringers 75 mL/hr at 02/24/21 1500     Objective: Vitals:   02/25/21 0822 02/25/21 0900 02/25/21 1149 02/25/21 1200  BP:  130/73 124/76 127/62  Pulse: 72 68 77   Resp: 16 17 16    Temp:   98.1 F (36.7 C)   TempSrc:   Oral   SpO2: 97%  92%   Weight:      Height:        Intake/Output Summary (Last 24 hours) at 02/25/2021 1208 Last data filed at 02/25/2021 1040 Gross per 24 hour  Intake 691.75 ml  Output 2550 ml  Net -1858.25 ml   Filed Weights   02/22/21 0216  Weight: 70.4 kg    Examination: Physical Exam:  General: A/O x1 (does not know where, when, why).  Does not follow commands Positive acute respiratory distress Eyes: negative scleral hemorrhage, negative anisocoria, negative icterus ENT: Negative Runny nose, negative gingival bleeding, Neck:  Negative scars, masses,  torticollis, lymphadenopathy, JVD Lungs: Clear to auscultation bilaterally without wheezes or crackles Cardiovascular: Regular rate and rhythm without murmur gallop or rub normal S1 and S2 Abdomen: negative abdominal pain, nondistended, positive soft, bowel sounds, no rebound, no ascites, no appreciable mass Extremities: No significant cyanosis, clubbing, or edema bilateral lower extremities Skin: Negative rashes, lesions, ulcers Psychiatric: Unable to appropriately evaluate secondary to delirium/agitation Central nervous system:  Cranial nerves II through XII intact, tongue/uvula midline, all extremities muscle strength 5/5, sensation intact throughout, negative dysarthria, negative expressive aphasia, negative receptive aphasia..     Data Reviewed: Care during the described time interval was provided by me .  I have reviewed this patient's available data, including medical history, events of note, physical examination, and all test results as part of my evaluation.  CBC: Recent Labs  Lab 02/21/21 1351 02/22/21 0451 02/23/21 0040 02/24/21 0044 02/25/21 0210  WBC 5.4 6.7 8.4 8.9 7.7  NEUTROABS 4.9  --  7.2 7.2 5.2  HGB 12.5* 11.6* 11.6* 10.8* 11.6*  HCT 38.1* 35.4* 35.9* 32.4* 36.5*  MCV 86.8 89.8 90.2 87.6 90.3  PLT 189 171 175 184 517   Basic Metabolic Panel: Recent Labs  Lab 02/21/21 1351 02/22/21 0451 02/22/21 0852 02/23/21 0040 02/24/21 0044 02/25/21 0210  NA 135 136  --  134* 136 137  K 2.9* 3.1*  --  3.6  3.6 3.6 3.6  CL 100 105  --  104 109 107  CO2 23 25  --  23 23 24   GLUCOSE 169* 126*  --  146* 128* 111*  BUN 20 11  --  11 13 10   CREATININE 0.52* 0.75  --  0.77 0.66 0.57*  CALCIUM 8.9 8.1*  --  8.1* 7.9* 8.0*  MG  --   --  2.0 2.0  2.0 2.1 1.9  PHOS  --   --  2.6 2.0* 1.2* 2.4*   GFR: Estimated Creatinine Clearance: 58.2 mL/min (A) (by C-G formula based on SCr of 0.57 mg/dL (L)). Liver Function Tests: Recent Labs  Lab 02/21/21 1351 02/22/21 0451  02/23/21 0040 02/24/21 0044 02/25/21 0210  AST 142* 78* 47* 28 33  ALT 156* 105* 81* 58* 53*  ALKPHOS 176* 128* 127* 108 106  BILITOT 8.6* 7.7* 5.5* 2.7* 1.9*  PROT 7.0 5.2* 5.3* 5.0* 4.9*  ALBUMIN 3.9 2.7* 2.5* 2.4* 2.3*   Recent Labs  Lab 02/21/21 1351  LIPASE 1,247*   No results for input(s): AMMONIA in the last 168 hours. Coagulation Profile: Recent Labs  Lab 02/21/21 1410  INR 1.2   Cardiac Enzymes: No results for input(s): CKTOTAL, CKMB, CKMBINDEX, TROPONINI in the last 168 hours. BNP (last 3 results) No results for input(s): PROBNP in the last 8760 hours. HbA1C: Recent Labs    02/24/21 0044  HGBA1C 6.7*   CBG: Recent Labs  Lab 02/24/21 2001 02/24/21 2353 02/25/21 0413 02/25/21 0737 02/25/21 1151  GLUCAP 132* 105* 117* 100* 170*   Lipid Profile: Recent Labs    02/24/21 0044  CHOL 154  HDL 44  LDLCALC 92  TRIG 91  CHOLHDL 3.5   Thyroid Function Tests: Recent Labs    02/23/21 0040  TSH 0.359   Anemia Panel: No results for input(s): VITAMINB12, FOLATE, FERRITIN, TIBC, IRON, RETICCTPCT in the last 72 hours. Sepsis Labs: Recent Labs  Lab 02/21/21 1351 02/21/21 1732 02/22/21 0852 02/22/21 1428 02/23/21 0040 02/24/21 0044  PROCALCITON  --   --  21.76  --  15.38 8.93  LATICACIDVEN 2.2* 1.5 1.3 1.2  --   --     Recent Results (from the past 240 hour(s))  Blood Culture (routine x 2)     Status: Abnormal   Collection Time: 02/21/21  1:51 PM   Specimen: BLOOD  Result Value Ref Range Status   Specimen Description   Final    BLOOD BLOOD LEFT WRIST Performed at [facility], [address], [place], [place] [address]    Special Requests   Final    BOTTLES DRAWN AEROBIC AND ANAEROBIC Blood Culture adequate volume Performed at [facility], [address], [place], [place] [address]    Culture  Setup Time   Final    GRAM NEGATIVE RODS IN BOTH AEROBIC AND ANAEROBIC BOTTLES CRITICAL VALUE NOTED.  VALUE IS CONSISTENT WITH  PREVIOUSLY REPORTED AND CALLED VALUE. Performed at [facility], [address], [place], [place] [address]    Culture (A)  Final    ESCHERICHIA COLI SUSCEPTIBILITIES PERFORMED ON PREVIOUS CULTURE WITHIN THE LAST 5 DAYS. Performed at [facility], [place] [address], [place], [place] [address]    Report Status 02/24/2021 FINAL  Final  Resp Panel by RT-PCR (Flu A&B, Covid) Nasopharyngeal Swab     Status: None   Collection Time: 02/21/21  2:10 PM   Specimen: Nasopharyngeal Swab; Nasopharyngeal(NP) swabs in vial transport medium  Result Value Ref Range Status   SARS Coronavirus 2 by RT PCR NEGATIVE NEGATIVE Final    Comment: (NOTE) SARS-CoV-2 target nucleic acids are NOT DETECTED.  The SARS-CoV-2 RNA is generally detectable in upper respiratory specimens during the acute phase of infection. The lowest concentration of SARS-CoV-2 viral copies this assay can detect is 138 copies/mL. A negative result does not preclude SARS-Cov-2 infection and should not be used as the sole basis for treatment or other patient management decisions. A negative result may occur with  improper specimen collection/handling, submission of specimen other than nasopharyngeal swab, presence of viral mutation(s) within the areas targeted by this assay, and inadequate number of viral copies(<138 copies/mL). A negative result must be combined with clinical observations, patient history, and epidemiological information. The expected result is Negative.  Fact Sheet for Patients:  EntrepreneurPulse.com.au  Fact Sheet for Healthcare Providers:  IncredibleEmployment.be  This test is no t yet approved or cleared by the [place] FDA and  has been authorized for detection and/or diagnosis of SARS-CoV-2 by FDA under an Emergency Use Authorization (EUA). This EUA will remain  in effect (meaning this test can be used) for the duration of the COVID-19 declaration under  Section 564(b)(1) of the Act, 21 U.S.C.section 360bbb-3(b)(1), unless the authorization is terminated  or revoked sooner.       Influenza A by PCR NEGATIVE NEGATIVE Final   Influenza B by PCR NEGATIVE NEGATIVE Final    Comment: (NOTE) The Xpert Xpress SARS-CoV-2/FLU/RSV plus assay is intended as an aid in the diagnosis of influenza from Nasopharyngeal swab specimens and should not be used as a sole basis for treatment. Nasal washings and aspirates are unacceptable for Xpert Xpress SARS-CoV-2/FLU/RSV testing.  Fact Sheet for Patients: EntrepreneurPulse.com.au  Fact Sheet for Healthcare Providers: IncredibleEmployment.be  This test is not yet approved or cleared by the [place] FDA and has been authorized for detection and/or diagnosis of SARS-CoV-2 by FDA under an Emergency Use Authorization (EUA). This EUA will remain in effect (meaning this test can be used) for the duration of the COVID-19 declaration under Section 564(b)(1) of the Act, 21 U.S.C. section 360bbb-3(b)(1), unless the authorization is terminated or revoked.  Performed at [facility], [address] [place], [place] [address]   Blood Culture (routine x 2)     Status: Abnormal   Collection Time: 02/21/21  2:10 PM   Specimen: BLOOD  Result Value Ref Range Status   Specimen Description   Final    BLOOD LEFT ANTECUBITAL Performed at [facility], [address] [place], [place] [address]    Special Requests   Final    BOTTLES DRAWN AEROBIC AND ANAEROBIC Blood Culture adequate volume Performed at [facility], [place],  [address]    Culture  Setup Time   Final    GRAM NEGATIVE RODS IN BOTH AEROBIC AND ANAEROBIC BOTTLES CRITICAL RESULT CALLED TO, READ BACK BY AND VERIFIED WITH: Winfield Rast PATEL ON 02/22/21 AT 0017 MF Performed at [facility], [place],  [place] [address]    Culture ESCHERICHIA COLI  (A)  Final   Report Status 02/24/2021 FINAL  Final   Organism ID, Bacteria ESCHERICHIA COLI  Final      Susceptibility   Escherichia coli - MIC*    AMPICILLIN <=2 SENSITIVE Sensitive     CEFAZOLIN <=4 SENSITIVE Sensitive     CEFEPIME <=0.12 SENSITIVE Sensitive     CEFTAZIDIME <=1 SENSITIVE Sensitive     CEFTRIAXONE <=0.25 SENSITIVE Sensitive     CIPROFLOXACIN <=0.25 SENSITIVE Sensitive     GENTAMICIN <=1 SENSITIVE Sensitive     IMIPENEM <=0.25 SENSITIVE Sensitive     TRIMETH/SULFA <=20 SENSITIVE Sensitive     AMPICILLIN/SULBACTAM <=2 SENSITIVE Sensitive     PIP/TAZO <=4 SENSITIVE Sensitive     * ESCHERICHIA COLI  Urine culture     Status: None   Collection Time: 02/21/21  2:10 PM   Specimen: In/Out Cath Urine  Result Value Ref Range Status   Specimen Description   Final    IN/OUT CATH URINE Performed at [facility], [address] [place], [place] [address]    Special Requests   Final    NONE Performed at [facility], [address], [place], [place] [address]    Culture   Final    NO GROWTH Performed at [facility], [place] [address], [place], [place] [address]    Report Status 02/23/2021 FINAL  Final  Blood Culture ID Panel (Reflexed)     Status: Abnormal   Collection Time: 02/21/21  2:10 PM  Result Value Ref Range Status   Enterococcus faecalis NOT DETECTED NOT DETECTED Final   Enterococcus Faecium NOT DETECTED NOT DETECTED Final   Listeria monocytogenes NOT DETECTED NOT DETECTED Final   Staphylococcus species NOT DETECTED NOT DETECTED Final   Staphylococcus aureus (BCID) NOT DETECTED NOT DETECTED Final   Staphylococcus epidermidis NOT DETECTED NOT DETECTED Final   Staphylococcus lugdunensis NOT DETECTED NOT DETECTED Final   Streptococcus species NOT DETECTED NOT DETECTED Final   Streptococcus agalactiae NOT DETECTED NOT DETECTED Final   Streptococcus pneumoniae NOT DETECTED NOT DETECTED Final   Streptococcus pyogenes NOT DETECTED NOT  DETECTED Final   A.calcoaceticus-baumannii NOT DETECTED NOT DETECTED Final   Bacteroides fragilis NOT DETECTED NOT DETECTED Final   Enterobacterales DETECTED (A) NOT DETECTED Final    Comment: Enterobacterales represent a large order of gram negative bacteria, not a single organism. CRITICAL RESULT CALLED TO, READ BACK BY AND VERIFIED WITH: KISHAN LEPLAT AT 5/02/22/22 MF    Enterobacter cloacae complex NOT DETECTED NOT DETECTED Final   Escherichia coli DETECTED (A) NOT DETECTED Final    Comment: CRITICAL RESULT CALLED TO, READ BACK BY AND VERIFIED WITH: KISHAN LEPLAT AT 0016 02/22/21 MF    Klebsiella aerogenes NOT DETECTED NOT DETECTED Final   Klebsiella oxytoca NOT DETECTED NOT DETECTED Final   Klebsiella pneumoniae NOT DETECTED NOT DETECTED Final   Proteus species NOT DETECTED NOT DETECTED Final   Salmonella species NOT DETECTED NOT DETECTED Final   Serratia marcescens NOT DETECTED NOT DETECTED Final   Haemophilus influenzae NOT DETECTED NOT DETECTED Final   Neisseria meningitidis NOT DETECTED NOT DETECTED Final   Pseudomonas aeruginosa NOT DETECTED NOT DETECTED Final   Stenotrophomonas maltophilia NOT DETECTED NOT DETECTED Final   Candida albicans NOT DETECTED NOT DETECTED Final   Candida auris NOT DETECTED NOT DETECTED Final   Candida glabrata NOT DETECTED NOT DETECTED Final   Candida krusei NOT DETECTED NOT DETECTED Final  Candida parapsilosis NOT DETECTED NOT DETECTED Final   Candida tropicalis NOT DETECTED NOT DETECTED Final   Cryptococcus neoformans/gattii NOT DETECTED NOT DETECTED Final   CTX-M ESBL NOT DETECTED NOT DETECTED Final   Carbapenem resistance IMP NOT DETECTED NOT DETECTED Final   Carbapenem resistance KPC NOT DETECTED NOT DETECTED Final   Carbapenem resistance NDM NOT DETECTED NOT DETECTED Final   Carbapenem resist OXA 48 LIKE NOT DETECTED NOT DETECTED Final   Carbapenem resistance VIM NOT DETECTED NOT DETECTED Final    Comment: Performed at [facility], [address] [place], [place] [address]  MRSA PCR Screening     Status: None   Collection Time: 02/22/21  4:24 AM   Specimen: Nasopharyngeal  Result Value Ref Range Status   MRSA by PCR NEGATIVE NEGATIVE Final    Comment:        The GeneXpert MRSA Assay (FDA approved for NASAL specimens only), is one component of a comprehensive MRSA colonization surveillance program. It is not intended to diagnose MRSA infection nor to guide or monitor treatment for MRSA infections. Performed at [facility], [place] [address], [place], [place] [address]          Radiology Studies: DG Abd 1 View  Result Date: 02/24/2021 CLINICAL DATA:  Pancreatic stent migration EXAM: ABDOMEN - 1 VIEW COMPARISON:  ERCP 02/22/2021 FINDINGS: Bowel gas pattern is within normal limits. I do not identify a pancreatic or biliary stent. No stent is visible within the intestine in any other location. Chronic curvature in degenerative change affects the spine. Aortic atherosclerotic calcification is present. IMPRESSION: No stent is identified either in situ or migrated. Electronically Signed   By: Nelson Chimes M.D.   On: 02/24/2021 09:09        Scheduled Meds: . diltiazem  30 mg Oral Q6H  . insulin aspart  0-9 Units Subcutaneous Q4H  . ipratropium-albuterol  3 mL Nebulization Q6H  . QUEtiapine  25 mg Oral Daily   Continuous Infusions: . ampicillin (OMNIPEN) IV    . lactated ringers 75 mL/hr at 02/24/21 1500     LOS: 3 days    Time spent:5 min    Joey Hudock, Geraldo Docker, MD [facility]   If 7PM-7AM, please contact night-coverage 02/25/2021, 12:08 PM

## 2021-02-25 NOTE — Progress Notes (Signed)
[facility] for Infectious Disease    Date of Admission:  02/22/2021   Total days of antibiotics 5/day 1 amp          ID: Hector Lucero is a 87 y.o. male with  Principal Problem:   Ascending cholangitis Active Problems:   Diabetes mellitus type 2, diet-controlled ([place])   Vascular dementia without behavioral disturbance ([place])   Choledocholithiasis with acute cholecystitis with obstruction   E coli bacteremia   PAF (paroxysmal atrial fibrillation) (HCC)   Acute gallstone pancreatitis   Abnormal radiologic findings on diagnostic imaging of renal pelvis, ureter, or bladder   Sepsis due to Escherichia coli (E. coli) (HCC)    Subjective: Afebrile, alert this morning, reports that he slept well. Has some improvement with abdominal pain  Medications:  . diltiazem  30 mg Oral Q6H  . insulin aspart  0-9 Units Subcutaneous Q4H  . ipratropium-albuterol  3 mL Nebulization Q6H  . QUEtiapine  25 mg Oral Daily    Objective: Vital signs in last 24 hours: Temp:  [98 F (36.7 C)-98.7 F (37.1 C)] 98.1 F (36.7 C) (04/19 1149) Pulse Rate:  [68-94] 77 (04/19 1149) Resp:  [14-21] 16 (04/19 1149) BP: (110-135)/(62-77) 127/62 (04/19 1200) SpO2:  [92 %-98 %] 92 % (04/19 1149) Physical Exam  Constitutional: He is oriented to person, only. He appears well-developed and well-nourished. No distress.  HENT:  Mouth/Throat: Oropharynx is clear and moist. No oropharyngeal exudate.  Cardiovascular: Normal rate, regular rhythm and normal heart sounds. Exam reveals no gallop and no friction rub.  No murmur heard.  Pulmonary/Chest: Effort normal and breath sounds normal. No respiratory distress. He has no wheezes.  Abdominal: Soft. Bowel sounds are normal. He exhibits no distension. There is no tenderness.  Lymphadenopathy:  He has no cervical adenopathy.  Neurological: He is alert and oriented to person. Skin: Skin is warm and dry. No rash noted. No erythema.  Psychiatric: He has a normal  mood and affect. His behavior is normal.     Lab Results Recent Labs    02/24/21 0044 02/25/21 0210  WBC 8.9 7.7  HGB 10.8* 11.6*  HCT 32.4* 36.5*  NA 136 137  K 3.6 3.6  CL 109 107  CO2 23 24  BUN 13 10  CREATININE 0.66 0.57*   Liver Panel Recent Labs    02/24/21 0044 02/25/21 0210  PROT 5.0* 4.9*  ALBUMIN 2.4* 2.3*  AST 28 33  ALT 58* 53*  ALKPHOS 108 106  BILITOT 2.7* 1.9*    Microbiology: ecoli bacteremia (pan sensitive) Studies/Results: DG Abd 1 View  Result Date: 02/24/2021 CLINICAL DATA:  Pancreatic stent migration EXAM: ABDOMEN - 1 VIEW COMPARISON:  ERCP 02/22/2021 FINDINGS: Bowel gas pattern is within normal limits. I do not identify a pancreatic or biliary stent. No stent is visible within the intestine in any other location. Chronic curvature in degenerative change affects the spine. Aortic atherosclerotic calcification is present. IMPRESSION: No stent is identified either in situ or migrated. Electronically Signed   By: Nelson Chimes M.D.   On: 02/24/2021 09:09     Assessment/Plan: Ascending cholangitis with secondary e.coli bacteremia s/p ecrp, biliary sphincterotomy and stone extraction = recommend to continue on ampicillin while hospitalized to finish out course a total 7 day course of abtx through 4/21. If he is ready for discharge prior to 4/21, can transition him to amox 1gm PO TID to finish out treatment  Encephalopathy = somewhat improved today but difficult to  tell if this is his baseline. Defer to primary team for management  Will sign off.  [facility] for Infectious Diseases Cell: [phone] Pager: [phone]  02/25/2021, 12:16 PM

## 2021-02-25 NOTE — Plan of Care (Signed)

## 2021-02-25 NOTE — Care Management Important Message (Signed)
Important Message  Patient Details  Name: Hector Lucero MRN: [id number] Date of Birth: 1928/04/30   Medicare Important Message Given:  Yes     Hector Lucero 02/25/2021, 3:49 PM

## 2021-02-25 NOTE — Evaluation (Signed)
Clinical/Bedside Swallow Evaluation Patient Details  Name: Hector Lucero MRN: [id number] Date of Birth: November 15, 1927  Today's Date: 02/25/2021 Time: SLP Start Time (ACUTE ONLY): 1058 SLP Stop Time (ACUTE ONLY): 1130 SLP Time Calculation (min) (ACUTE ONLY): 32 min  Past Medical History:  Past Medical History:  Diagnosis Date  . Atrial fibrillation ([place])   . Diabetes mellitus without complication ([place])   . Fatigue   . HLD (hyperlipidemia)   . Hypertension   . OSA (obstructive sleep apnea)   . Radial fracture 2006   right, screws and plates  . SVT (supraventricular tachycardia) ([place]) 2012   a. Holter in 2012 showed 27 episodes of SVT/atrial tach w/ longest being 36 beats; b. asymptomatic  . Syncope 08/2015   a. short rhythm strip in [facility] ED showed junctional escape rhythm and AV dissociation with essentially sinus bradycardia  . Ulnar fracture 1995   fall off of ladder   Past Surgical History:  Past Surgical History:  Procedure Laterality Date  . ERCP Left 02/22/2021   Procedure: ENDOSCOPIC RETROGRADE CHOLANGIOPANCREATOGRAPHY (ERCP);  Surgeon: Clarene Essex, MD;  Location: [place];  Service: Endoscopy;  Laterality: Left;  . HERNIA REPAIR  Aug 2012   right indirect inguinal, with mesh  . PANCREATIC STENT PLACEMENT  02/22/2021   Procedure: PANCREATIC STENT PLACEMENT;  Surgeon: Clarene Essex, MD;  Location: [place];  Service: Endoscopy;;  . REMOVAL OF STONES  02/22/2021   Procedure: REMOVAL OF STONES;  Surgeon: Clarene Essex, MD;  Location: [place];  Service: Endoscopy;;  . Joan Mayans  02/22/2021   Procedure: SPHINCTEROTOMY;  Surgeon: Clarene Essex, MD;  Location: [facility] ENDOSCOPY;  Service: Endoscopy;;   HPI:  87 y.o. male with hx of diet contrlled DM, HTN, SVT, a fib who was admitted to [facility] on 4/16 with diffuse abdominal pain with vomiting, chills, weakness x 2 days prior to admit. abd GI imaging- GB wall thickening, with associated CBD and intrahepatic biliary ductal  dilatation. No PD dilatation nor pancreatitis. His infectious work up revealed pan-sensitive e.coli bacteremia. He had successful ERCP with biliary stone extraction on 4/16 (ETT) for cholangitis. Temporary PD stent placed. GI is still recommending surgical evaluation. Per MD note (02/24/21)- "Patient coughing after consuming breakfast, possible aspiration". Chest CT (02/21/21)-Increased density right paratracheal region most consistent with  vascular shadow. PA and lateral views of the chest may be useful for follow-up. No acute airspace disease.  BSE requested.   Assessment / Plan / Recommendation Clinical Impression  Pt seen for bedside evaluation with RN reporting that pt is more alert than he has been and presents with baseline dementia. Despite cognitive deficits, pt able to follow simple commands to complete oral mechanism examination, which was unremarkable. Cough x1 noted prior to PO intake, which was mildy wet/congested, but strong. Thin liquids via consecutive, large straw sips resulted in intermittent throat clearing, which improved with assist for small controlled sips at slow rate. Immediate and more consistent throat clearing observed with mixed consistency solid with effortful mastication with regular textures. Called Nephew who reports that he has not known pt to have trouble swallowing prior to hospitalization. With full supervision from staff for small bites/sips, slow rate and upright positioning, recommend dysphagia 3, thin liquid diet with SLP to f/u for tolerance. Discussed recommendations and swallow precautions with RN.  SLP Visit Diagnosis: Dysphagia, unspecified (R13.10)    Aspiration Risk  Mild aspiration risk    Diet Recommendation Dysphagia 3 (Mech soft);Thin liquid   Liquid Administration via: Cup;Straw Medication Administration:  Whole meds with puree Supervision: Full supervision/cueing for compensatory strategies;Patient able to self feed Compensations: Small  sips/bites;Slow rate;Minimize environmental distractions Postural Changes: Seated upright at 90 degrees    Other  Recommendations Oral Care Recommendations: Oral care BID   Follow up Recommendations  (TBD)      Frequency and Duration min 2x/week  1 week       Prognosis Prognosis for Safe Diet Advancement: Good Barriers to Reach Goals: Cognitive deficits      Swallow Study   General Date of Onset: 02/24/21 HPI: 87 y.o. male with hx of diet contrlled DM, HTN, SVT, a fib who was admitted to [facility] on 4/16 with diffuse abdominal pain with vomiting, chills, weakness x 2 days prior to admit. abd GI imaging- GB wall thickening, with associated CBD and intrahepatic biliary ductal dilatation. No PD dilatation nor pancreatitis. His infectious work up revealed pan-sensitive e.coli bacteremia. He had successful ERCP with biliary stone extraction on 4/16 (ETT) for cholangitis. Temporary PD stent placed. GI is still recommending surgical evaluation. Per MD note (02/24/21)- "Patient coughing after consuming breakfast, possible aspiration". Chest CT (02/21/21)-Increased density right paratracheal region most consistent with  vascular shadow. PA and lateral views of the chest may be useful for follow-up. No acute airspace disease.  BSE requested. Type of Study: Bedside Swallow Evaluation Previous Swallow Assessment: none Diet Prior to this Study: NPO (excpet for meds, per RN) Temperature Spikes Noted: No Respiratory Status: Room air (nasal cannula removed prior to SLP eval by RN) History of Recent Intubation: Yes Length of Intubations (days): 1 days Date extubated: 02/22/21 Behavior/Cognition: Alert;Cooperative;Pleasant mood;Confused Oral Cavity Assessment: Within Functional Limits Oral Care Completed by SLP: No Oral Cavity - Dentition:  (dentures) Vision: Functional for self-feeding Self-Feeding Abilities: Able to feed self;Needs assist Patient Positioning: Upright in bed;Postural control adequate for  testing Baseline Vocal Quality: Normal;Low vocal intensity Volitional Cough: Strong Volitional Swallow: Able to elicit    Oral/Motor/Sensory Function Overall Oral Motor/Sensory Function: Within functional limits   Ice Chips Ice chips: Not tested   Thin Liquid Thin Liquid: Impaired Presentation: Spoon;Cup Pharyngeal  Phase Impairments: Throat Clearing - Immediate    Nectar Thick Nectar Thick Liquid: Not tested   Honey Thick Honey Thick Liquid: Not tested   Puree Puree: Within functional limits   Solid     Solid: Impaired Presentation: Spoon Oral Phase Impairments:  (effortful mastication) Pharyngeal Phase Impairments: Throat Clearing - Immediate Other Comments:  (intermittent thorat clearing with mixed consistency solid)     Ellwood Dense, MA, [place] Office Number: [phone]  Acie Fredrickson 02/25/2021,12:00 PM

## 2021-02-26 LAB — GLUCOSE, CAPILLARY
Glucose-Capillary: 105 mg/dL — ABNORMAL HIGH (ref 70–99)
Glucose-Capillary: 114 mg/dL — ABNORMAL HIGH (ref 70–99)
Glucose-Capillary: 178 mg/dL — ABNORMAL HIGH (ref 70–99)
Glucose-Capillary: 167 mg/dL — ABNORMAL HIGH (ref 70–99)
Glucose-Capillary: 145 mg/dL — ABNORMAL HIGH (ref 70–99)

## 2021-02-26 LAB — CBC WITH DIFFERENTIAL/PLATELET
Abs Immature Granulocytes: 0.24 10*3/uL — ABNORMAL HIGH (ref 0.00–0.07)
Basophils Absolute: 0 10*3/uL (ref 0.0–0.1)
Basophils Relative: 1 %
Eosinophils Absolute: 0.2 10*3/uL (ref 0.0–0.5)
Eosinophils Relative: 4 %
HCT: 35.1 % — ABNORMAL LOW (ref 39.0–52.0)
Hemoglobin: 11.5 g/dL — ABNORMAL LOW (ref 13.0–17.0)
Immature Granulocytes: 4 %
Lymphocytes Relative: 22 %
Lymphs Abs: 1.4 10*3/uL (ref 0.7–4.0)
MCH: 29 pg (ref 26.0–34.0)
MCHC: 32.8 g/dL (ref 30.0–36.0)
MCV: 88.6 fL (ref 80.0–100.0)
Monocytes Absolute: 0.8 10*3/uL (ref 0.1–1.0)
Monocytes Relative: 12 %
Neutro Abs: 3.7 10*3/uL (ref 1.7–7.7)
Neutrophils Relative %: 57 %
Platelets: 208 10*3/uL (ref 150–400)
RBC: 3.96 MIL/uL — ABNORMAL LOW (ref 4.22–5.81)
RDW: 14.5 % (ref 11.5–15.5)
WBC: 6.4 10*3/uL (ref 4.0–10.5)
nRBC: 0 % (ref 0.0–0.2)

## 2021-02-26 LAB — COMPREHENSIVE METABOLIC PANEL
ALT: 54 U/L — ABNORMAL HIGH (ref 0–44)
AST: 43 U/L — ABNORMAL HIGH (ref 15–41)
Albumin: 2.1 g/dL — ABNORMAL LOW (ref 3.5–5.0)
Alkaline Phosphatase: 105 U/L (ref 38–126)
Anion gap: 6 (ref 5–15)
BUN: 8 mg/dL (ref 8–23)
CO2: 24 mmol/L (ref 22–32)
Calcium: 7.7 mg/dL — ABNORMAL LOW (ref 8.9–10.3)
Chloride: 106 mmol/L (ref 98–111)
Creatinine, Ser: 0.59 mg/dL — ABNORMAL LOW (ref 0.61–1.24)
GFR, Estimated: >60 mL/min (ref >60)
Glucose, Bld: 129 mg/dL — ABNORMAL HIGH (ref 70–99)
Potassium: 3.4 mmol/L — ABNORMAL LOW (ref 3.5–5.1)
Sodium: 136 mmol/L (ref 135–145)
Total Bilirubin: 2.1 mg/dL — ABNORMAL HIGH (ref 0.3–1.2)
Total Protein: 4.6 g/dL — ABNORMAL LOW (ref 6.5–8.1)

## 2021-02-26 LAB — MAGNESIUM: Magnesium: 1.9 mg/dL (ref 1.7–2.4)

## 2021-02-26 LAB — PHOSPHORUS: Phosphorus: 3.4 mg/dL (ref 2.5–4.6)

## 2021-02-26 MED ORDER — POTASSIUM CHLORIDE CRYS ER 20 MEQ PO TBCR
40.0000 meq | EXTENDED_RELEASE_TABLET | Freq: Once | ORAL | Status: AC
  Administered 2021-02-26: 40 meq via ORAL
  Filled 2021-02-26: qty 2

## 2021-02-26 NOTE — Progress Notes (Signed)
PROGRESS NOTE    Hector Lucero  WSF:[id number] DOB: 1927-11-25 DOA: 02/22/2021 PCP: Venia Carbon, MD     Brief Narrative:  Hector Lucero is a 87 y.o. WM PMHx Paroxysmal Atrial Fibrillation, SVT, HTN, DM type II diet controlled.  HLD, OSA, Presented to ED at [facility] with c/o abd pain.  Found to have cholangitis and is s/p stent   Subjective: Says he plays softball outpatient in his neighborhood and is very active at home   Assessment & Plan:   Principal Problem:   Ascending cholangitis Active Problems:   Diabetes mellitus type 2, diet-controlled ([place])   Vascular dementia without behavioral disturbance ([place])   Choledocholithiasis with acute cholecystitis with obstruction   E coli bacteremia   PAF (paroxysmal atrial fibrillation) ([facility])   Acute gallstone pancreatitis   Abnormal radiologic findings on diagnostic imaging of renal pelvis, ureter, or bladder   Sepsis due to Escherichia coli (E. coli) ([place])   Choledocholithiasis with acute cholecystitis with obstruction/cholangitis -60mm CBD stone requiring ERCP - 4/16 s/p ERCP see results below - Continue current antibiotics for total of 7 days per ID recommendation - 4/17 GI signed off but recommended surgical consult for consideration of cholecystectomy - general surgery consult -no h/o CAD-- does have a fib-- score is zero on Revised Cardiac Risk Index for Pre-Operative Risk -echo/EKG  Reported Sepsis - Upon admission patient does not meet sepsis criteria.  Bacteremia positive E. Coli - Continue  course antibiotics  Lactic acidosis -Resolved  Hypokalemia - Potassium goal> 4 - 4/19 K-Phos 20 mmol  Hypophosphatemia - Phosphorus goal> 5 - See hypokalemia  Delirium/ Agitation  - Haldol PRN -Seroquel 25 mg daily  Diabetes type 2 controlled without complication - 0/17 hemoglobin A1c= 7.0 - Sensitive SSI   HLD -4/18 LDL= 92  Dysphagia? - DYS diet ordered by SLP - Incentive spirometer (patient probably  will not be able to cooperate) -Flutter valve  (patient probably will not be able to cooperate)   DVT prophylaxis: SCD Code Status: DNR Family Communication: called family Status is: Inpatient    Dispo: The patient is from: Home              Anticipated d/c is to: Home                   Consultants:  Dr. Hilarie Fredrickson GI Dr. Carlyle Basques, ID General Surgery   Procedures/Significant Events:  4/16 ERCP   Cultures 4/15 blood LEFT wrist positive E. coli 4/15 blood LEFT AC positive E. coli    Continuous Infusions: . ampicillin (OMNIPEN) IV 2 g (02/26/21 1145)  . lactated ringers 75 mL/hr at 02/26/21 0455     Objective: Vitals:   02/26/21 0400 02/26/21 0511 02/26/21 0730 02/26/21 1117  BP: 131/72 129/87 117/69 133/76  Pulse: 71  69 77  Resp: 18  19 20   Temp: 98 F (36.7 C)  97.7 F (36.5 C) 98.4 F (36.9 C)  TempSrc: Oral  Oral Oral  SpO2: 93%  92% 93%  Weight:      Height:        Intake/Output Summary (Last 24 hours) at 02/26/2021 1504 Last data filed at 02/26/2021 1453 Gross per 24 hour  Intake 1945 ml  Output 4200 ml  Net -2255 ml   Filed Weights   02/22/21 0216  Weight: 70.4 kg    Examination: Physical Exam:   General: Appearance:    elderly male in no acute distress     Lungs:  respirations unlabored  Heart:    Normal heart rate.   MS:   All extremities are intact.   Neurologic:   Awake, alert, pleasant and cooperative     CBC: Recent Labs  Lab 02/21/21 1351 02/22/21 0451 02/23/21 0040 02/24/21 0044 02/25/21 0210 02/26/21 0110  WBC 5.4 6.7 8.4 8.9 7.7 6.4  NEUTROABS 4.9  --  7.2 7.2 5.2 3.7  HGB 12.5* 11.6* 11.6* 10.8* 11.6* 11.5*  HCT 38.1* 35.4* 35.9* 32.4* 36.5* 35.1*  MCV 86.8 89.8 90.2 87.6 90.3 88.6  PLT 189 171 175 184 201 654   Basic Metabolic Panel: Recent Labs  Lab 02/22/21 0451 02/22/21 0852 02/23/21 0040 02/24/21 0044 02/25/21 0210 02/26/21 0110  NA 136  --  134* 136 137 136  K 3.1*  --  3.6  3.6 3.6  3.6 3.4*  CL 105  --  104 109 107 106  CO2 25  --  23 23 24 24   GLUCOSE 126*  --  146* 128* 111* 129*  BUN 11  --  11 13 10 8   CREATININE 0.75  --  0.77 0.66 0.57* 0.59*  CALCIUM 8.1*  --  8.1* 7.9* 8.0* 7.7*  MG  --  2.0 2.0  2.0 2.1 1.9 1.9  PHOS  --  2.6 2.0* 1.2* 2.4* 3.4   GFR: Estimated Creatinine Clearance: 58.2 mL/min (A) (by C-G formula based on SCr of 0.59 mg/dL (L)). Liver Function Tests: Recent Labs  Lab 02/22/21 0451 02/23/21 0040 02/24/21 0044 02/25/21 0210 02/26/21 0110  AST 78* 47* 28 33 43*  ALT 105* 81* 58* 53* 54*  ALKPHOS 128* 127* 108 106 105  BILITOT 7.7* 5.5* 2.7* 1.9* 2.1*  PROT 5.2* 5.3* 5.0* 4.9* 4.6*  ALBUMIN 2.7* 2.5* 2.4* 2.3* 2.1*   Recent Labs  Lab 02/21/21 1351  LIPASE 1,247*   No results for input(s): AMMONIA in the last 168 hours. Coagulation Profile: Recent Labs  Lab 02/21/21 1410  INR 1.2   Cardiac Enzymes: No results for input(s): CKTOTAL, CKMB, CKMBINDEX, TROPONINI in the last 168 hours. BNP (last 3 results) No results for input(s): PROBNP in the last 8760 hours. HbA1C: Recent Labs    02/24/21 0044  HGBA1C 6.7*   CBG: Recent Labs  Lab 02/25/21 2005 02/25/21 2327 02/26/21 0425 02/26/21 0835 02/26/21 1117  GLUCAP 165* 133* 105* 114* 178*   Lipid Profile: Recent Labs    02/24/21 0044  CHOL 154  HDL 44  LDLCALC 92  TRIG 91  CHOLHDL 3.5   Thyroid Function Tests: No results for input(s): TSH, T4TOTAL, FREET4, T3FREE, THYROIDAB in the last 72 hours. Anemia Panel: No results for input(s): VITAMINB12, FOLATE, FERRITIN, TIBC, IRON, RETICCTPCT in the last 72 hours. Sepsis Labs: Recent Labs  Lab 02/21/21 1351 02/21/21 1732 02/22/21 0852 02/22/21 1428 02/23/21 0040 02/24/21 0044  PROCALCITON  --   --  21.76  --  15.38 8.93  LATICACIDVEN 2.2* 1.5 1.3 1.2  --   --     Recent Results (from the past 240 hour(s))  Blood Culture (routine x 2)     Status: Abnormal   Collection Time: 02/21/21  1:51 PM    Specimen: BLOOD  Result Value Ref Range Status   Specimen Description   Final    BLOOD BLOOD LEFT WRIST Performed at [facility], [address], [place], [place] [address]    Special Requests   Final    BOTTLES DRAWN AEROBIC AND ANAEROBIC Blood Culture adequate volume Performed at [facility], [address], [place], [place] [address]    Culture  Setup Time   Final    GRAM NEGATIVE RODS IN BOTH AEROBIC AND ANAEROBIC BOTTLES CRITICAL VALUE NOTED.  VALUE IS CONSISTENT WITH PREVIOUSLY REPORTED AND CALLED VALUE. Performed at [facility], [address] [place], [place] [address]    Culture (A)  Final    ESCHERICHIA COLI SUSCEPTIBILITIES PERFORMED ON PREVIOUS CULTURE WITHIN THE LAST 5 DAYS. Performed at [facility], [place] [address], [place], [place] [address]    Report Status 02/24/2021 FINAL  Final  Resp Panel by RT-PCR (Flu A&B, Covid) Nasopharyngeal Swab     Status: None   Collection Time: 02/21/21  2:10 PM   Specimen: Nasopharyngeal Swab; Nasopharyngeal(NP) swabs in vial transport medium  Result Value Ref Range Status   SARS Coronavirus 2 by RT PCR NEGATIVE NEGATIVE Final    Comment: (NOTE) SARS-CoV-2 target nucleic acids are NOT DETECTED.  The SARS-CoV-2 RNA is generally detectable in upper respiratory specimens during the acute phase of infection. The lowest concentration of SARS-CoV-2 viral copies this assay can detect is 138 copies/mL. A negative result does not preclude SARS-Cov-2 infection and should not be used as the sole basis for treatment or other patient management decisions. A negative result may occur with  improper specimen collection/handling, submission of specimen other than nasopharyngeal swab, presence of viral mutation(s) within the areas targeted by this assay, and inadequate number of viral copies(<138 copies/mL). A negative result must be combined with clinical observations, patient history, and  epidemiological information. The expected result is Negative.  Fact Sheet for Patients:  EntrepreneurPulse.com.au  Fact Sheet for Healthcare Providers:  IncredibleEmployment.be  This test is no t yet approved or cleared by the [place] FDA and  has been authorized for detection and/or diagnosis of SARS-CoV-2 by FDA under an Emergency Use Authorization (EUA). This EUA will remain  in effect (meaning this test can be used) for the duration of the COVID-19 declaration under Section 564(b)(1) of the Act, 21 U.S.C.section 360bbb-3(b)(1), unless the authorization is terminated  or revoked sooner.       Influenza A by PCR NEGATIVE NEGATIVE Final   Influenza B by PCR NEGATIVE NEGATIVE Final    Comment: (NOTE) The Xpert Xpress SARS-CoV-2/FLU/RSV plus assay is intended as an aid in the diagnosis of influenza from Nasopharyngeal swab specimens and should not be used as a sole basis for treatment. Nasal washings and aspirates are unacceptable for Xpert Xpress SARS-CoV-2/FLU/RSV testing.  Fact Sheet for Patients: EntrepreneurPulse.com.au  Fact Sheet for Healthcare Providers: IncredibleEmployment.be  This test is not yet approved or cleared by the [place] FDA and has been authorized for detection and/or diagnosis of SARS-CoV-2 by FDA under an Emergency Use Authorization (EUA). This EUA will remain in effect (meaning this test can be used) for the duration of the COVID-19 declaration under Section 564(b)(1) of the Act, 21 U.S.C. section 360bbb-3(b)(1), unless the authorization is terminated or revoked.  Performed at [facility], [address] [place], [place] [address]   Blood Culture (routine x 2)     Status: Abnormal   Collection Time: 02/21/21  2:10 PM   Specimen: BLOOD  Result Value Ref Range Status   Specimen Description   Final    BLOOD LEFT ANTECUBITAL Performed at [facility], [address] [place], [place] [address]    Special Requests   Final    BOTTLES DRAWN AEROBIC AND ANAEROBIC Blood Culture adequate  volume Performed at [facility], [address] [place], [place] [address]    Culture  Setup Time   Final    GRAM NEGATIVE RODS IN BOTH AEROBIC AND ANAEROBIC BOTTLES CRITICAL RESULT CALLED TO, READ BACK BY AND VERIFIED WITH: Winfield Rast PATEL ON 02/22/21 AT 0017 MF Performed at [facility], [place] [address] [place], [place] [address]    Culture ESCHERICHIA COLI (A)  Final   Report Status 02/24/2021 FINAL  Final   Organism ID, Bacteria ESCHERICHIA COLI  Final      Susceptibility   Escherichia coli - MIC*    AMPICILLIN <=2 SENSITIVE Sensitive     CEFAZOLIN <=4 SENSITIVE Sensitive     CEFEPIME <=0.12 SENSITIVE Sensitive     CEFTAZIDIME <=1 SENSITIVE Sensitive     CEFTRIAXONE <=0.25 SENSITIVE Sensitive     CIPROFLOXACIN <=0.25 SENSITIVE Sensitive     GENTAMICIN <=1 SENSITIVE Sensitive     IMIPENEM <=0.25 SENSITIVE Sensitive     TRIMETH/SULFA <=20 SENSITIVE Sensitive     AMPICILLIN/SULBACTAM <=2 SENSITIVE Sensitive     PIP/TAZO <=4 SENSITIVE Sensitive     * ESCHERICHIA COLI  Urine culture     Status: None   Collection Time: 02/21/21  2:10 PM   Specimen: In/Out Cath Urine  Result Value Ref Range Status   Specimen Description   Final    IN/OUT CATH URINE Performed at [facility], [address], [place], [place] [address]    Special Requests   Final    NONE Performed at [facility], [address] [place], [place] [address]    Culture   Final    NO GROWTH Performed at [facility], [place] [address], [place], [place] [address]    Report Status 02/23/2021 FINAL  Final  Blood Culture ID Panel (Reflexed)     Status: Abnormal   Collection Time: 02/21/21  2:10 PM  Result Value Ref Range Status   Enterococcus faecalis NOT DETECTED NOT DETECTED Final   Enterococcus Faecium NOT DETECTED NOT DETECTED  Final   Listeria monocytogenes NOT DETECTED NOT DETECTED Final   Staphylococcus species NOT DETECTED NOT DETECTED Final   Staphylococcus aureus (BCID) NOT DETECTED NOT DETECTED Final   Staphylococcus epidermidis NOT DETECTED NOT DETECTED Final   Staphylococcus lugdunensis NOT DETECTED NOT DETECTED Final   Streptococcus species NOT DETECTED NOT DETECTED Final   Streptococcus agalactiae NOT DETECTED NOT DETECTED Final   Streptococcus pneumoniae NOT DETECTED NOT DETECTED Final   Streptococcus pyogenes NOT DETECTED NOT DETECTED Final   A.calcoaceticus-baumannii NOT DETECTED NOT DETECTED Final   Bacteroides fragilis NOT DETECTED NOT DETECTED Final   Enterobacterales DETECTED (A) NOT DETECTED Final    Comment: Enterobacterales represent a large order of gram negative bacteria, not a single organism. CRITICAL RESULT CALLED TO, READ BACK BY AND VERIFIED WITH: Winfield Rast LEPLAT AT 5/02/22/22 MF    Enterobacter cloacae complex NOT DETECTED NOT DETECTED Final   Escherichia coli DETECTED (A) NOT DETECTED Final    Comment: CRITICAL RESULT CALLED TO, READ BACK BY AND VERIFIED WITH: KISHAN LEPLAT AT 0016 02/22/21 MF    Klebsiella aerogenes NOT DETECTED NOT DETECTED Final   Klebsiella oxytoca NOT DETECTED NOT DETECTED Final   Klebsiella pneumoniae NOT DETECTED NOT DETECTED Final   Proteus species NOT DETECTED NOT DETECTED Final   Salmonella species NOT DETECTED NOT DETECTED Final   Serratia marcescens NOT DETECTED NOT DETECTED Final   Haemophilus influenzae NOT DETECTED NOT DETECTED Final   Neisseria  meningitidis NOT DETECTED NOT DETECTED Final   Pseudomonas aeruginosa NOT DETECTED NOT DETECTED Final   Stenotrophomonas maltophilia NOT DETECTED NOT DETECTED Final   Candida albicans NOT DETECTED NOT DETECTED Final   Candida auris NOT DETECTED NOT DETECTED Final   Candida glabrata NOT DETECTED NOT DETECTED Final   Candida krusei NOT DETECTED NOT DETECTED Final   Candida parapsilosis NOT DETECTED NOT  DETECTED Final   Candida tropicalis NOT DETECTED NOT DETECTED Final   Cryptococcus neoformans/gattii NOT DETECTED NOT DETECTED Final   CTX-M ESBL NOT DETECTED NOT DETECTED Final   Carbapenem resistance IMP NOT DETECTED NOT DETECTED Final   Carbapenem resistance KPC NOT DETECTED NOT DETECTED Final   Carbapenem resistance NDM NOT DETECTED NOT DETECTED Final   Carbapenem resist OXA 48 LIKE NOT DETECTED NOT DETECTED Final   Carbapenem resistance VIM NOT DETECTED NOT DETECTED Final    Comment: Performed at [facility], [address] [place], [place] [address]  MRSA PCR Screening     Status: None   Collection Time: 02/22/21  4:24 AM   Specimen: Nasopharyngeal  Result Value Ref Range Status   MRSA by PCR NEGATIVE NEGATIVE Final    Comment:        The GeneXpert MRSA Assay (FDA approved for NASAL specimens only), is one component of a comprehensive MRSA colonization surveillance program. It is not intended to diagnose MRSA infection nor to guide or monitor treatment for MRSA infections. Performed at [facility], [place] [address], [place], [place] [address]          Radiology Studies: No results found.      Scheduled Meds: . diltiazem  30 mg Oral Q6H  . insulin aspart  0-9 Units Subcutaneous Q4H  . potassium chloride  40 mEq Oral Once  . QUEtiapine  25 mg Oral Daily   Continuous Infusions: . ampicillin (OMNIPEN) IV 2 g (02/26/21 1145)  . lactated ringers 75 mL/hr at 02/26/21 0455     LOS: 4 days    Time spent: 35 min    Geradine Girt, DO [facility]   If 7PM-7AM, please contact night-coverage 02/26/2021, 3:04 PM

## 2021-02-26 NOTE — Progress Notes (Signed)
Coughed out  small amount of white thick secretions. Oral suctioning done with yankuer with small thick white secretions. Continue to monitor.

## 2021-02-26 NOTE — Progress Notes (Signed)
Felt nauseous after eating dinner, tol. 30% of the food.zofran iv given.

## 2021-02-26 NOTE — Consult Note (Signed)
Hector Lucero [birth year]  [id number].    Requesting MD: Dr. Eliseo Squires Chief Complaint/Reason for Consult: Cholangitis  HPI: Hector Lucero is a 87 y.o. male with a hx of HTN, HLD, DM2, and SVT who presented to [facility] on 4/15 with abdominal pain, n/v and fever. Patient underwent workup that was concerning for Cholangitis. During workup patient went into SVT that required multiple doses of IV diltiazem. He was started on broad spectrum abx and GI was consulted for Cholangitis. Patient was transferred to [facility] for evaluation by GI. He underwent ERCP with biliary sphincterotomy, balloon extraction and plastic pancreatitic duct stent placement on 4/16 by Dr. Watt Climes. He was found to have E. Coli bacteremia during admission and abx were narrowed after ERCP based on ID's recommendations to Ampicillin with 7 day course total. General surgery was asked to see to consider Laparoscopic Cholecystectomy.   Per notes patient has developed delirium during admission. He is currently alert and orientated to self. He knows he is in a hospital but does not know which one. He thinks he is here for a cough. He believes the year is 25. He denies any abdominal pain, n/v. He is currently on a dysphagia 3 diet and tolerating. He denies any other complaints.   Patient has hx of prior right inguinal hernia repair. Last Echo was 2016 that shwoed EF of 60-65%. He is not on any blood thinners.   ROS: Review of Systems  Unable to perform ROS: Mental status change  Constitutional: Positive for fever.  Respiratory: Positive for cough.   Cardiovascular: Negative for chest pain.  Gastrointestinal: Negative for abdominal pain, nausea and vomiting.    Family History  Problem Relation Age of Onset  . Diabetes Mother     Past Medical History:  Diagnosis Date  . Atrial fibrillation ([place])   . Diabetes mellitus without complication ([place])   . Fatigue   . HLD (hyperlipidemia)   . Hypertension   . OSA (obstructive sleep apnea)    . Radial fracture 2006   right, screws and plates  . SVT (supraventricular tachycardia) ([place]) 2012   a. Holter in 2012 showed 27 episodes of SVT/atrial tach w/ longest being 36 beats; b. asymptomatic  . Syncope 08/2015   a. short rhythm strip in [facility] ED showed junctional escape rhythm and AV dissociation with essentially sinus bradycardia  . Ulnar fracture 1995   fall off of ladder    Past Surgical History:  Procedure Laterality Date  . ERCP Left 02/22/2021   Procedure: ENDOSCOPIC RETROGRADE CHOLANGIOPANCREATOGRAPHY (ERCP);  Surgeon: Clarene Essex, MD;  Location: [place];  Service: Endoscopy;  Laterality: Left;  . HERNIA REPAIR  Aug 2012   right indirect inguinal, with mesh  . PANCREATIC STENT PLACEMENT  02/22/2021   Procedure: PANCREATIC STENT PLACEMENT;  Surgeon: Clarene Essex, MD;  Location: [place];  Service: Endoscopy;;  . REMOVAL OF STONES  02/22/2021   Procedure: REMOVAL OF STONES;  Surgeon: Clarene Essex, MD;  Location: [facility] ENDOSCOPY;  Service: Endoscopy;;  . Joan Mayans  02/22/2021   Procedure: Joan Mayans;  Surgeon: Clarene Essex, MD;  Location: [facility] ENDOSCOPY;  Service: Endoscopy;;    Social History:  reports that he has never smoked. He has never used smokeless tobacco. He reports current alcohol use of about 3.0 standard drinks of alcohol per week. He reports that he does not use drugs.  Allergies: No Known Allergies  Medications Prior to Admission  Medication Sig Dispense Refill  . acetaminophen (TYLENOL) 500 MG tablet  Take 1,000 mg by mouth every 4 (four) hours as needed for mild pain or fever.    . carboxymethylcellulose (ARTIFICIAL TEARS) 1 % ophthalmic solution Apply 1 drop to eye 2 (two) times daily as needed (dry eyes).    . Multiple Vitamins-Minerals (MENS 50+ MULTI VITAMIN/MIN PO) Take 1 tablet by mouth daily.    . vitamin B-12 (CYANOCOBALAMIN) 1000 MCG tablet Take 1,000 mcg by mouth daily.       Physical Exam: Blood pressure 117/69, pulse 69,  temperature 97.7 F (36.5 C), temperature source Oral, resp. rate 19, height 5\' 8"  (1.727 m), weight 70.4 kg, SpO2 92 %. General: pleasant, WD/WN white male who is laying in bed in NAD HEENT: head is normocephalic, atraumatic.  Sclera are noninjected.  PERRL.  Ears and nose without any masses or lesions.  Mouth is pink and moist. Dentition fair Heart: regular, rate, and rhythm.  Normal s1,s2. No obvious murmurs, gallops, or rubs noted.  Palpable pedal pulses bilaterally  Lungs: CTAB, no wheezes, rhonchi, or rales noted.  Respiratory effort nonlabored Abd: Soft, ND, NT, +BS, no masses or organomegaly. Left inguinal hernia that is reducible on exam. No skin changes.  MS: no BUE/BLE edema, calves soft and nontender Skin: warm and dry with no masses, lesions, or rashes Psych: A&Ox4 with an appropriate affect Neuro: cranial nerves grossly intact, equal strength in BUE/BLE bilaterally, normal speech, thought process intact, moves all extremities, gait not assessed.   Results for orders placed or performed during the hospital encounter of 02/22/21 (from the past 48 hour(s))  Glucose, capillary     Status: None   Collection Time: 02/24/21 11:25 AM  Result Value Ref Range   Glucose-Capillary 93 70 - 99 mg/dL    Comment: Glucose reference range applies only to samples taken after fasting for at least 8 hours.  Glucose, capillary     Status: Abnormal   Collection Time: 02/24/21  4:09 PM  Result Value Ref Range   Glucose-Capillary 116 (H) 70 - 99 mg/dL    Comment: Glucose reference range applies only to samples taken after fasting for at least 8 hours.  Glucose, capillary     Status: Abnormal   Collection Time: 02/24/21  8:01 PM  Result Value Ref Range   Glucose-Capillary 132 (H) 70 - 99 mg/dL    Comment: Glucose reference range applies only to samples taken after fasting for at least 8 hours.  Glucose, capillary     Status: Abnormal   Collection Time: 02/24/21 11:53 PM  Result Value Ref Range    Glucose-Capillary 105 (H) 70 - 99 mg/dL    Comment: Glucose reference range applies only to samples taken after fasting for at least 8 hours.  Comprehensive metabolic panel     Status: Abnormal   Collection Time: 02/25/21  2:10 AM  Result Value Ref Range   Sodium 137 135 - 145 mmol/L   Potassium 3.6 3.5 - 5.1 mmol/L   Chloride 107 98 - 111 mmol/L   CO2 24 22 - 32 mmol/L   Glucose, Bld 111 (H) 70 - 99 mg/dL    Comment: Glucose reference range applies only to samples taken after fasting for at least 8 hours.   BUN 10 8 - 23 mg/dL   Creatinine, Ser 0.57 (L) 0.61 - 1.24 mg/dL   Calcium 8.0 (L) 8.9 - 10.3 mg/dL   Total Protein 4.9 (L) 6.5 - 8.1 g/dL   Albumin 2.3 (L) 3.5 - 5.0 g/dL   AST 33 15 -  41 U/L   ALT 53 (H) 0 - 44 U/L   Alkaline Phosphatase 106 38 - 126 U/L   Total Bilirubin 1.9 (H) 0.3 - 1.2 mg/dL   GFR, Estimated >60 >60 mL/min    Comment: (NOTE) Calculated using the CKD-EPI Creatinine Equation (2021)    Anion gap 6 5 - 15    Comment: Performed at [facility] [address] [place], [place] [address]  Magnesium     Status: None   Collection Time: 02/25/21  2:10 AM  Result Value Ref Range   Magnesium 1.9 1.7 - 2.4 mg/dL    Comment: Performed at [facility] [address], [place], [place] [address]  Phosphorus     Status: Abnormal   Collection Time: 02/25/21  2:10 AM  Result Value Ref Range   Phosphorus 2.4 (L) 2.5 - 4.6 mg/dL    Comment: Performed at [facility] [address], [place], [place] [address]  CBC with Differential/Platelet     Status: Abnormal   Collection Time: 02/25/21  2:10 AM  Result Value Ref Range   WBC 7.7 4.0 - 10.5 K/uL   RBC 4.04 (L) 4.22 - 5.81 MIL/uL   Hemoglobin 11.6 (L) 13.0 - 17.0 g/dL   HCT 36.5 (L) 39.0 - 52.0 %   MCV 90.3 80.0 - 100.0 fL   MCH 28.7 26.0 - 34.0 pg   MCHC 31.8 30.0 - 36.0 g/dL   RDW 14.6 11.5 - 15.5 %   Platelets 201 150 - 400 K/uL   nRBC 0.0 0.0 - 0.2 %   Neutrophils Relative % 69 %   Neutro  Abs 5.2 1.7 - 7.7 K/uL   Lymphocytes Relative 19 %   Lymphs Abs 1.5 0.7 - 4.0 K/uL   Monocytes Relative 9 %   Monocytes Absolute 0.7 0.1 - 1.0 K/uL   Eosinophils Relative 2 %   Eosinophils Absolute 0.2 0.0 - 0.5 K/uL   Basophils Relative 0 %   Basophils Absolute 0.0 0.0 - 0.1 K/uL   Immature Granulocytes 1 %   Abs Immature Granulocytes 0.11 (H) 0.00 - 0.07 K/uL    Comment: Performed at [facility] [address] [place], [place] [address]  Glucose, capillary     Status: Abnormal   Collection Time: 02/25/21  4:13 AM  Result Value Ref Range   Glucose-Capillary 117 (H) 70 - 99 mg/dL    Comment: Glucose reference range applies only to samples taken after fasting for at least 8 hours.  Glucose, capillary     Status: Abnormal   Collection Time: 02/25/21  7:37 AM  Result Value Ref Range   Glucose-Capillary 100 (H) 70 - 99 mg/dL    Comment: Glucose reference range applies only to samples taken after fasting for at least 8 hours.  Glucose, capillary     Status: Abnormal   Collection Time: 02/25/21 11:51 AM  Result Value Ref Range   Glucose-Capillary 170 (H) 70 - 99 mg/dL    Comment: Glucose reference range applies only to samples taken after fasting for at least 8 hours.  Glucose, capillary     Status: Abnormal   Collection Time: 02/25/21  4:23 PM  Result Value Ref Range   Glucose-Capillary 116 (H) 70 - 99 mg/dL    Comment: Glucose reference range applies only to samples taken after fasting for at least 8 hours.  Glucose, capillary     Status: Abnormal   Collection Time: 02/25/21  8:05  PM  Result Value Ref Range   Glucose-Capillary 165 (H) 70 - 99 mg/dL    Comment: Glucose reference range applies only to samples taken after fasting for at least 8 hours.   Comment 1 Notify RN    Comment 2 Document in Chart   Glucose, capillary     Status: Abnormal   Collection Time: 02/25/21 11:27 PM  Result Value Ref Range   Glucose-Capillary 133 (H) 70 - 99 mg/dL    Comment: Glucose  reference range applies only to samples taken after fasting for at least 8 hours.   Comment 1 Notify RN    Comment 2 Document in Chart   Comprehensive metabolic panel     Status: Abnormal   Collection Time: 02/26/21  1:10 AM  Result Value Ref Range   Sodium 136 135 - 145 mmol/L   Potassium 3.4 (L) 3.5 - 5.1 mmol/L   Chloride 106 98 - 111 mmol/L   CO2 24 22 - 32 mmol/L   Glucose, Bld 129 (H) 70 - 99 mg/dL    Comment: Glucose reference range applies only to samples taken after fasting for at least 8 hours.   BUN 8 8 - 23 mg/dL   Creatinine, Ser 0.59 (L) 0.61 - 1.24 mg/dL   Calcium 7.7 (L) 8.9 - 10.3 mg/dL   Total Protein 4.6 (L) 6.5 - 8.1 g/dL   Albumin 2.1 (L) 3.5 - 5.0 g/dL   AST 43 (H) 15 - 41 U/L   ALT 54 (H) 0 - 44 U/L   Alkaline Phosphatase 105 38 - 126 U/L   Total Bilirubin 2.1 (H) 0.3 - 1.2 mg/dL   GFR, Estimated >60 >60 mL/min    Comment: (NOTE) Calculated using the CKD-EPI Creatinine Equation (2021)    Anion gap 6 5 - 15    Comment: Performed at [facility], [place] [address] [place], [place] [address]  Magnesium     Status: None   Collection Time: 02/26/21  1:10 AM  Result Value Ref Range   Magnesium 1.9 1.7 - 2.4 mg/dL    Comment: Performed at [facility] [address] [place], [place] [address]  Phosphorus     Status: None   Collection Time: 02/26/21  1:10 AM  Result Value Ref Range   Phosphorus 3.4 2.5 - 4.6 mg/dL    Comment: Performed at [facility] [address] [place],  [address]  CBC with Differential/Platelet     Status: Abnormal   Collection Time: 02/26/21  1:10 AM  Result Value Ref Range   WBC 6.4 4.0 - 10.5 K/uL   RBC 3.96 (L) 4.22 - 5.81 MIL/uL   Hemoglobin 11.5 (L) 13.0 - 17.0 g/dL   HCT 35.1 (L) 39.0 - 52.0 %   MCV 88.6 80.0 - 100.0 fL   MCH 29.0 26.0 - 34.0 pg   MCHC 32.8 30.0 - 36.0 g/dL   RDW 14.5 11.5 - 15.5 %   Platelets 208 150 - 400 K/uL   nRBC 0.0 0.0 - 0.2 %   Neutrophils Relative % 57 %   Neutro Abs  3.7 1.7 - 7.7 K/uL   Lymphocytes Relative 22 %   Lymphs Abs 1.4 0.7 - 4.0 K/uL   Monocytes Relative 12 %   Monocytes Absolute 0.8 0.1 - 1.0 K/uL   Eosinophils Relative 4 %   Eosinophils Absolute 0.2 0.0 - 0.5 K/uL   Basophils Relative 1 %   Basophils Absolute 0.0 0.0 -  0.1 K/uL   Immature Granulocytes 4 %   Abs Immature Granulocytes 0.24 (H) 0.00 - 0.07 K/uL    Comment: Performed at [facility], [place] [address] [place], [place] [address]  Glucose, capillary     Status: Abnormal   Collection Time: 02/26/21  4:25 AM  Result Value Ref Range   Glucose-Capillary 105 (H) 70 - 99 mg/dL    Comment: Glucose reference range applies only to samples taken after fasting for at least 8 hours.   Comment 1 Notify RN    Comment 2 Document in Chart   Glucose, capillary     Status: Abnormal   Collection Time: 02/26/21  8:35 AM  Result Value Ref Range   Glucose-Capillary 114 (H) 70 - 99 mg/dL    Comment: Glucose reference range applies only to samples taken after fasting for at least 8 hours.   No results found.    Assessment/Plan HTN HLD DM2 SVT - required multiple doses of IV diltiazem in ED to convert Delirium Dysphagia - on D3 diet per speech - Per TRH -  E. Coli Bacteremia - On 7 days of abx (ampicillin currently) per ID recs Cholangitis - s/p ERCP with biliary sphincterotomy, balloon extraction and plastic pancreatitic duct stent placement on 4/16 by Dr. Watt Climes.  - We were asked to see [place] Gibson to consider Laparoscopic Cholecystectomy given recent hx of Cholangitis and to prevent reoccurrence. He currently denies any abdominal pain, n/v, is tolerating a dysphagia 3 diet and is non-tender on exam. Discussed with primary team to see if we can get Echo/medical risk stratification in order to better be able to have discussion with family about risks vs benefits of surgery. Will reach out to family later today.   Jillyn Ledger, [facility] 02/26/2021, 11:05  AM Please see Amion for pager number during day hours 7:00am-4:30pm

## 2021-02-27 ENCOUNTER — Inpatient Hospital Stay (HOSPITAL_COMMUNITY): Payer: Medicare Other

## 2021-02-27 DIAGNOSIS — I480 Paroxysmal atrial fibrillation: Secondary | ICD-10-CM

## 2021-02-27 LAB — CBC WITH DIFFERENTIAL/PLATELET
Abs Immature Granulocytes: 0.3 10*3/uL — ABNORMAL HIGH (ref 0.00–0.07)
Basophils Absolute: 0.1 10*3/uL (ref 0.0–0.1)
Basophils Relative: 1 %
Eosinophils Absolute: 0.2 10*3/uL (ref 0.0–0.5)
Eosinophils Relative: 3 %
HCT: 35.5 % — ABNORMAL LOW (ref 39.0–52.0)
Hemoglobin: 11.5 g/dL — ABNORMAL LOW (ref 13.0–17.0)
Immature Granulocytes: 4 %
Lymphocytes Relative: 19 %
Lymphs Abs: 1.4 10*3/uL (ref 0.7–4.0)
MCH: 28.9 pg (ref 26.0–34.0)
MCHC: 32.4 g/dL (ref 30.0–36.0)
MCV: 89.2 fL (ref 80.0–100.0)
Monocytes Absolute: 0.8 10*3/uL (ref 0.1–1.0)
Monocytes Relative: 11 %
Neutro Abs: 4.6 10*3/uL (ref 1.7–7.7)
Neutrophils Relative %: 62 %
Platelets: 221 10*3/uL (ref 150–400)
RBC: 3.98 MIL/uL — ABNORMAL LOW (ref 4.22–5.81)
RDW: 14.6 % (ref 11.5–15.5)
WBC: 7.4 10*3/uL (ref 4.0–10.5)
nRBC: 0 % (ref 0.0–0.2)

## 2021-02-27 LAB — ECHOCARDIOGRAM COMPLETE
Area-P 1/2: 2.69 cm2
Height: 68 in
S' Lateral: 2.8 cm
Weight: 2483.26 oz

## 2021-02-27 LAB — COMPREHENSIVE METABOLIC PANEL
ALT: 48 U/L — ABNORMAL HIGH (ref 0–44)
AST: 31 U/L (ref 15–41)
Albumin: 2 g/dL — ABNORMAL LOW (ref 3.5–5.0)
Alkaline Phosphatase: 107 U/L (ref 38–126)
Anion gap: 3 — ABNORMAL LOW (ref 5–15)
BUN: 8 mg/dL (ref 8–23)
CO2: 25 mmol/L (ref 22–32)
Calcium: 7.6 mg/dL — ABNORMAL LOW (ref 8.9–10.3)
Chloride: 110 mmol/L (ref 98–111)
Creatinine, Ser: 0.68 mg/dL (ref 0.61–1.24)
GFR, Estimated: >60 mL/min (ref >60)
Glucose, Bld: 122 mg/dL — ABNORMAL HIGH (ref 70–99)
Potassium: 3.7 mmol/L (ref 3.5–5.1)
Sodium: 138 mmol/L (ref 135–145)
Total Bilirubin: 1.6 mg/dL — ABNORMAL HIGH (ref 0.3–1.2)
Total Protein: 4.6 g/dL — ABNORMAL LOW (ref 6.5–8.1)

## 2021-02-27 LAB — GLUCOSE, CAPILLARY
Glucose-Capillary: 107 mg/dL — ABNORMAL HIGH (ref 70–99)
Glucose-Capillary: 117 mg/dL — ABNORMAL HIGH (ref 70–99)
Glucose-Capillary: 118 mg/dL — ABNORMAL HIGH (ref 70–99)
Glucose-Capillary: 124 mg/dL — ABNORMAL HIGH (ref 70–99)
Glucose-Capillary: 164 mg/dL — ABNORMAL HIGH (ref 70–99)
Glucose-Capillary: 208 mg/dL — ABNORMAL HIGH (ref 70–99)

## 2021-02-27 LAB — PHOSPHORUS: Phosphorus: 3 mg/dL (ref 2.5–4.6)

## 2021-02-27 LAB — MAGNESIUM: Magnesium: 1.9 mg/dL (ref 1.7–2.4)

## 2021-02-27 MED ORDER — INSULIN ASPART 100 UNIT/ML ~~LOC~~ SOLN
0.0000 [IU] | Freq: Three times a day (TID) | SUBCUTANEOUS | Status: DC
  Administered 2021-02-27: 2 [IU] via SUBCUTANEOUS
  Administered 2021-02-28: 1 [IU] via SUBCUTANEOUS
  Administered 2021-02-28: 2 [IU] via SUBCUTANEOUS

## 2021-02-27 MED ORDER — INSULIN ASPART 100 UNIT/ML ~~LOC~~ SOLN: 0.0000 [IU] | Freq: Every day | SUBCUTANEOUS | Status: DC

## 2021-02-27 MED ORDER — QUETIAPINE 12.5 MG HALF TABLET
12.5000 mg | ORAL_TABLET | Freq: Every day | ORAL | Status: DC
  Administered 2021-02-28: 12.5 mg via ORAL
  Filled 2021-02-27: qty 1

## 2021-02-27 NOTE — Progress Notes (Signed)
PROGRESS NOTE    KAIEA Lucero  WER:[id number] DOB: 03-21-1928 DOA: 02/22/2021 PCP: Venia Carbon, MD     Brief Narrative:  Hector Lucero is a 87 y.o. WM PMHx Paroxysmal Atrial Fibrillation, SVT, HTN, DM type II diet controlled.  HLD, OSA, Presented to ED at [facility] with c/o abd pain.  Found to have cholangitis and is s/p stent   Subjective: More confused this AM   Assessment & Plan:   Principal Problem:   Ascending cholangitis Active Problems:   Diabetes mellitus type 2, diet-controlled ([place])   Vascular dementia without behavioral disturbance ([place])   Choledocholithiasis with acute cholecystitis with obstruction   E coli bacteremia   PAF (paroxysmal atrial fibrillation) (HCC)   Acute gallstone pancreatitis   Abnormal radiologic findings on diagnostic imaging of renal pelvis, ureter, or bladder   Sepsis due to Escherichia coli (E. coli) ([place])   Choledocholithiasis with acute cholecystitis with obstruction/cholangitis -7mm CBD stone requiring ERCP - 4/16 s/p ERCP see results below - Continue current antibiotics for total of 7 days per ID recommendation - 4/17 GI signed off but recommended surgical consult for consideration of cholecystectomy - general surgery consult -no h/o CAD-- does have a fib-- score is zero on Revised Cardiac Risk Index for Pre-Operative Risk -echo pending/EKG normal  Reported Sepsis - Upon admission patient does not meet sepsis criteria per Dr. Sherral Hammers  Bacteremia positive E. Coli - Continue  course antibiotics  Lactic acidosis -Resolved  Hypokalemia - Potassium goal> 4 - 4/19 K-Phos 20 mmol  Hypophosphatemia - Phosphorus goal> 5 - See hypokalemia  Delirium/ Agitation  - Haldol PRN -Seroquel 25 mg daily  Diabetes type 2 controlled without complication - 1/95 hemoglobin A1c= 7.0 - Sensitive SSI   HLD -4/18 LDL= 92  Dysphagia? - DYS diet ordered by SLP - Incentive spirometer (patient probably will not be able to  cooperate) -Flutter valve  (patient probably will not be able to cooperate)   DVT prophylaxis: SCD Code Status: DNR Family Communication: called family but no answer Status is: Inpatient    Dispo: The patient is from: Home              Anticipated d/c is to: Home                   Consultants:  Dr. Hilarie Fredrickson GI Dr. Carlyle Basques, ID General Surgery   Procedures/Significant Events:  4/16 ERCP   Cultures 4/15 blood LEFT wrist positive E. coli 4/15 blood LEFT AC positive E. coli    Continuous Infusions: . ampicillin (OMNIPEN) IV 2 g (02/27/21 0844)  . lactated ringers 75 mL/hr at 02/26/21 1949     Objective: Vitals:   02/27/21 0014 02/27/21 0300 02/27/21 0400 02/27/21 0602  BP: 134/71 129/71 132/75 115/73  Pulse:  77 74   Resp:  15 16   Temp:   98.4 F (36.9 C)   TempSrc:   Oral   SpO2:  98% 96%   Weight:      Height:        Intake/Output Summary (Last 24 hours) at 02/27/2021 1153 Last data filed at 02/27/2021 1100 Gross per 24 hour  Intake 2059.6 ml  Output 2075 ml  Net -15.4 ml   Filed Weights   02/22/21 0216  Weight: 70.4 kg    Examination: Physical Exam:  General: Appearance:   elderly male in no acute distress     Lungs:     respirations unlabored  Heart:  Normal heart rate.   MS:   All extremities are intact.   Neurologic:   Awake, alert- confused to situation      CBC: Recent Labs  Lab 02/23/21 0040 02/24/21 0044 02/25/21 0210 02/26/21 0110 02/27/21 0112  WBC 8.4 8.9 7.7 6.4 7.4  NEUTROABS 7.2 7.2 5.2 3.7 4.6  HGB 11.6* 10.8* 11.6* 11.5* 11.5*  HCT 35.9* 32.4* 36.5* 35.1* 35.5*  MCV 90.2 87.6 90.3 88.6 89.2  PLT 175 184 201 208 357   Basic Metabolic Panel: Recent Labs  Lab 02/23/21 0040 02/24/21 0044 02/25/21 0210 02/26/21 0110 02/27/21 0112  NA 134* 136 137 136 138  K 3.6  3.6 3.6 3.6 3.4* 3.7  CL 104 109 107 106 110  CO2 23 23 24 24 25   GLUCOSE 146* 128* 111* 129* 122*  BUN 11 13 10 8 8   CREATININE 0.77  0.66 0.57* 0.59* 0.68  CALCIUM 8.1* 7.9* 8.0* 7.7* 7.6*  MG 2.0  2.0 2.1 1.9 1.9 1.9  PHOS 2.0* 1.2* 2.4* 3.4 3.0   GFR: Estimated Creatinine Clearance: 58.2 mL/min (by C-G formula based on SCr of 0.68 mg/dL). Liver Function Tests: Recent Labs  Lab 02/23/21 0040 02/24/21 0044 02/25/21 0210 02/26/21 0110 02/27/21 0112  AST 47* 28 33 43* 31  ALT 81* 58* 53* 54* 48*  ALKPHOS 127* 108 106 105 107  BILITOT 5.5* 2.7* 1.9* 2.1* 1.6*  PROT 5.3* 5.0* 4.9* 4.6* 4.6*  ALBUMIN 2.5* 2.4* 2.3* 2.1* 2.0*   Recent Labs  Lab 02/21/21 1351  LIPASE 1,247*   No results for input(s): AMMONIA in the last 168 hours. Coagulation Profile: Recent Labs  Lab 02/21/21 1410  INR 1.2   Cardiac Enzymes: No results for input(s): CKTOTAL, CKMB, CKMBINDEX, TROPONINI in the last 168 hours. BNP (last 3 results) No results for input(s): PROBNP in the last 8760 hours. HbA1C: No results for input(s): HGBA1C in the last 72 hours. CBG: Recent Labs  Lab 02/26/21 1927 02/27/21 0002 02/27/21 0425 02/27/21 0742 02/27/21 1117  GLUCAP 145* 107* 117* 118* 208*   Lipid Profile: No results for input(s): CHOL, HDL, LDLCALC, TRIG, CHOLHDL, LDLDIRECT in the last 72 hours. Thyroid Function Tests: No results for input(s): TSH, T4TOTAL, FREET4, T3FREE, THYROIDAB in the last 72 hours. Anemia Panel: No results for input(s): VITAMINB12, FOLATE, FERRITIN, TIBC, IRON, RETICCTPCT in the last 72 hours. Sepsis Labs: Recent Labs  Lab 02/21/21 1351 02/21/21 1732 02/22/21 0852 02/22/21 1428 02/23/21 0040 02/24/21 0044  PROCALCITON  --   --  21.76  --  15.38 8.93  LATICACIDVEN 2.2* 1.5 1.3 1.2  --   --     Recent Results (from the past 240 hour(s))  Blood Culture (routine x 2)     Status: Abnormal   Collection Time: 02/21/21  1:51 PM   Specimen: BLOOD  Result Value Ref Range Status   Specimen Description   Final    BLOOD BLOOD LEFT WRIST Performed at [facility], [address], [place],  [place] [address]    Special Requests   Final    BOTTLES DRAWN AEROBIC AND ANAEROBIC Blood Culture adequate volume Performed at [facility], [address] [place], [place] [address]    Culture  Setup Time   Final    GRAM NEGATIVE RODS IN BOTH AEROBIC AND ANAEROBIC BOTTLES CRITICAL VALUE NOTED.  VALUE IS CONSISTENT WITH PREVIOUSLY REPORTED AND CALLED VALUE. Performed at [facility], [address] [place], [place] [address]    Culture (A)  Final    ESCHERICHIA COLI SUSCEPTIBILITIES PERFORMED ON PREVIOUS CULTURE WITHIN THE LAST 5 DAYS. Performed at [facility], [place] [address], [place], [place] [address]    Report Status 02/24/2021 FINAL  Final  Resp Panel by RT-PCR (Flu A&B, Covid) Nasopharyngeal Swab     Status: None   Collection Time: 02/21/21  2:10 PM   Specimen: Nasopharyngeal Swab; Nasopharyngeal(NP) swabs in vial transport medium  Result Value Ref Range Status   SARS Coronavirus 2 by RT PCR NEGATIVE NEGATIVE Final    Comment: (NOTE) SARS-CoV-2 target nucleic acids are NOT DETECTED.  The SARS-CoV-2 RNA is generally detectable in upper respiratory specimens during the acute phase of infection. The lowest concentration of SARS-CoV-2 viral copies this assay can detect is 138 copies/mL. A negative result does not preclude SARS-Cov-2 infection and should not be used as the sole basis for treatment or other patient management decisions. A negative result may occur with  improper specimen collection/handling, submission of specimen other than nasopharyngeal swab, presence of viral mutation(s) within the areas targeted by this assay, and inadequate number of viral copies(<138 copies/mL). A negative result must be combined with clinical observations, patient history, and epidemiological information. The expected result is Negative.  Fact Sheet for Patients:  EntrepreneurPulse.com.au  Fact Sheet for Healthcare Providers:   IncredibleEmployment.be  This test is no t yet approved or cleared by the [place] FDA and  has been authorized for detection and/or diagnosis of SARS-CoV-2 by FDA under an Emergency Use Authorization (EUA). This EUA will remain  in effect (meaning this test can be used) for the duration of the COVID-19 declaration under Section 564(b)(1) of the Act, 21 U.S.C.section 360bbb-3(b)(1), unless the authorization is terminated  or revoked sooner.       Influenza A by PCR NEGATIVE NEGATIVE Final   Influenza B by PCR NEGATIVE NEGATIVE Final    Comment: (NOTE) The Xpert Xpress SARS-CoV-2/FLU/RSV plus assay is intended as an aid in the diagnosis of influenza from Nasopharyngeal swab specimens and should not be used as a sole basis for treatment. Nasal washings and aspirates are unacceptable for Xpert Xpress SARS-CoV-2/FLU/RSV testing.  Fact Sheet for Patients: EntrepreneurPulse.com.au  Fact Sheet for Healthcare Providers: IncredibleEmployment.be  This test is not yet approved or cleared by the [place] FDA and has been authorized for detection and/or diagnosis of SARS-CoV-2 by FDA under an Emergency Use Authorization (EUA). This EUA will remain in effect (meaning this test can be used) for the duration of the COVID-19 declaration under Section 564(b)(1) of the Act, 21 U.S.C. section 360bbb-3(b)(1), unless the authorization is terminated or revoked.  Performed at [facility], [address] [place], [place] [address]   Blood Culture (routine x 2)     Status: Abnormal   Collection Time: 02/21/21  2:10 PM   Specimen: BLOOD  Result Value Ref Range Status   Specimen Description   Final    BLOOD LEFT ANTECUBITAL Performed at [facility], [address] [place], [place] [address]    Special Requests   Final    BOTTLES DRAWN AEROBIC AND ANAEROBIC Blood Culture adequate volume Performed at [facility], [place],  [address]    Culture  Setup Time   Final    GRAM NEGATIVE RODS IN BOTH AEROBIC AND ANAEROBIC BOTTLES CRITICAL RESULT CALLED TO, READ BACK BY AND VERIFIED WITH: Winfield Rast PATEL ON 02/22/21 AT 0017 MF Performed at [facility], [place] [address],  [place], [place] [address]    Culture ESCHERICHIA COLI (A)  Final   Report Status 02/24/2021 FINAL  Final   Organism ID, Bacteria ESCHERICHIA COLI  Final      Susceptibility   Escherichia coli - MIC*    AMPICILLIN <=2 SENSITIVE Sensitive     CEFAZOLIN <=4 SENSITIVE Sensitive     CEFEPIME <=0.12 SENSITIVE Sensitive     CEFTAZIDIME <=1 SENSITIVE Sensitive     CEFTRIAXONE <=0.25 SENSITIVE Sensitive     CIPROFLOXACIN <=0.25 SENSITIVE Sensitive     GENTAMICIN <=1 SENSITIVE Sensitive     IMIPENEM <=0.25 SENSITIVE Sensitive     TRIMETH/SULFA <=20 SENSITIVE Sensitive     AMPICILLIN/SULBACTAM <=2 SENSITIVE Sensitive     PIP/TAZO <=4 SENSITIVE Sensitive     * ESCHERICHIA COLI  Urine culture     Status: None   Collection Time: 02/21/21  2:10 PM   Specimen: In/Out Cath Urine  Result Value Ref Range Status   Specimen Description   Final    IN/OUT CATH URINE Performed at [facility], [address], [place], [place] [address]    Special Requests   Final    NONE Performed at [facility], [address], [place], [place] [address]    Culture   Final    NO GROWTH Performed at [facility], [place] [address] [place], [place] [address]    Report Status 02/23/2021 FINAL  Final  Blood Culture ID Panel (Reflexed)     Status: Abnormal   Collection Time: 02/21/21  2:10 PM  Result Value Ref Range Status   Enterococcus faecalis NOT DETECTED NOT DETECTED Final   Enterococcus Faecium NOT DETECTED NOT DETECTED Final   Listeria monocytogenes NOT DETECTED NOT DETECTED Final   Staphylococcus species NOT DETECTED NOT DETECTED Final   Staphylococcus aureus (BCID) NOT DETECTED NOT DETECTED  Final   Staphylococcus epidermidis NOT DETECTED NOT DETECTED Final   Staphylococcus lugdunensis NOT DETECTED NOT DETECTED Final   Streptococcus species NOT DETECTED NOT DETECTED Final   Streptococcus agalactiae NOT DETECTED NOT DETECTED Final   Streptococcus pneumoniae NOT DETECTED NOT DETECTED Final   Streptococcus pyogenes NOT DETECTED NOT DETECTED Final   A.calcoaceticus-baumannii NOT DETECTED NOT DETECTED Final   Bacteroides fragilis NOT DETECTED NOT DETECTED Final   Enterobacterales DETECTED (A) NOT DETECTED Final    Comment: Enterobacterales represent a large order of gram negative bacteria, not a single organism. CRITICAL RESULT CALLED TO, READ BACK BY AND VERIFIED WITH: KISHAN LEPLAT AT 5/02/22/22 MF    Enterobacter cloacae complex NOT DETECTED NOT DETECTED Final   Escherichia coli DETECTED (A) NOT DETECTED Final    Comment: CRITICAL RESULT CALLED TO, READ BACK BY AND VERIFIED WITH: KISHAN LEPLAT AT 0016 02/22/21 MF    Klebsiella aerogenes NOT DETECTED NOT DETECTED Final   Klebsiella oxytoca NOT DETECTED NOT DETECTED Final   Klebsiella pneumoniae NOT DETECTED NOT DETECTED Final   Proteus species NOT DETECTED NOT DETECTED Final   Salmonella species NOT DETECTED NOT DETECTED Final   Serratia marcescens NOT DETECTED NOT DETECTED Final   Haemophilus influenzae NOT DETECTED NOT DETECTED Final   Neisseria meningitidis NOT DETECTED NOT DETECTED Final   Pseudomonas aeruginosa NOT DETECTED NOT DETECTED Final   Stenotrophomonas maltophilia NOT DETECTED NOT DETECTED Final   Candida albicans NOT DETECTED NOT DETECTED Final   Candida auris NOT DETECTED NOT DETECTED Final   Candida glabrata NOT DETECTED NOT DETECTED Final   Candida krusei NOT DETECTED NOT DETECTED Final  Candida parapsilosis NOT DETECTED NOT DETECTED Final   Candida tropicalis NOT DETECTED NOT DETECTED Final   Cryptococcus neoformans/gattii NOT DETECTED NOT DETECTED Final   CTX-M ESBL NOT DETECTED NOT DETECTED Final    Carbapenem resistance IMP NOT DETECTED NOT DETECTED Final   Carbapenem resistance KPC NOT DETECTED NOT DETECTED Final   Carbapenem resistance NDM NOT DETECTED NOT DETECTED Final   Carbapenem resist OXA 48 LIKE NOT DETECTED NOT DETECTED Final   Carbapenem resistance VIM NOT DETECTED NOT DETECTED Final    Comment: Performed at [facility], [address] [place], [place] [address]  MRSA PCR Screening     Status: None   Collection Time: 02/22/21  4:24 AM   Specimen: Nasopharyngeal  Result Value Ref Range Status   MRSA by PCR NEGATIVE NEGATIVE Final    Comment:        The GeneXpert MRSA Assay (FDA approved for NASAL specimens only), is one component of a comprehensive MRSA colonization surveillance program. It is not intended to diagnose MRSA infection nor to guide or monitor treatment for MRSA infections. Performed at [facility], [place] [address], [place], [place] [address]          Radiology Studies: No results found.      Scheduled Meds: . diltiazem  30 mg Oral Q6H  . insulin aspart  0-9 Units Subcutaneous Q4H  . QUEtiapine  25 mg Oral Daily   Continuous Infusions: . ampicillin (OMNIPEN) IV 2 g (02/27/21 0844)  . lactated ringers 75 mL/hr at 02/26/21 1949     LOS: 5 days    Time spent: 35 min    Geradine Girt, DO [facility]   If 7PM-7AM, please contact night-coverage 02/27/2021, 11:53 AM

## 2021-02-27 NOTE — Progress Notes (Signed)
desats to 70's on room air  while sleeping. Placed on o2 2l  [place] sat-94%. Continue to monitor.

## 2021-02-27 NOTE — Progress Notes (Signed)
  Echocardiogram 2D Echocardiogram has been performed.  Hector Lucero 02/27/2021, 4:32 PM

## 2021-02-27 NOTE — Progress Notes (Addendum)
5 Days Post-Op  Subjective: CC: A&O x1 this morning (self). Very pleasant. Denies abdominal pain, n/v. About to eat breakfast. Per notes, had some nausea after dinner yesterday.   Objective: Vital signs in last 24 hours: Temp:  [98 F (36.7 C)-98.5 F (36.9 C)] 98.4 F (36.9 C) (04/21 0400) Pulse Rate:  [73-81] 74 (04/21 0400) Resp:  [15-20] 16 (04/21 0400) BP: (111-157)/(65-84) 115/73 (04/21 0602) SpO2:  [91 %-98 %] 96 % (04/21 0400) Last BM Date: 02/23/21  Intake/Output from previous day: 04/20 0701 - 04/21 0700 In: 2034.6 [P.O.:270; I.V.:1564.6; IV Piggyback:200] Out: 2675 [Urine:2675] Intake/Output this shift: No intake/output data recorded.  PE: Gen:  Alert, NAD, pleasant Card:  RRR Pulm:  CTAB, no W/R/R, effort normal Abd: Abd: Soft, ND, NT, +BS, no masses or organomegaly. Left inguinal hernia that is reducible on exam. No skin changes.  Ext:  No LE edema. SCDs in place Psych: A&Ox1 Skin: no rashes noted, warm and dry   Lab Results:  Recent Labs    02/26/21 0110 02/27/21 0112  WBC 6.4 7.4  HGB 11.5* 11.5*  HCT 35.1* 35.5*  PLT 208 221   BMET Recent Labs    02/26/21 0110 02/27/21 0112  NA 136 138  K 3.4* 3.7  CL 106 110  CO2 24 25  GLUCOSE 129* 122*  BUN 8 8  CREATININE 0.59* 0.68  CALCIUM 7.7* 7.6*   PT/INR No results for input(s): LABPROT, INR in the last 72 hours. CMP     Component Value Date/Time   NA 138 02/27/2021 0112   NA 142 09/10/2015 1441   NA 141 09/09/2014 1337   K 3.7 02/27/2021 0112   K 3.8 09/09/2014 1337   CL 110 02/27/2021 0112   CL 105 09/09/2014 1337   CO2 25 02/27/2021 0112   CO2 28 09/09/2014 1337   GLUCOSE 122 (H) 02/27/2021 0112   GLUCOSE 163 (H) 09/09/2014 1337   BUN 8 02/27/2021 0112   BUN 14 09/10/2015 1441   BUN 17 09/09/2014 1337   CREATININE 0.68 02/27/2021 0112   CREATININE 0.94 09/09/2014 1337   CREATININE 0.77 04/18/2012 0822   CALCIUM 7.6 (L) 02/27/2021 0112   CALCIUM 8.4 (L) 09/09/2014 1337    PROT 4.6 (L) 02/27/2021 0112   PROT 7.1 09/09/2014 1337   ALBUMIN 2.0 (L) 02/27/2021 0112   ALBUMIN 3.7 09/09/2014 1337   AST 31 02/27/2021 0112   AST 29 09/09/2014 1337   ALT 48 (H) 02/27/2021 0112   ALT 26 09/09/2014 1337   ALKPHOS 107 02/27/2021 0112   ALKPHOS 87 09/09/2014 1337   BILITOT 1.6 (H) 02/27/2021 0112   BILITOT 0.6 09/09/2014 1337   GFRNONAA >60 02/27/2021 0112   GFRNONAA >60 09/09/2014 1337   GFRNONAA >60 05/14/2014 0948   GFRNONAA 85 04/18/2012 0822   GFRAA >60 07/10/2018 2146   GFRAA >60 09/09/2014 1337   GFRAA >60 05/14/2014 0948   GFRAA >89 04/18/2012 0822   Lipase     Component Value Date/Time   LIPASE 1,247 (H) 02/21/2021 1351       Studies/Results: No results found.  Anti-infectives: Anti-infectives (From admission, onward)   Start     Dose/Rate Route Frequency Ordered Stop   02/25/21 1200  ampicillin (OMNIPEN) 2 g in sodium chloride 0.9 % 100 mL IVPB        2 g 300 mL/hr over 20 Minutes Intravenous Every 4 hours 02/25/21 0855 02/27/21 2359   02/22/21 0400  cefTRIAXone (ROCEPHIN) 2 g  in sodium chloride 0.9 % 100 mL IVPB  Status:  Discontinued        2 g 200 mL/hr over 30 Minutes Intravenous Daily 02/22/21 0354 02/25/21 0855       Assessment/Plan HTN HLD DM2 SVT - required multiple doses of IV diltiazem in ED to convert Delirium Dysphagia - on D3 diet per speech - Per TRH -  Left inguinal hernia - noted on CT scan 4/15. "Left inguinal hernia containing a short loop of small bowel with no findings to suggest associated ischemia or bowel obstruction." Reducible and non-tender on exam. No skin changes.   E. Coli Bacteremia - On 7 days of abx (ampicillin currently) per ID recs Cholangitis - s/p ERCP with biliary sphincterotomy, balloon extraction and plastic pancreatitic duct stent placement on 4/16 by Dr. Watt Climes.  - We were asked to see Urban Gibson to consider Laparoscopic Cholecystectomy given recent hx of Cholangitis and to prevent  reoccurrence. He currently denies any abdominal pain, n/v, is tolerating a dysphagia 3 diet and is non-tender on exam. WBC wnl. LFT's downtrending. Primary team obtaining Echo for medical risk stratification in order to better be able to have discussion with family about risks vs benefits of surgery. Will reach out to family later today again. Spoke with patients nephew, [name], yesterday.   LOS: 5 days    Jillyn Ledger , [facility] 02/27/2021, 7:36 AM Please see Amion for pager number during day hours 7:00am-4:30pm

## 2021-02-27 NOTE — Progress Notes (Signed)
SLP Cancellation Note  Patient Details Name: Hector Lucero MRN: [id number] DOB: Dec 20, 1927   Cancelled treatment:       Reason Eval/Treat Not Completed: Patient at procedure or test/unavailable   Hector Lucero, [place], [place] Office Number: [phone]  Hector Lucero 02/27/2021, 3:37 PM

## 2021-02-27 NOTE — Progress Notes (Signed)
  Echocardiogram 2D Echocardiogram has been attempted. Will come back at later time per RN request.  Randa Lynn Lacee Grey 02/27/2021, 1:53 PM

## 2021-02-27 NOTE — Evaluation (Signed)
Physical Therapy Evaluation Patient Details Name: Hector Lucero MRN: [id number] DOB: 1928-09-03 Today's Date: 02/27/2021   History of Present Illness  87 yo admitted 4/15 from [facility] with abdominal pain s/p ERCP with stent for cholangitis. PMHx: HTN, HLD, DM, SVT, dysphagia  Clinical Impression  Pt pleasantly confused with initial increased time to maintain arousal as pt drifting in and out of sleep. Pt unreliable historian and nephew provided information via phone. Pt normally walks without AD and is able to move about at facility. Pt currently requires assist for all transfers, gait and function and will benefit from acute therapy to maximize mobility, safety and function to decrease burden of care.  HR 89 with brief spike to 145 with initial transition to EOb but only lasted 10 sec     Follow Up Recommendations SNF;Supervision for mobility/OOB (return to SNF with therapy to regain function)    Equipment Recommendations  Rolling walker with 5" wheels    Recommendations for Other Services       Precautions / Restrictions Precautions Precautions: Fall Restrictions Weight Bearing Restrictions: No      Mobility  Bed Mobility Overal bed mobility: Needs Assistance Bed Mobility: Supine to Sit     Supine to sit: Min assist     General bed mobility comments: min assist to elevate trunk from surface    Transfers Overall transfer level: Needs assistance   Transfers: Sit to/from Stand Sit to Stand: Min assist         General transfer comment: cues for hand placement and safety with assist to rise from surface  Ambulation/Gait Ambulation/Gait assistance: Min assist Gait Distance (Feet): 15 Feet Assistive device: Rolling walker (2 wheeled) Gait Pattern/deviations: Step-through pattern;Decreased stride length;Trunk flexed   Gait velocity interpretation: <1.8 ft/sec, indicate of risk for recurrent falls General Gait Details: cues for posture, direction, position in  RW and assist to direct and move RW  Stairs            Wheelchair Mobility    Modified Rankin (Stroke Patients Only)       Balance Overall balance assessment: Needs assistance Sitting-balance support: No upper extremity supported Sitting balance-Leahy Scale: Fair     Standing balance support: Bilateral upper extremity supported Standing balance-Leahy Scale: Poor Standing balance comment: RW in standing and for gait                             Pertinent Vitals/Pain Pain Assessment: No/denies pain    Home Living Family/patient expects to be discharged to:: [place]: None      Prior Function Level of Independence: Needs assistance   Gait / Transfers Assistance Needed: per nephew was getting to bathroom and around in the room on his own.  ADL's / Homemaking Assistance Needed: nephew was unsure of assist for ADLs staff does all the homemaking, pt was attending daily activities at the center        Hand Dominance        Extremity/Trunk Assessment   Upper Extremity Assessment Upper Extremity Assessment: Generalized weakness    Lower Extremity Assessment Lower Extremity Assessment: Generalized weakness    Cervical / Trunk Assessment Cervical / Trunk Assessment: Kyphotic  Communication   Communication: HOH  Cognition Arousal/Alertness: Lethargic Behavior During Therapy: Flat affect Overall Cognitive Status: Impaired/Different from baseline Area of Impairment: Orientation;Attention;Memory;Following commands;Safety/judgement  Orientation Level: Disoriented to;Time;Place;Situation Current Attention Level: Focused Memory: Decreased recall of precautions;Decreased short-term memory Following Commands: Follows one step commands inconsistently Safety/Judgement: Decreased awareness of deficits;Decreased awareness of safety     General Comments: pt stating he lives with his  parents and plays baseball. nephew reports baseline dementia but able to perform functional tasks like toileting and feeding himself      General Comments      Exercises     Assessment/Plan    PT Assessment Patient needs continued PT services  PT Problem List Decreased strength;Decreased mobility;Decreased safety awareness;Decreased activity tolerance;Decreased cognition;Decreased balance;Decreased knowledge of use of DME;Decreased coordination       PT Treatment Interventions DME instruction;Therapeutic exercise;Gait training;Balance training;Functional mobility training;Therapeutic activities;Patient/family education;Cognitive remediation    PT Goals (Current goals can be found in the Care Plan section)  Acute Rehab PT Goals Patient Stated Goal: return to activities at [facility] PT Goal Formulation: With family Time For Goal Achievement: 03/13/21 Potential to Achieve Goals: Fair    Frequency Min 2X/week   Barriers to discharge Decreased caregiver support      Co-evaluation               AM-PAC PT "6 Clicks" Mobility  Outcome Measure Help needed turning from your back to your side while in a flat bed without using bedrails?: A Little Help needed moving from lying on your back to sitting on the side of a flat bed without using bedrails?: A Little Help needed moving to and from a bed to a chair (including a wheelchair)?: A Little Help needed standing up from a chair using your arms (e.g., wheelchair or bedside chair)?: A Little Help needed to walk in hospital room?: A Little Help needed climbing 3-5 steps with a railing? : A Lot 6 Click Score: 17    End of Session Equipment Utilized During Treatment: Gait belt Activity Tolerance: Patient tolerated treatment well Patient left: in chair;with call bell/phone within reach;with chair alarm set Nurse Communication: Mobility status PT Visit Diagnosis: Other abnormalities of gait and mobility (R26.89);Difficulty in  walking, not elsewhere classified (R26.2);Muscle weakness (generalized) (M62.81)    Time: 1601-0932 PT Time Calculation (min) (ACUTE ONLY): 21 min   Charges:   PT Evaluation $PT Eval Moderate Complexity: 1 Mod          Ravenna Legore P, PT Acute Rehabilitation Services Pager: [phone] Office: [phone]   Sandy Salaam Cheng Dec 02/27/2021, 11:37 AM

## 2021-02-27 NOTE — Plan of Care (Signed)

## 2021-02-27 NOTE — NC FL2 (Signed)
[place] LEVEL OF CARE SCREENING TOOL     IDENTIFICATION  Patient Name: Hector Lucero Birthdate: [birth year] Sex: male Admission Date (Current Location): 02/22/2021  [facility] and [place] Number:  Herbalist and Address:  [facility] [place], [place] [address] [place], [place] [address]      Provider Number: [id number]  Attending Physician Name and Address:  Geradine Girt, DO  Relative Name and Phone Number:  Tyller, Bowlby (Spouse)   [phone]    Current Level of Care: Hospital Recommended Level of Care: Other (Comment) ([place]) Prior Approval Number:    Date Approved/Denied:   PASRR Number:    Discharge Plan: Other (Comment) (ILF)    Current Diagnoses: Patient Active Problem List   Diagnosis Date Noted  . Ascending cholangitis 02/22/2021  . Choledocholithiasis with acute cholecystitis with obstruction 02/22/2021  . E coli bacteremia 02/22/2021  . PAF (paroxysmal atrial fibrillation) ([place]) 02/22/2021  . Acute gallstone pancreatitis 02/22/2021  . Abnormal radiologic findings on diagnostic imaging of renal pelvis, ureter, or bladder 02/22/2021  . Sepsis due to Escherichia coli (E. coli) ([place]) 02/22/2021  . Chest wall pain 03/12/2017  . Advance directive discussed with patient 01/02/2016  . SVT (supraventricular tachycardia) ([place]) 01/15/2015  . Vascular dementia without behavioral disturbance ([place]) 09/16/2014  . Cancer of prostate w/low recurrence risk (T1-2a, Gleason<7 & PSA<10) ([place]) 03/09/2014  . Routine general medical examination at a health care facility 06/23/2013  . Diabetes mellitus type 2, diet-controlled ([place]) 12/24/2011  . Hyperlipidemia with target LDL less than 70 12/24/2011    Orientation RESPIRATION BLADDER Height & Weight     Self  O2 (2 L/min Nasal Cannula) External catheter,Incontinent Weight: 155 lb 3.3 oz (70.4 kg) Height:  5\' 8"  (172.7 cm)  BEHAVIORAL SYMPTOMS/MOOD NEUROLOGICAL BOWEL  NUTRITION STATUS      Continent Diet  AMBULATORY STATUS COMMUNICATION OF NEEDS Skin   Extensive Assist Verbally Normal                       Personal Care Assistance Level of Assistance  Bathing,Dressing,Feeding Bathing Assistance: Maximum assistance Feeding assistance: Independent Dressing Assistance: Maximum assistance     Functional Limitations Info  Sight,Hearing,Speech Sight Info: Adequate Hearing Info: Adequate Speech Info: Adequate    SPECIAL CARE FACTORS FREQUENCY  PT (By licensed PT),OT (By licensed OT)     PT Frequency: 5x a week OT Frequency: 5x a week            Contractures Contractures Info: Not present    Additional Factors Info  Code Status,Allergies Code Status Info: DNR Allergies Info: NKA           Current Medications (02/27/2021):  This is the current hospital active medication list Current Facility-Administered Medications  Medication Dose Route Frequency Provider Last Rate Last Admin  . acetaminophen (TYLENOL) tablet 650 mg  650 mg Oral Q6H PRN Clarene Essex, MD       Or  . acetaminophen (TYLENOL) suppository 650 mg  650 mg Rectal Q6H PRN Clarene Essex, MD      . ampicillin (OMNIPEN) 2 g in sodium chloride 0.9 % 100 mL IVPB  2 g Intravenous Q4H Carlyle Basques, MD 300 mL/hr at 02/27/21 1210 2 g at 02/27/21 1210  . diltiazem (CARDIZEM) tablet 30 mg  30 mg Oral Q6H Shela Leff, MD   30 mg at 02/27/21 1207  . haloperidol lactate (HALDOL) injection 1-2 mg  1-2 mg Intravenous  Q8H PRN Allie Bossier, MD      . insulin aspart (novoLOG) injection 0-5 Units  0-5 Units Subcutaneous QHS Vann, Jessica U, DO      . insulin aspart (novoLOG) injection 0-9 Units  0-9 Units Subcutaneous TID WC Vann, Jessica U, DO      . ipratropium-albuterol (DUONEB) 0.5-2.5 (3) MG/3ML nebulizer solution 3 mL  3 mL Nebulization Q6H PRN Allie Bossier, MD      . lactated ringers infusion   Intravenous Continuous Allie Bossier, MD 75 mL/hr at 02/26/21 1949 New Bag at  02/26/21 1949  . morphine 2 MG/ML injection 2-4 mg  2-4 mg Intravenous Q4H PRN Clarene Essex, MD      . ondansetron [facility]) tablet 4 mg  4 mg Oral Q6H PRN Clarene Essex, MD       Or  . ondansetron [facility]) injection 4 mg  4 mg Intravenous Q6H PRN Clarene Essex, MD   4 mg at 02/26/21 1733  . [START ON 02/28/2021] QUEtiapine (SEROQUEL) tablet 12.5 mg  12.5 mg Oral Daily Geradine Girt, DO         Discharge Medications: Please see discharge summary for a list of discharge medications.  Relevant Imaging Results:  Relevant Lab Results:   Additional Information SSN# [id number]  Reece Agar, [place]

## 2021-02-27 NOTE — TOC Initial Note (Addendum)
Transition of Care [facility]) - Initial/Assessment Note    Patient Details  Name: Hector Lucero MRN: [id number] Date of Birth: Oct 03, 1928  Transition of [facility]) CM/SW Contact:    Tresa Endo Phone Number: 02/27/2021, 3:26 PM  Clinical Narrative:                 3:20pm-CSW spoke with nurse at [facility] facility [phone] and updated her on the recommendations for pt. Nurse stated pt is already doing pt at the facility and can be DC back to facility after antibiotic is complete. CSW will follow up upon DC.  3:45pm- CSW spoke with CSW at [facility] [phone]) she stated that pt does need pt/ot faxed over prior to DC along with usual FL2 and DC summary. CSW will update social worker at facility as needed.          Patient Goals and CMS Choice        Expected Discharge Plan and Services                                                Prior Living Arrangements/Services                       Activities of Daily Living      Permission Sought/Granted                  Emotional Assessment              Admission diagnosis:  Ascending cholangitis [K83.09] Patient Active Problem List   Diagnosis Date Noted  . Ascending cholangitis 02/22/2021  . Choledocholithiasis with acute cholecystitis with obstruction 02/22/2021  . E coli bacteremia 02/22/2021  . PAF (paroxysmal atrial fibrillation) ([place]) 02/22/2021  . Acute gallstone pancreatitis 02/22/2021  . Abnormal radiologic findings on diagnostic imaging of renal pelvis, ureter, or bladder 02/22/2021  . Sepsis due to Escherichia coli (E. coli) ([place]) 02/22/2021  . Chest wall pain 03/12/2017  . Advance directive discussed with patient 01/02/2016  . SVT (supraventricular tachycardia) ([place]) 01/15/2015  . Vascular dementia without behavioral disturbance ([place]) 09/16/2014  . Cancer of prostate w/low recurrence risk (T1-2a, Gleason<7 & PSA<10) ([place]) 03/09/2014  .  Routine general medical examination at a health care facility 06/23/2013  . Diabetes mellitus type 2, diet-controlled ([place]) 12/24/2011  . Hyperlipidemia with target LDL less than 70 12/24/2011   PCP:  Venia Carbon, MD Pharmacy:   [id number] - [place], [place] [address] [place] [address] Phone: [phone] Fax: [phone]  [facility] [place], [place] [address] [place] [address] Phone: [phone] Fax: [phone]  [facility] [place], [place] [address] [place] [address] Phone: [phone] Fax: [phone]     Social Determinants of Health (SDOH) Interventions    Readmission Risk Interventions No flowsheet data found.

## 2021-02-28 LAB — CBC WITH DIFFERENTIAL/PLATELET
Abs Immature Granulocytes: 0.21 10*3/uL — ABNORMAL HIGH (ref 0.00–0.07)
Basophils Absolute: 0.1 10*3/uL (ref 0.0–0.1)
Basophils Relative: 1 %
Eosinophils Absolute: 0.5 10*3/uL (ref 0.0–0.5)
Eosinophils Relative: 6 %
HCT: 34.5 % — ABNORMAL LOW (ref 39.0–52.0)
Hemoglobin: 11.3 g/dL — ABNORMAL LOW (ref 13.0–17.0)
Immature Granulocytes: 3 %
Lymphocytes Relative: 24 %
Lymphs Abs: 1.8 10*3/uL (ref 0.7–4.0)
MCH: 29.4 pg (ref 26.0–34.0)
MCHC: 32.8 g/dL (ref 30.0–36.0)
MCV: 89.6 fL (ref 80.0–100.0)
Monocytes Absolute: 0.7 10*3/uL (ref 0.1–1.0)
Monocytes Relative: 10 %
Neutro Abs: 4.2 10*3/uL (ref 1.7–7.7)
Neutrophils Relative %: 56 %
Platelets: 243 10*3/uL (ref 150–400)
RBC: 3.85 MIL/uL — ABNORMAL LOW (ref 4.22–5.81)
RDW: 14.4 % (ref 11.5–15.5)
WBC: 7.4 10*3/uL (ref 4.0–10.5)
nRBC: 0 % (ref 0.0–0.2)

## 2021-02-28 LAB — MAGNESIUM: Magnesium: 2 mg/dL (ref 1.7–2.4)

## 2021-02-28 LAB — GLUCOSE, CAPILLARY
Glucose-Capillary: 97 mg/dL (ref 70–99)
Glucose-Capillary: 129 mg/dL — ABNORMAL HIGH (ref 70–99)
Glucose-Capillary: 185 mg/dL — ABNORMAL HIGH (ref 70–99)

## 2021-02-28 LAB — RESP PANEL BY RT-PCR (FLU A&B, COVID) ARPGX2
Influenza A by PCR: NEGATIVE
Influenza B by PCR: NEGATIVE
SARS Coronavirus 2 by RT PCR: NEGATIVE

## 2021-02-28 LAB — PHOSPHORUS: Phosphorus: 2.5 mg/dL (ref 2.5–4.6)

## 2021-02-28 MED ORDER — METOPROLOL TARTRATE 25 MG PO TABS: 12.5000 mg | ORAL_TABLET | Freq: Two times a day (BID) | ORAL | Status: AC

## 2021-02-28 MED ORDER — IPRATROPIUM-ALBUTEROL 0.5-2.5 (3) MG/3ML IN SOLN
3.0000 mL | Freq: Four times a day (QID) | RESPIRATORY_TRACT | Status: DC | PRN
Start: 2021-02-28 — End: 2024-04-07

## 2021-02-28 NOTE — Progress Notes (Signed)
Pt transported by [facility] back to [facility] with belongings. Family notified.

## 2021-02-28 NOTE — Discharge Summary (Signed)
Physician Discharge Summary  Hector Lucero [id number] DOB: [birth year] DOA: 02/22/2021  PCP: Venia Carbon, MD  Admit date: 02/22/2021 Discharge date: 02/28/2021  Admitted From: ALF Discharge disposition: SNF   Recommendations for Outpatient Follow-Up:   Dr. Perley Jain office will arrange follow-up KUB to ensure the temporary pancreatic duct stent passes as expected  SLP to follow and advance diet as able- diabetic DYS 3   Discharge Diagnosis:   Principal Problem:   Ascending cholangitis Active Problems:   Diabetes mellitus type 2, diet-controlled ([place])   Vascular dementia without behavioral disturbance ([place])   Choledocholithiasis with acute cholecystitis with obstruction   E coli bacteremia   Acute gallstone pancreatitis   Abnormal radiologic findings on diagnostic imaging of renal pelvis, ureter, or bladder   Sepsis due to Escherichia coli (E. coli) ([place])    Discharge Condition: Improved.  Diet recommendation:   Dysphagia 3 (Mech soft);Thin liquid - diabetic diet    Wound care: None.  Code status: DNR   History of Present Illness:   Hector Lucero is a 87 y.o. male with medical history significant of SVT, HTN, DM diet controlled.  Pt [facility] ED at [facility] with c/o abd pain.  Onset 2 days ago, progressively worsening, diffuse, associated nausea.  Has had vomiting and constipation.  Also chills and generalized weakness, mod difficulty walking due to weakness.   Hospital Course by Problem:   Choledocholithiasis with acute cholecystitis with obstruction/cholangitis -37mm CBD stone requiring ERCP - 4/16 s/p ERCP  - Continue current antibiotics for total of 7 days per ID recommendation - 4/17 GI signed off but recommended surgical consult for consideration of cholecystectomy - general surgery consult:  due to age and dementia, diabetes and other comorbidities, it is not as clear to do a preventive surgery. We discussed the details of surgery, risks  of surgery, and possibilities of recovery specific to [facility] including delirium, decreased activity especially if there was a surgical complication such as leak, infection, bleed, etc. [name] and [name] have chosen not to proceed with surgery. I think this is very reasonable.  In H&P there was a report of PAF-- I reviewed his chart throughly and can not find confirmed PAF-- he does have SVT and has been on cardizem in the past as well as metoprolol  Reported Sepsis - Upon admission patient does not meet sepsis criteria per Dr. Sherral Hammers  Bacteremia positive E. Coli - finished course antibiotics  Lactic acidosis -Resolved  Hypokalemia -repleted  Hypophosphatemia -repleted  Delirium/ Agitation  - resolved  Diabetes type 2 controlled without complication - [id number] hemoglobin A1c= 7.0   HLD -4/18 LDL= 92  Dysphagia? - DYS diet ordered by SLP - SLP to follow at SNF     Medical Consultants:   GI ID GS   Discharge Exam:   Vitals:   02/28/21 0800 02/28/21 1100  BP: 139/70 134/71  Pulse: 81 91  Resp: 17 16  Temp: 98.3 F (36.8 C) 97.9 F (36.6 C)  SpO2: 94% 94%   Vitals:   02/27/21 2306 02/28/21 0408 02/28/21 0800 02/28/21 1100  BP: 125/69 138/71 139/70 134/71  Pulse: 77 78 81 91  Resp: 20 20 17 16   Temp: 98.6 F (37 C) 98.4 F (36.9 C) 98.3 F (36.8 C) 97.9 F (36.6 C)  TempSrc: Oral Oral Oral Axillary  SpO2: 92% 92% 94% 94%  Weight:      Height:        General exam: Appears calm  and comfortable.    The results of significant diagnostics from this hospitalization (including imaging, microbiology, ancillary and laboratory) are listed below for reference.     Procedures and Diagnostic Studies:   DG ERCP  Result Date: 02/28/21 CLINICAL DATA:  Choledocholithiasis EXAM: ERCP TECHNIQUE: Multiple spot images obtained with the fluoroscopic device and submitted for interpretation post-procedure. FLUOROSCOPY TIME:  Fluoroscopy Time:  7 minutes 46  seconds Radiation Exposure Index (if provided by the fluoroscopic device): 34 mGy Number of Acquired Spot Images: 4 COMPARISON:  None. FINDINGS: Submitted intraoperative fluoroscopic images demonstrate cannulation and opacification of the common bile duct. The common bile duct is mildly dilated. A stent is noted adjacent to the common bile duct, possibly located within the pancreatic duct. IMPRESSION: Intraoperative fluoroscopic images of ERCP as above. These images were submitted for radiologic interpretation only. Please see the procedural report for the amount of contrast and the fluoroscopy time utilized. Electronically Signed   By: Miachel Roux M.D.   On: 02-28-21 13:54     Labs:   Basic Metabolic Panel: Recent Labs  Lab 02/23/21 0040 02/24/21 0044 02/25/21 0210 02/26/21 0110 02/27/21 0112 02/28/21 0143  NA 134* 136 137 136 138  --   K 3.6  3.6 3.6 3.6 3.4* 3.7  --   CL 104 109 107 106 110  --   CO2 23 23 24 24 25   --   GLUCOSE 146* 128* 111* 129* 122*  --   BUN 11 13 10 8 8   --   CREATININE 0.77 0.66 0.57* 0.59* 0.68  --   CALCIUM 8.1* 7.9* 8.0* 7.7* 7.6*  --   MG 2.0  2.0 2.1 1.9 1.9 1.9 2.0  PHOS 2.0* 1.2* 2.4* 3.4 3.0 2.5   GFR Estimated Creatinine Clearance: 58.2 mL/min (by C-G formula based on SCr of 0.68 mg/dL). Liver Function Tests: Recent Labs  Lab 02/23/21 0040 02/24/21 0044 02/25/21 0210 02/26/21 0110 02/27/21 0112  AST 47* 28 33 43* 31  ALT 81* 58* 53* 54* 48*  ALKPHOS 127* 108 106 105 107  BILITOT 5.5* 2.7* 1.9* 2.1* 1.6*  PROT 5.3* 5.0* 4.9* 4.6* 4.6*  ALBUMIN 2.5* 2.4* 2.3* 2.1* 2.0*   No results for input(s): LIPASE, AMYLASE in the last 168 hours. No results for input(s): AMMONIA in the last 168 hours. Coagulation profile No results for input(s): INR, PROTIME in the last 168 hours.  CBC: Recent Labs  Lab 02/24/21 0044 02/25/21 0210 02/26/21 0110 02/27/21 0112 02/28/21 0143  WBC 8.9 7.7 6.4 7.4 7.4  NEUTROABS 7.2 5.2 3.7 4.6 4.2  HGB  10.8* 11.6* 11.5* 11.5* 11.3*  HCT 32.4* 36.5* 35.1* 35.5* 34.5*  MCV 87.6 90.3 88.6 89.2 89.6  PLT 184 201 208 221 243   Cardiac Enzymes: No results for input(s): CKTOTAL, CKMB, CKMBINDEX, TROPONINI in the last 168 hours. BNP: Invalid input(s): POCBNP CBG: Recent Labs  Lab 02/27/21 1117 02/27/21 1615 02/27/21 2101 02/28/21 0604 02/28/21 1123  GLUCAP 208* 164* 124* 97 129*   D-Dimer No results for input(s): DDIMER in the last 72 hours. Hgb A1c No results for input(s): HGBA1C in the last 72 hours. Lipid Profile No results for input(s): CHOL, HDL, LDLCALC, TRIG, CHOLHDL, LDLDIRECT in the last 72 hours. Thyroid function studies No results for input(s): TSH, T4TOTAL, T3FREE, THYROIDAB in the last 72 hours.  Invalid input(s): FREET3 Anemia work up No results for input(s): VITAMINB12, FOLATE, FERRITIN, TIBC, IRON, RETICCTPCT in the last 72 hours. Microbiology Recent Results (from the past 240  hour(s))  Blood Culture (routine x 2)     Status: Abnormal   Collection Time: 02/21/21  1:51 PM   Specimen: BLOOD  Result Value Ref Range Status   Specimen Description   Final    BLOOD BLOOD LEFT WRIST Performed at [facility], [address], [place], [place] [address]    Special Requests   Final    BOTTLES DRAWN AEROBIC AND ANAEROBIC Blood Culture adequate volume Performed at [facility], [address] [place], [place] [address]    Culture  Setup Time   Final    GRAM NEGATIVE RODS IN BOTH AEROBIC AND ANAEROBIC BOTTLES CRITICAL VALUE NOTED.  VALUE IS CONSISTENT WITH PREVIOUSLY REPORTED AND CALLED VALUE. Performed at [facility], [address] [place], [place] [address]    Culture (A)  Final    ESCHERICHIA COLI SUSCEPTIBILITIES PERFORMED ON PREVIOUS CULTURE WITHIN THE LAST 5 DAYS. Performed at [facility], [place] [address] [place], [place] [address]    Report Status 02/24/2021 FINAL  Final  Resp Panel by RT-PCR (Flu A&B, Covid)  Nasopharyngeal Swab     Status: None   Collection Time: 02/21/21  2:10 PM   Specimen: Nasopharyngeal Swab; Nasopharyngeal(NP) swabs in vial transport medium  Result Value Ref Range Status   SARS Coronavirus 2 by RT PCR NEGATIVE NEGATIVE Final    Comment: (NOTE) SARS-CoV-2 target nucleic acids are NOT DETECTED.  The SARS-CoV-2 RNA is generally detectable in upper respiratory specimens during the acute phase of infection. The lowest concentration of SARS-CoV-2 viral copies this assay can detect is 138 copies/mL. A negative result does not preclude SARS-Cov-2 infection and should not be used as the sole basis for treatment or other patient management decisions. A negative result may occur with  improper specimen collection/handling, submission of specimen other than nasopharyngeal swab, presence of viral mutation(s) within the areas targeted by this assay, and inadequate number of viral copies(<138 copies/mL). A negative result must be combined with clinical observations, patient history, and epidemiological information. The expected result is Negative.  Fact Sheet for Patients:  EntrepreneurPulse.com.au  Fact Sheet for Healthcare Providers:  IncredibleEmployment.be  This test is no t yet approved or cleared by the [place] FDA and  has been authorized for detection and/or diagnosis of SARS-CoV-2 by FDA under an Emergency Use Authorization (EUA). This EUA will remain  in effect (meaning this test can be used) for the duration of the COVID-19 declaration under Section 564(b)(1) of the Act, 21 U.S.C.section 360bbb-3(b)(1), unless the authorization is terminated  or revoked sooner.       Influenza A by PCR NEGATIVE NEGATIVE Final   Influenza B by PCR NEGATIVE NEGATIVE Final    Comment: (NOTE) The Xpert Xpress SARS-CoV-2/FLU/RSV plus assay is intended as an aid in the diagnosis of influenza from Nasopharyngeal swab specimens and should not be  used as a sole basis for treatment. Nasal washings and aspirates are unacceptable for Xpert Xpress SARS-CoV-2/FLU/RSV testing.  Fact Sheet for Patients: EntrepreneurPulse.com.au  Fact Sheet for Healthcare Providers: IncredibleEmployment.be  This test is not yet approved or cleared by the [place] FDA and has been authorized for detection and/or diagnosis of SARS-CoV-2 by FDA under an Emergency Use Authorization (EUA). This EUA will remain in effect (meaning this test can be used) for the duration of the COVID-19 declaration under Section 564(b)(1) of the Act, 21 U.S.C. section 360bbb-3(b)(1), unless the authorization is terminated or revoked.  Performed at [facility], [address] [place], [place] [address]   Blood Culture (routine x 2)     Status: Abnormal   Collection Time: 02/21/21  2:10 PM   Specimen: BLOOD  Result Value Ref Range Status   Specimen Description   Final    BLOOD LEFT ANTECUBITAL Performed at [facility], [address], [place], [place] [address]    Special Requests   Final    BOTTLES DRAWN AEROBIC AND ANAEROBIC Blood Culture adequate volume Performed at [facility], [place], [place] [address]    Culture  Setup Time   Final    GRAM NEGATIVE RODS IN BOTH AEROBIC AND ANAEROBIC BOTTLES CRITICAL RESULT CALLED TO, READ BACK BY AND VERIFIED WITH: Winfield Rast PATEL ON 02/22/21 AT 0017 MF Performed at [facility], [place] [address] [place], [place] [address]    Culture ESCHERICHIA COLI (A)  Final   Report Status 02/24/2021 FINAL  Final   Organism ID, Bacteria ESCHERICHIA COLI  Final      Susceptibility   Escherichia coli - MIC*    AMPICILLIN <=2 SENSITIVE Sensitive     CEFAZOLIN <=4 SENSITIVE Sensitive     CEFEPIME <=0.12 SENSITIVE Sensitive     CEFTAZIDIME <=1 SENSITIVE Sensitive     CEFTRIAXONE <=0.25 SENSITIVE Sensitive     CIPROFLOXACIN <=0.25 SENSITIVE Sensitive      GENTAMICIN <=1 SENSITIVE Sensitive     IMIPENEM <=0.25 SENSITIVE Sensitive     TRIMETH/SULFA <=20 SENSITIVE Sensitive     AMPICILLIN/SULBACTAM <=2 SENSITIVE Sensitive     PIP/TAZO <=4 SENSITIVE Sensitive     * ESCHERICHIA COLI  Urine culture     Status: None   Collection Time: 02/21/21  2:10 PM   Specimen: In/Out Cath Urine  Result Value Ref Range Status   Specimen Description   Final    IN/OUT CATH URINE Performed at [facility], [address], [place], [place] [address]    Special Requests   Final    NONE Performed at [facility], [address], [place], [place] [address]    Culture   Final    NO GROWTH Performed at [facility], [place] [address], [place],  [address]    Report Status 02/23/2021 FINAL  Final  Blood Culture ID Panel (Reflexed)     Status: Abnormal   Collection Time: 02/21/21  2:10 PM  Result Value Ref Range Status   Enterococcus faecalis NOT DETECTED NOT DETECTED Final   Enterococcus Faecium NOT DETECTED NOT DETECTED Final   Listeria monocytogenes NOT DETECTED NOT DETECTED Final   Staphylococcus species NOT DETECTED NOT DETECTED Final   Staphylococcus aureus (BCID) NOT DETECTED NOT DETECTED Final   Staphylococcus epidermidis NOT DETECTED NOT DETECTED Final   Staphylococcus lugdunensis NOT DETECTED NOT DETECTED Final   Streptococcus species NOT DETECTED NOT DETECTED Final   Streptococcus agalactiae NOT DETECTED NOT DETECTED Final   Streptococcus pneumoniae NOT DETECTED NOT DETECTED Final   Streptococcus pyogenes NOT DETECTED NOT DETECTED Final   A.calcoaceticus-baumannii NOT DETECTED NOT DETECTED Final   Bacteroides fragilis NOT DETECTED NOT DETECTED Final   Enterobacterales DETECTED (A) NOT DETECTED Final    Comment: Enterobacterales represent a large order of gram negative bacteria, not a single organism. CRITICAL RESULT CALLED TO, READ BACK BY AND VERIFIED WITH: KISHAN LEPLAT AT 5/02/22/22 MF    Enterobacter  cloacae complex NOT DETECTED NOT DETECTED Final   Escherichia coli DETECTED (A) NOT DETECTED Final    Comment: CRITICAL RESULT  CALLED TO, READ BACK BY AND VERIFIED WITH: Winfield Rast LEPLAT AT 0016 02/22/21 MF    Klebsiella aerogenes NOT DETECTED NOT DETECTED Final   Klebsiella oxytoca NOT DETECTED NOT DETECTED Final   Klebsiella pneumoniae NOT DETECTED NOT DETECTED Final   Proteus species NOT DETECTED NOT DETECTED Final   Salmonella species NOT DETECTED NOT DETECTED Final   Serratia marcescens NOT DETECTED NOT DETECTED Final   Haemophilus influenzae NOT DETECTED NOT DETECTED Final   Neisseria meningitidis NOT DETECTED NOT DETECTED Final   Pseudomonas aeruginosa NOT DETECTED NOT DETECTED Final   Stenotrophomonas maltophilia NOT DETECTED NOT DETECTED Final   Candida albicans NOT DETECTED NOT DETECTED Final   Candida auris NOT DETECTED NOT DETECTED Final   Candida glabrata NOT DETECTED NOT DETECTED Final   Candida krusei NOT DETECTED NOT DETECTED Final   Candida parapsilosis NOT DETECTED NOT DETECTED Final   Candida tropicalis NOT DETECTED NOT DETECTED Final   Cryptococcus neoformans/gattii NOT DETECTED NOT DETECTED Final   CTX-M ESBL NOT DETECTED NOT DETECTED Final   Carbapenem resistance IMP NOT DETECTED NOT DETECTED Final   Carbapenem resistance KPC NOT DETECTED NOT DETECTED Final   Carbapenem resistance NDM NOT DETECTED NOT DETECTED Final   Carbapenem resist OXA 48 LIKE NOT DETECTED NOT DETECTED Final   Carbapenem resistance VIM NOT DETECTED NOT DETECTED Final    Comment: Performed at [facility], [address] [place], [place] [address]  MRSA PCR Screening     Status: None   Collection Time: 02/22/21  4:24 AM   Specimen: Nasopharyngeal  Result Value Ref Range Status   MRSA by PCR NEGATIVE NEGATIVE Final    Comment:        The GeneXpert MRSA Assay (FDA approved for NASAL specimens only), is one component of a comprehensive MRSA colonization surveillance program. It is  not intended to diagnose MRSA infection nor to guide or monitor treatment for MRSA infections. Performed at [facility], [place] [address] [place], [place] [address]   Resp Panel by RT-PCR (Flu A&B, Covid) Nasopharyngeal Swab     Status: None   Collection Time: 02/28/21  9:42 AM   Specimen: Nasopharyngeal Swab; Nasopharyngeal(NP) swabs in vial transport medium  Result Value Ref Range Status   SARS Coronavirus 2 by RT PCR NEGATIVE NEGATIVE Final    Comment: (NOTE) SARS-CoV-2 target nucleic acids are NOT DETECTED.  The SARS-CoV-2 RNA is generally detectable in upper respiratory specimens during the acute phase of infection. The lowest concentration of SARS-CoV-2 viral copies this assay can detect is 138 copies/mL. A negative result does not preclude SARS-Cov-2 infection and should not be used as the sole basis for treatment or other patient management decisions. A negative result may occur with  improper specimen collection/handling, submission of specimen other than nasopharyngeal swab, presence of viral mutation(s) within the areas targeted by this assay, and inadequate number of viral copies(<138 copies/mL). A negative result must be combined with clinical observations, patient history, and epidemiological information. The expected result is Negative.  Fact Sheet for Patients:  EntrepreneurPulse.com.au  Fact Sheet for Healthcare Providers:  IncredibleEmployment.be  This test is no t yet approved or cleared by the [place] FDA and  has been authorized for detection and/or diagnosis of SARS-CoV-2 by FDA under an Emergency Use Authorization (EUA). This EUA will remain  in effect (meaning this test can be used) for the duration of the COVID-19 declaration under Section 564(b)(1) of the Act, 21 U.S.C.section 360bbb-3(b)(1), unless the authorization is terminated  or revoked sooner.       Influenza A by PCR NEGATIVE NEGATIVE Final    Influenza B by PCR NEGATIVE NEGATIVE Final    Comment: (NOTE) The Xpert Xpress SARS-CoV-2/FLU/RSV plus assay is intended as an aid in the diagnosis of influenza from Nasopharyngeal swab specimens and should not be used as a sole basis for treatment. Nasal washings and aspirates are unacceptable for Xpert Xpress SARS-CoV-2/FLU/RSV testing.  Fact Sheet for Patients: EntrepreneurPulse.com.au  Fact Sheet for Healthcare Providers: IncredibleEmployment.be  This test is not yet approved or cleared by the [place] FDA and has been authorized for detection and/or diagnosis of SARS-CoV-2 by FDA under an Emergency Use Authorization (EUA). This EUA will remain in effect (meaning this test can be used) for the duration of the COVID-19 declaration under Section 564(b)(1) of the Act, 21 U.S.C. section 360bbb-3(b)(1), unless the authorization is terminated or revoked.  Performed at [facility], [place] [address] [place],  [address]      Discharge Instructions:   Discharge Instructions    Diet - low sodium heart healthy   Complete by: As directed    Discharge instructions   Complete by: As directed    Dr. Perley Jain office will arrange follow-up KUB to ensure the temporary pancreatic duct stent passes as expected   Increase activity slowly   Complete by: As directed      Allergies as of 02/28/2021   No Known Allergies     Medication List    TAKE these medications   acetaminophen 500 MG tablet Commonly known as: TYLENOL Take 1,000 mg by mouth every 4 (four) hours as needed for mild pain or fever.   Artificial Tears 1 % ophthalmic solution Generic drug: carboxymethylcellulose Apply 1 drop to eye 2 (two) times daily as needed (dry eyes).   ipratropium-albuterol 0.5-2.5 (3) MG/3ML Soln Commonly known as: DUONEB Take 3 mLs by nebulization every 6 (six) hours as needed.   MENS 50+ MULTI VITAMIN/MIN PO Take 1 tablet by mouth daily.    metoprolol tartrate 25 MG tablet Commonly known as: LOPRESSOR Take 0.5 tablets (12.5 mg total) by mouth 2 (two) times daily.   vitamin B-12 1000 MCG tablet Commonly known as: CYANOCOBALAMIN Take 1,000 mcg by mouth daily.         Time coordinating discharge: 35 min  Signed:  Geradine Girt DO  [facility] 02/28/2021, 2:24 PM

## 2021-02-28 NOTE — Plan of Care (Signed)
  Problem: Education: Goal: Knowledge of General Education information will improve Description: Including pain rating scale, medication(s)/side effects and non-pharmacologic comfort measures Outcome: Adequate for Discharge   Problem: Health Behavior/Discharge Planning: Goal: Ability to manage health-related needs will improve Outcome: Adequate for Discharge   Problem: Clinical Measurements: Goal: Ability to maintain clinical measurements within normal limits will improve Outcome: Adequate for Discharge Goal: Will remain free from infection Outcome: Adequate for Discharge Goal: Diagnostic test results will improve Outcome: Adequate for Discharge Goal: Respiratory complications will improve Outcome: Adequate for Discharge Goal: Cardiovascular complication will be avoided Outcome: Adequate for Discharge   Problem: Activity: Goal: Risk for activity intolerance will decrease Outcome: Adequate for Discharge   Problem: Nutrition: Goal: Adequate nutrition will be maintained Outcome: Adequate for Discharge   Problem: Coping: Goal: Level of anxiety will decrease Outcome: Adequate for Discharge   Problem: Elimination: Goal: Will not experience complications related to bowel motility Outcome: Adequate for Discharge Goal: Will not experience complications related to urinary retention Outcome: Adequate for Discharge   Problem: Pain Managment: Goal: General experience of comfort will improve Outcome: Adequate for Discharge   Problem: Safety: Goal: Ability to remain free from injury will improve Outcome: Adequate for Discharge   Problem: Skin Integrity: Goal: Risk for impaired skin integrity will decrease Outcome: Adequate for Discharge   Problem: Safety: Goal: Non-violent Restraint(s) Outcome: Adequate for Discharge   Problem: SLP Dysphagia Goals Goal: Misc Dysphagia Goal Outcome: Adequate for Discharge   Problem: Acute Rehab PT Goals(only PT should resolve) Goal: Pt  Will Go Supine/Side To Sit Outcome: Adequate for Discharge Goal: Patient Will Transfer Sit To/From Stand Outcome: Adequate for Discharge Goal: Pt Will Transfer Bed To Chair/Chair To Bed Outcome: Adequate for Discharge Goal: Pt Will Ambulate Outcome: Adequate for Discharge

## 2021-02-28 NOTE — Plan of Care (Signed)
  Problem: Clinical Measurements: °Goal: Ability to maintain clinical measurements within normal limits will improve °Outcome: Progressing °Goal: Will remain free from infection °Outcome: Progressing °Goal: Respiratory complications will improve °Outcome: Progressing °Goal: Cardiovascular complication will be avoided °Outcome: Progressing °  °Problem: Nutrition: °Goal: Adequate nutrition will be maintained °Outcome: Progressing °  °

## 2021-02-28 NOTE — Progress Notes (Signed)
SLP Cancellation Note  Patient Details Name: Hector Lucero MRN: [id number] DOB: 1928/02/03   Cancelled treatment:       Reason Eval/Treat Not Completed: Medical issues which prohibited therapy. Pt made NPO this morning. Per RN, this may be due to potential procedure today, so SLP will hold PO trials for now. Will f/u as able.     Osie Bond., M.A. [facility] Pager [phone] Office [phone]  02/28/2021, 9:11 AM

## 2021-02-28 NOTE — Progress Notes (Signed)
Call to Hector Lucero. Patient 's nephew that he will be returning to the facility today.  Attempted to call patient wife [name] but there was no answer.

## 2021-02-28 NOTE — Progress Notes (Signed)
Discussed with [name] about current disease and options of preventive cholecystectomy. Hector Lucero has a higher risk of complication from gallbladder disease in the future. However, due to age and dementia, diabetes and other comorbidities, it is not as clear to do a preventive surgery. We discussed the details of surgery, risks of surgery, and possibilities of recovery specific to [facility] including delirium, decreased activity especially if there was a surgical complication such as leak, infection, bleed, etc. [name] and [name] have chosen not to proceed with surgery. I think this is very reasonable.

## 2021-02-28 NOTE — TOC Transition Note (Addendum)
Transition of Care [facility]) - CM/SW Discharge Note   Patient Details  Name: Hector Lucero MRN: [id number] Date of Birth: 26-Mar-1928  Transition of Care [facility]) CM/SW Contact:  Tresa Endo Phone Number: 02/28/2021, 2:39 PM   Clinical Narrative:    Patient will DC to: [facility] Anticipated DC date: 02/28/2021 Family notified: Pt Nephew Transport by: Corey Harold   Per MD patient ready for DC to [facility] room 520. RN to call report prior to discharge [phone]). RN, patient, patient's family, and facility notified of DC. Discharge Summary and FL2 sent to facility. DC packet on chart. Ambulance transport requested for patient.   CSW will sign off for now as social work intervention is no longer needed. Please consult [place] again if new needs arise.            Patient Goals and CMS Choice        Discharge Placement                       Discharge Plan and Services                                     Social Determinants of Health (SDOH) Interventions     Readmission Risk Interventions No flowsheet data found.

## 2021-03-03 DIAGNOSIS — K8309 Other cholangitis: Secondary | ICD-10-CM

## 2021-03-03 DIAGNOSIS — E1159 Type 2 diabetes mellitus with other circulatory complications: Secondary | ICD-10-CM

## 2021-03-03 DIAGNOSIS — I471 Supraventricular tachycardia: Secondary | ICD-10-CM

## 2021-03-03 DIAGNOSIS — F0150 Vascular dementia without behavioral disturbance: Secondary | ICD-10-CM | POA: Diagnosis not present

## 2021-03-03 DIAGNOSIS — K8036 Calculus of bile duct with acute and chronic cholangitis without obstruction: Secondary | ICD-10-CM

## 2021-03-19 DIAGNOSIS — R509 Fever, unspecified: Secondary | ICD-10-CM | POA: Diagnosis not present

## 2021-04-03 ENCOUNTER — Encounter (HOSPITAL_COMMUNITY): Payer: Self-pay | Admitting: Gastroenterology

## 2021-04-30 DIAGNOSIS — E1159 Type 2 diabetes mellitus with other circulatory complications: Secondary | ICD-10-CM | POA: Diagnosis not present

## 2021-04-30 DIAGNOSIS — I471 Supraventricular tachycardia: Secondary | ICD-10-CM | POA: Diagnosis not present

## 2021-04-30 DIAGNOSIS — F0150 Vascular dementia without behavioral disturbance: Secondary | ICD-10-CM | POA: Diagnosis not present

## 2021-04-30 DIAGNOSIS — C61 Malignant neoplasm of prostate: Secondary | ICD-10-CM | POA: Diagnosis not present

## 2021-05-02 ENCOUNTER — Encounter: Payer: Medicare Other | Admitting: Internal Medicine

## 2021-07-07 DIAGNOSIS — I471 Supraventricular tachycardia: Secondary | ICD-10-CM | POA: Diagnosis not present

## 2021-07-07 DIAGNOSIS — E1159 Type 2 diabetes mellitus with other circulatory complications: Secondary | ICD-10-CM | POA: Diagnosis not present

## 2021-07-07 DIAGNOSIS — F0150 Vascular dementia without behavioral disturbance: Secondary | ICD-10-CM | POA: Diagnosis not present

## 2021-07-07 DIAGNOSIS — C61 Malignant neoplasm of prostate: Secondary | ICD-10-CM | POA: Diagnosis not present

## 2021-07-22 LAB — HM DIABETES EYE EXAM

## 2021-08-29 ENCOUNTER — Encounter: Payer: Self-pay | Admitting: Internal Medicine

## 2021-09-03 DIAGNOSIS — I471 Supraventricular tachycardia: Secondary | ICD-10-CM | POA: Diagnosis not present

## 2021-09-03 DIAGNOSIS — E1159 Type 2 diabetes mellitus with other circulatory complications: Secondary | ICD-10-CM | POA: Diagnosis not present

## 2021-09-03 DIAGNOSIS — C61 Malignant neoplasm of prostate: Secondary | ICD-10-CM | POA: Diagnosis not present

## 2021-09-12 DIAGNOSIS — M545 Low back pain, unspecified: Secondary | ICD-10-CM | POA: Diagnosis not present

## 2021-09-12 DIAGNOSIS — M25551 Pain in right hip: Secondary | ICD-10-CM | POA: Diagnosis not present

## 2021-09-12 DIAGNOSIS — Y92129 Unspecified place in nursing home as the place of occurrence of the external cause: Secondary | ICD-10-CM | POA: Diagnosis not present

## 2021-09-15 DIAGNOSIS — M25551 Pain in right hip: Secondary | ICD-10-CM | POA: Diagnosis not present

## 2021-09-15 DIAGNOSIS — S32019A Unspecified fracture of first lumbar vertebra, initial encounter for closed fracture: Secondary | ICD-10-CM | POA: Diagnosis not present

## 2021-10-23 DIAGNOSIS — I471 Supraventricular tachycardia: Secondary | ICD-10-CM | POA: Diagnosis not present

## 2021-10-23 DIAGNOSIS — E1159 Type 2 diabetes mellitus with other circulatory complications: Secondary | ICD-10-CM | POA: Diagnosis not present

## 2021-10-23 DIAGNOSIS — F0150 Vascular dementia without behavioral disturbance: Secondary | ICD-10-CM | POA: Diagnosis not present

## 2021-10-23 DIAGNOSIS — C61 Malignant neoplasm of prostate: Secondary | ICD-10-CM | POA: Diagnosis not present

## 2021-12-26 DIAGNOSIS — I471 Supraventricular tachycardia: Secondary | ICD-10-CM | POA: Diagnosis not present

## 2021-12-26 DIAGNOSIS — F0150 Vascular dementia without behavioral disturbance: Secondary | ICD-10-CM | POA: Diagnosis not present

## 2021-12-26 DIAGNOSIS — C61 Malignant neoplasm of prostate: Secondary | ICD-10-CM | POA: Diagnosis not present

## 2021-12-26 DIAGNOSIS — E1159 Type 2 diabetes mellitus with other circulatory complications: Secondary | ICD-10-CM | POA: Diagnosis not present

## 2022-02-17 DIAGNOSIS — L219 Seborrheic dermatitis, unspecified: Secondary | ICD-10-CM | POA: Diagnosis not present

## 2022-03-02 DIAGNOSIS — I471 Supraventricular tachycardia: Secondary | ICD-10-CM

## 2022-03-02 DIAGNOSIS — C61 Malignant neoplasm of prostate: Secondary | ICD-10-CM | POA: Diagnosis not present

## 2022-03-02 DIAGNOSIS — F0150 Vascular dementia without behavioral disturbance: Secondary | ICD-10-CM

## 2022-03-02 DIAGNOSIS — E1159 Type 2 diabetes mellitus with other circulatory complications: Secondary | ICD-10-CM

## 2022-03-09 LAB — PSA: PSA: 3.56

## 2022-03-09 LAB — HEMOGLOBIN A1C: Hemoglobin A1C: 6.5

## 2022-05-01 DIAGNOSIS — I471 Supraventricular tachycardia: Secondary | ICD-10-CM | POA: Diagnosis not present

## 2022-05-01 DIAGNOSIS — E1159 Type 2 diabetes mellitus with other circulatory complications: Secondary | ICD-10-CM | POA: Diagnosis not present

## 2022-05-01 DIAGNOSIS — F0150 Vascular dementia without behavioral disturbance: Secondary | ICD-10-CM | POA: Diagnosis not present

## 2022-05-01 DIAGNOSIS — C61 Malignant neoplasm of prostate: Secondary | ICD-10-CM | POA: Diagnosis not present

## 2022-07-06 DIAGNOSIS — F0150 Vascular dementia without behavioral disturbance: Secondary | ICD-10-CM | POA: Diagnosis not present

## 2022-07-06 DIAGNOSIS — I471 Supraventricular tachycardia: Secondary | ICD-10-CM | POA: Diagnosis not present

## 2022-07-06 DIAGNOSIS — E1159 Type 2 diabetes mellitus with other circulatory complications: Secondary | ICD-10-CM | POA: Diagnosis not present

## 2022-09-10 LAB — HEPATIC FUNCTION PANEL
ALT: 10 U/L (ref 10–40)
AST: 20 (ref 14–40)
Alkaline Phosphatase: 92 (ref 25–125)
Bilirubin, Total: 0.6

## 2022-09-10 LAB — BASIC METABOLIC PANEL
BUN: 11 (ref 4–21)
CO2: 27 — AB (ref 13–22)
Chloride: 104 (ref 99–108)
Creatinine: 0.7 (ref 0.6–1.3)
Glucose: 94
Potassium: 4.1 mEq/L (ref 3.5–5.1)
Sodium: 140 (ref 137–147)

## 2022-09-10 LAB — CBC AND DIFFERENTIAL
HCT: 39 — AB (ref 41–53)
Hemoglobin: 13.3 — AB (ref 13.5–17.5)
Platelets: 236 10*3/uL (ref 150–400)
WBC: 5.9

## 2022-09-10 LAB — COMPREHENSIVE METABOLIC PANEL
Albumin: 3.9 (ref 3.5–5.0)
Calcium: 9.2 (ref 8.7–10.7)
Globulin: 3

## 2022-09-10 LAB — HEMOGLOBIN A1C: Hemoglobin A1C: 6.9

## 2022-09-10 LAB — CBC: RBC: 4.57 (ref 3.87–5.11)

## 2022-09-15 ENCOUNTER — Encounter: Payer: Self-pay | Admitting: Nurse Practitioner

## 2022-09-15 ENCOUNTER — Non-Acute Institutional Stay (SKILLED_NURSING_FACILITY): Payer: Medicare Other | Admitting: Nurse Practitioner

## 2022-09-15 DIAGNOSIS — Z66 Do not resuscitate: Secondary | ICD-10-CM

## 2022-09-15 DIAGNOSIS — E119 Type 2 diabetes mellitus without complications: Secondary | ICD-10-CM

## 2022-09-15 DIAGNOSIS — I471 Supraventricular tachycardia, unspecified: Secondary | ICD-10-CM

## 2022-09-15 DIAGNOSIS — F0150 Vascular dementia without behavioral disturbance: Secondary | ICD-10-CM

## 2022-09-15 DIAGNOSIS — E785 Hyperlipidemia, unspecified: Secondary | ICD-10-CM

## 2022-09-15 NOTE — Progress Notes (Signed)
Location:  Other [facility]) Nursing Home Room Number: 809-X Place of Service:  SNF [phone]) Provider: Carlos American. Dewaine Oats, NP   Patient Care Team: Dewayne Shorter, MD as PCP - General (Family Medicine) Rockey Situ, Kathlene November, MD as Consulting Physician (Cardiology) Crecencio Mc, MD (Internal Medicine)  Extended Emergency Contact Information Primary Emergency Contact: Chiou,Janet Address: [address]          [place], [place] [address] Johnnette Litter of [place] Phone: [phone] Relation: Spouse Secondary Emergency Contact: [name] Mobile Phone: [phone] Relation: Nephew  Code Status:  DNR Goals of care: Advanced Directive information    09/15/2022    1:27 PM  Advanced Directives  Does Patient Have a Medical Advance Directive? Yes  Type of Paramedic of [place];Out of facility DNR (pink MOST or yellow form);Living will  Does patient want to make changes to medical advance directive? No - Patient declined  Copy of [place] in Chart? Yes - validated most recent copy scanned in chart (See row information)  Pre-existing out of facility DNR order (yellow form or pink MOST form) Yellow form placed in chart (order not valid for inpatient use)     Chief Complaint  Patient presents with   Medical Management of Chronic Issues    Routine visit and foot exam today.. Discuss need for covid boosters, shingrix, AWV, A1c, and eye exam or post pone if patient refuses. Vitals and medications are a reflection of [facility] system, [facility]     HPI:  Pt is a 88 y.o. male seen today for medical management of chronic diseases.  Pt with hx of SVT, DM, dementia. He is a long term [facility] of [facility]. He goes to memory care activities during the day. Still remains very independent with assistance of staff.  Staff and pt have no concerns.     Past Medical History:  Diagnosis Date   Atrial fibrillation  ([place])    Diabetes mellitus without complication ([place])    Fatigue    HLD (hyperlipidemia)    Hypertension    OSA (obstructive sleep apnea)    Radial fracture 2006   right, screws and plates   SVT (supraventricular tachycardia) 2012   a. Holter in 2012 showed 27 episodes of SVT/atrial tach w/ longest being 36 beats; b. asymptomatic   Syncope 08/2015   a. short rhythm strip in [facility] ED showed junctional escape rhythm and AV dissociation with essentially sinus bradycardia   Ulnar fracture 1995   fall off of ladder   Past Surgical History:  Procedure Laterality Date   ERCP Left 02/22/2021   Procedure: ENDOSCOPIC RETROGRADE CHOLANGIOPANCREATOGRAPHY (ERCP);  Surgeon: Clarene Essex, MD;  Location: [place];  Service: Endoscopy;  Laterality: Left;   HERNIA REPAIR  Aug 2012   right indirect inguinal, with mesh   PANCREATIC STENT PLACEMENT  02/22/2021   Procedure: PANCREATIC STENT PLACEMENT;  Surgeon: Clarene Essex, MD;  Location: [place];  Service: Endoscopy;;   REMOVAL OF STONES  02/22/2021   Procedure: REMOVAL OF STONES;  Surgeon: Clarene Essex, MD;  Location: [place];  Service: Endoscopy;;   SPHINCTEROTOMY  02/22/2021   Procedure: Joan Mayans;  Surgeon: Clarene Essex, MD;  Location: [facility] ENDOSCOPY;  Service: Endoscopy;;    No Known Allergies  Outpatient Encounter Medications as of 09/15/2022  Medication Sig   acetaminophen (TYLENOL) 500 MG tablet Take 1,000 mg by mouth every 4 (four) hours as needed for mild pain or fever.  carboxymethylcellulose (ARTIFICIAL TEARS) 1 % ophthalmic solution Apply 1 drop to eye every 12 (twelve) hours as needed (dry eyes). Or in the affected eye four times a day as needed   ipratropium-albuterol (DUONEB) 0.5-2.5 (3) MG/3ML SOLN Take 3 mLs by nebulization every 6 (six) hours as needed.   ketoconazole (NIZORAL) 2 % shampoo Apply 1 Application topically 2 (two) times a week. Tuesday and Friday   metoprolol tartrate (LOPRESSOR) 25 MG tablet Take 0.5 tablets  (12.5 mg total) by mouth 2 (two) times daily.   Multiple Vitamins-Minerals (MENS 50+ MULTI VITAMIN/MIN PO) Take 1 tablet by mouth daily.   Sodium Fluoride (PREVIDENT 5000 BOOSTER PLUS) 1.1 % PSTE Place 1 Application onto teeth daily.   vitamin B-12 (CYANOCOBALAMIN) 1000 MCG tablet Take 1,000 mcg by mouth daily.   No facility-administered encounter medications on file as of 09/15/2022.    Review of Systems  Constitutional:  Negative for activity change, appetite change, fatigue and unexpected weight change.  HENT:  Negative for congestion and hearing loss.   Eyes: Negative.   Respiratory:  Negative for cough and shortness of breath.   Cardiovascular:  Negative for chest pain, palpitations and leg swelling.  Gastrointestinal:  Negative for abdominal pain, constipation and diarrhea.  Genitourinary:  Negative for difficulty urinating and dysuria.  Musculoskeletal:  Negative for arthralgias and myalgias.  Skin:  Negative for color change and wound.  Neurological:  Negative for dizziness and weakness.  Psychiatric/Behavioral:  Negative for agitation, behavioral problems and confusion.     Immunization History  Administered Date(s) Administered   Influenza, High Dose Seasonal PF 08/25/2022   Influenza, Seasonal, Injecte, Preservative Fre 09/08/2015, 09/03/2016   Influenza-Unspecified 09/12/2013, 08/18/2014   PPD Test 04/29/2016   Pneumococcal Conjugate-13 01/23/2015   Pneumococcal Polysaccharide-23 01/20/2012, 06/22/2013   Tdap 08/21/2013   Zoster, Live 08/21/2013   Pertinent  Health Maintenance Due  Topic Date Due   FOOT EXAM  01/04/2018   OPHTHALMOLOGY EXAM  07/22/2022   HEMOGLOBIN A1C  03/11/2023   INFLUENZA VACCINE  Completed      02/26/2021    8:00 AM 02/26/2021    7:20 PM 02/27/2021    7:57 AM 02/27/2021    8:00 PM 02/28/2021    8:00 AM  Fall Risk  Patient Fall Risk Level High fall risk High fall risk High fall risk High fall risk High fall risk   Functional Status  Survey:    Vitals:   09/15/22 1318  BP: (!) 144/79  Pulse: 89  Weight: 159 lb (72.1 kg)  Height: '5\' 8"'$  (1.727 m)   Body mass index is 24.18 kg/m. Physical Exam  Labs reviewed: Recent Labs    09/10/22 0000  NA 140  K 4.1  CL 104  CO2 27*  BUN 11  CREATININE 0.7  CALCIUM 9.2   Recent Labs    09/10/22 0000  AST 20  ALT 10  ALKPHOS 92  ALBUMIN 3.9   Recent Labs    09/10/22 0000  WBC 5.9  HGB 13.3*  HCT 39*  PLT 236   Lab Results  Component Value Date   TSH 0.359 02/23/2021   Lab Results  Component Value Date   HGBA1C 6.9 09/10/2022   Lab Results  Component Value Date   CHOL 154 02/24/2021   HDL 44 02/24/2021   LDLCALC 92 02/24/2021   LDLDIRECT 124.0 01/04/2017   TRIG 91 02/24/2021   CHOLHDL 3.5 02/24/2021    Significant Diagnostic Results in last 30 days:  No results  found.  Assessment/Plan 1. Do not resuscitate - Do not attempt resuscitation (DNR)  2. SVT (supraventricular tachycardia) -well controlled on metoprolol.   3. Diabetes mellitus type 2, diet-controlled ([place]) -diabetes is well controlled on dietary modifications, A1c at goal  4. Hyperlipidemia with target LDL less than 70 -LDL stable.   5. Vascular dementia without behavioral disturbance (HCC) -Stable, no acute changes in cognitive or functional status, continue supportive care.   Carlos American. [place], [facility] [phone]

## 2022-09-16 ENCOUNTER — Encounter: Payer: Self-pay | Admitting: Student

## 2022-09-16 ENCOUNTER — Non-Acute Institutional Stay (SKILLED_NURSING_FACILITY): Payer: Medicare Other | Admitting: Student

## 2022-09-16 DIAGNOSIS — E119 Type 2 diabetes mellitus without complications: Secondary | ICD-10-CM

## 2022-09-16 DIAGNOSIS — I471 Supraventricular tachycardia, unspecified: Secondary | ICD-10-CM

## 2022-09-16 DIAGNOSIS — Z0000 Encounter for general adult medical examination without abnormal findings: Secondary | ICD-10-CM

## 2022-09-16 DIAGNOSIS — F0150 Vascular dementia without behavioral disturbance: Secondary | ICD-10-CM

## 2022-09-16 DIAGNOSIS — I480 Paroxysmal atrial fibrillation: Secondary | ICD-10-CM | POA: Diagnosis not present

## 2022-09-16 NOTE — Progress Notes (Signed)
Location:   [place] Room Number: 381-O Place of Service:  SNF [phone]) Provider:  Dr.Amauri Keefe    Patient Care Team: Hector Shorter, MD as PCP - General (Family Medicine) Hector Lucero, Kathlene November, MD as Consulting Physician (Cardiology) Hector Mc, MD (Internal Medicine)  Extended Emergency Contact Information Primary Emergency Contact: Hector Lucero,Hector Lucero Address: [address]          [place], [place] [address] Hector Lucero of [place] Phone: [phone] Relation: Spouse Secondary Emergency Contact: Hector Lucero Mobile Phone: [phone] Relation: Nephew  Code Status:  DNR Goals of care: Advanced Directive information    09/16/2022   10:55 AM  Advanced Directives  Does Patient Have a Medical Advance Directive? Yes  Type of Paramedic of [place];Living will;Out of facility DNR (pink MOST or yellow form)  Does patient want to make changes to medical advance directive? No - Patient declined  Copy of [place] in Chart? Yes - validated most recent copy scanned in chart (See row information)     Chief Complaint  Patient presents with   Medical Management of Chronic Issues    Routine Visit.   Health Maintenance    Discuss the need for AWV, Foot exam, and Eye exam.   Immunizations    Discuss the need for Covid vaccine, and Shingrix vaccine.    HPI:  Pt is a 88 y.o. male seen today for medical management of chronic diseases.   He says it's June in the 1960s.   He served in [place] in [employer]. He worked in [employer]. 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 He has children, but can't remember them. He knows his wife Hector Lucero, she lives here on the same floor. They have been married ~50 years per his report.   He says he is 44 and knows his birthday is February 11, 1928.   His favorite travel location was in [place]. He has been there several times. People were fantastic and  helpful.   ROS Negative  Appetite fine. No falls. BM good.    Past Medical History:  Diagnosis Date   Atrial fibrillation ([place])    Diabetes mellitus without complication ([place])    Fatigue    HLD (hyperlipidemia)    Hypertension    OSA (obstructive sleep apnea)    Radial fracture 2006   right, screws and plates   SVT (supraventricular tachycardia) 2012   a. Holter in 2012 showed 27 episodes of SVT/atrial tach w/ longest being 36 beats; b. asymptomatic   Syncope 08/2015   a. short rhythm strip in [facility] ED showed junctional escape rhythm and AV dissociation with essentially sinus bradycardia   Ulnar fracture 1995   fall off of ladder   Past Surgical History:  Procedure Laterality Date   ERCP Left 02/22/2021   Procedure: ENDOSCOPIC RETROGRADE CHOLANGIOPANCREATOGRAPHY (ERCP);  Surgeon: Hector Essex, MD;  Location: [place];  Service: Endoscopy;  Laterality: Left;   HERNIA REPAIR  Aug 2012   right indirect inguinal, with mesh   PANCREATIC STENT PLACEMENT  02/22/2021   Procedure: PANCREATIC STENT PLACEMENT;  Surgeon: Hector Essex, MD;  Location: [place];  Service: Endoscopy;;   REMOVAL OF STONES  02/22/2021   Procedure: REMOVAL OF STONES;  Surgeon: Hector Essex, MD;  Location: [facility] ENDOSCOPY;  Service: Endoscopy;;   SPHINCTEROTOMY  02/22/2021   Procedure: Hector Lucero;  Surgeon: Hector Essex, MD;  Location: [place];  Service: Endoscopy;;    No  Known Allergies  Allergies as of 09/16/2022   No Known Allergies      Medication List        Accurate as of September 16, 2022 10:56 AM. If you have any questions, ask your nurse or doctor.          acetaminophen 500 MG tablet Commonly known as: TYLENOL Take 1,000 mg by mouth every 4 (four) hours as needed for mild pain or fever.   Artificial Tears 1 % ophthalmic solution Generic drug: carboxymethylcellulose Apply 1 drop to eye every 12 (twelve) hours as needed (dry eyes). Or in the affected eye four times a day as  needed   cyanocobalamin 1000 MCG tablet Commonly known as: VITAMIN B12 Take 1,000 mcg by mouth daily.   ipratropium-albuterol 0.5-2.5 (3) MG/3ML Soln Commonly known as: DUONEB Take 3 mLs by nebulization every 6 (six) hours as needed.   ketoconazole 2 % shampoo Commonly known as: NIZORAL Apply 1 Application topically 2 (two) times a week. Tuesday and Friday   MENS 50+ MULTI VITAMIN/MIN PO Take 1 tablet by mouth daily.   metoprolol tartrate 25 MG tablet Commonly known as: LOPRESSOR Take 0.5 tablets (12.5 mg total) by mouth 2 (two) times daily.   PreviDent 5000 Booster Plus 1.1 % Pste Generic drug: Sodium Fluoride Place 1 Application onto teeth daily.   Tubersol 5 UNIT/0.1ML injection Generic drug: tuberculin Inject 5 Units into the skin as directed. Every 12 months.        Review of Systems  Unable to perform ROS: Dementia    Immunization History  Administered Date(s) Administered   Influenza, High Dose Seasonal PF 08/25/2022   Influenza, Seasonal, Injecte, Preservative Fre 09/08/2015, 09/03/2016   Influenza-Unspecified 09/12/2013, 08/18/2014   PPD Test 04/29/2016   Pneumococcal Conjugate-13 01/23/2015   Pneumococcal Polysaccharide-23 01/20/2012, 06/22/2013   Tdap 08/21/2013   Zoster, Live 08/21/2013   Pertinent  Health Maintenance Due  Topic Date Due   FOOT EXAM  01/04/2018   OPHTHALMOLOGY EXAM  07/22/2022   HEMOGLOBIN A1C  03/11/2023   INFLUENZA VACCINE  Completed      02/26/2021    8:00 AM 02/26/2021    7:20 PM 02/27/2021    7:57 AM 02/27/2021    8:00 PM 02/28/2021    8:00 AM  Fall Risk  Patient Fall Risk Level High fall risk High fall risk High fall risk High fall risk High fall risk   Functional Status Survey:    Vitals:   09/16/22 1051  BP: (!) 144/79  Pulse: 89  Resp: 20  Temp: (!) 97.1 F (36.2 C)  SpO2: 98%  Weight: 156 lb 3.2 oz (70.9 kg)  Height: '5\' 8"'$  (1.727 m)   Body mass index is 23.75 kg/m. Physical Exam Constitutional:       Appearance: Normal appearance.  Cardiovascular:     Rate and Rhythm: Normal rate and regular rhythm.     Pulses: Normal pulses.     Heart sounds: Normal heart sounds.  Pulmonary:     Effort: Pulmonary effort is normal.     Breath sounds: Normal breath sounds.  Abdominal:     General: Abdomen is flat. Bowel sounds are normal.     Palpations: Abdomen is soft.  Skin:    General: Skin is warm and dry.  Neurological:     Mental Status: He is alert. Mental status is at baseline. He is disoriented.     Comments: Walks with rollator      Labs reviewed: Recent Labs  09/10/22 0000  NA 140  K 4.1  CL 104  CO2 27*  BUN 11  CREATININE 0.7  CALCIUM 9.2   Recent Labs    09/10/22 0000  AST 20  ALT 10  ALKPHOS 92  ALBUMIN 3.9   Recent Labs    09/10/22 0000  WBC 5.9  HGB 13.3*  HCT 39*  PLT 236   Lab Results  Component Value Date   TSH 0.359 02/23/2021   Lab Results  Component Value Date   HGBA1C 6.9 09/10/2022   Lab Results  Component Value Date   CHOL 154 02/24/2021   HDL 44 02/24/2021   LDLCALC 92 02/24/2021   LDLDIRECT 124.0 01/04/2017   TRIG 91 02/24/2021   CHOLHDL 3.5 02/24/2021    Significant Diagnostic Results in last 30 days:  No results found.  Assessment/Plan 1. Vascular dementia without behavioral disturbance [facility]) Patient is oriented to self and location, but not to time or situation. Weight is stable. No recent falls. Independent in ambulation. Requires support for medications. No behaviors. Continues activities in memory care. No medications.   2. SVT (supraventricular tachycardia) 3. PAF (paroxysmal atrial fibrillation) (HCC) HR well-controlled on metoprolol 12.'5mg'$  BID. No AC.   4. Diabetes mellitus type 2, diet-controlled ([place]) Most recent A1c 6.9 09/10/2022. Will continue with q85molabs  5. Routine general medical examination at a health care facility [facility] He is active in the memory care daily  activities.     Family/ staff Communication: Nursing updated.   Labs/tests ordered:  none  VTomasa Rand MD, MThe [facility]oscopy [facility] LLCPEssentia Health [facility] [phone]

## 2022-11-26 ENCOUNTER — Encounter: Payer: Self-pay | Admitting: Nurse Practitioner

## 2022-11-26 NOTE — Progress Notes (Deleted)
Location:  Other [facility]) Nursing Home Room Number: [place] of Service:  SNF (31)  Kyliee Ortego K. Dewaine Oats, NP   Patient Care Team: Dewayne Shorter, MD as PCP - General (Family Medicine) Rockey Situ, Kathlene November, MD as Consulting Physician (Cardiology) Crecencio Mc, MD (Internal Medicine)  Extended Emergency Contact Information Primary Emergency Contact: Tienda,Janet Address: [address]          [place], [place] [address] Johnnette Litter of [place] Phone: [phone] Relation: Spouse Secondary Emergency Contact: [name] Mobile Phone: [phone] Relation: Nephew  Goals of care: Advanced Directive information    11/26/2022   10:30 AM  Advanced Directives  Does Patient Have a Medical Advance Directive? Yes  Type of Paramedic of [place];Living will;Out of facility DNR (pink MOST or yellow form)  Does patient want to make changes to medical advance directive? No - Patient declined  Copy of [place] in Chart? Yes - validated most recent copy scanned in chart (See row information)  Pre-existing out of facility DNR order (yellow form or pink MOST form) Yellow form placed in chart (order not valid for inpatient use)     Chief Complaint  Patient presents with   Medical Management of Chronic Issues    Routine visit and foot exam today. Discuss need for covid boosters, shingrix, AWV, and eye exam or post pone if patient refuses or is not a candidate. Vitals and medications are a reflection of [facility]MR system, [facility]      HPI:  Pt is a 89 y.o. male seen today for medical management of chronic disease.    Past Medical History:  Diagnosis Date   Acute gallstone pancreatitis 02/22/2021   Ascending cholangitis 02/22/2021   Atrial fibrillation ([place])    Choledocholithiasis with acute cholecystitis with obstruction 02/22/2021   Diabetes mellitus without complication ([place])    E coli bacteremia 02/22/2021    Fatigue    HLD (hyperlipidemia)    Hypertension    OSA (obstructive sleep apnea)    Radial fracture 2006   right, screws and plates   Sepsis due to Escherichia coli (E. coli) ([place]) 02/22/2021   SVT (supraventricular tachycardia) 2012   a. Holter in 2012 showed 27 episodes of SVT/atrial tach w/ longest being 36 beats; b. asymptomatic   Syncope 08/2015   a. short rhythm strip in [facility] ED showed junctional escape rhythm and AV dissociation with essentially sinus bradycardia   Ulnar fracture 1995   fall off of ladder   Past Surgical History:  Procedure Laterality Date   ERCP Left 02/22/2021   Procedure: ENDOSCOPIC RETROGRADE CHOLANGIOPANCREATOGRAPHY (ERCP);  Surgeon: Clarene Essex, MD;  Location: [place];  Service: Endoscopy;  Laterality: Left;   HERNIA REPAIR  Aug 2012   right indirect inguinal, with mesh   PANCREATIC STENT PLACEMENT  02/22/2021   Procedure: PANCREATIC STENT PLACEMENT;  Surgeon: Clarene Essex, MD;  Location: [place];  Service: Endoscopy;;   REMOVAL OF STONES  02/22/2021   Procedure: REMOVAL OF STONES;  Surgeon: Clarene Essex, MD;  Location: [place];  Service: Endoscopy;;   SPHINCTEROTOMY  02/22/2021   Procedure: Joan Mayans;  Surgeon: Clarene Essex, MD;  Location: [facility] ENDOSCOPY;  Service: Endoscopy;;    No Known Allergies  Outpatient Encounter Medications as of 11/26/2022  Medication Sig   acetaminophen (TYLENOL) 500 MG tablet Take 1,000 mg by mouth every 4 (four) hours as needed for mild pain or fever.   carboxymethylcellulose (ARTIFICIAL TEARS) 1 %  ophthalmic solution Apply 1 drop to eye every 12 (twelve) hours as needed (dry eyes). Or in the affected eye four times a day as needed   ipratropium-albuterol (DUONEB) 0.5-2.5 (3) MG/3ML SOLN Take 3 mLs by nebulization every 6 (six) hours as needed.   ketoconazole (NIZORAL) 2 % shampoo Apply 1 Application topically 2 (two) times a week. Tuesday and Friday   metoprolol tartrate (LOPRESSOR) 25 MG tablet Take 0.5  tablets (12.5 mg total) by mouth 2 (two) times daily.   Multiple Vitamins-Minerals (MENS 50+ MULTI VITAMIN/MIN PO) Take 1 tablet by mouth daily.   Sodium Fluoride (PREVIDENT 5000 BOOSTER PLUS) 1.1 % PSTE Place 1 Application onto teeth daily.   tuberculin (TUBERSOL) 5 UNIT/0.1ML injection Inject 5 Units into the skin as directed. Every 12 months.   vitamin B-12 (CYANOCOBALAMIN) 1000 MCG tablet Take 1,000 mcg by mouth daily.   No facility-administered encounter medications on file as of 11/26/2022.    Review of Systems ***  Immunization History  Administered Date(s) Administered   Influenza, High Dose Seasonal PF 08/25/2022   Influenza, Seasonal, Injecte, Preservative Fre 09/08/2015, 09/03/2016   Influenza-Unspecified 09/12/2013, 08/18/2014   Moderna Covid-19 Vaccine Bivalent Booster 60yr & up 08/01/2021, 04/07/2022   Moderna SARS-COV2 Booster Vaccination 09/20/2020, 03/28/2021   Moderna Sars-Covid-2 Vaccination 11/20/2019, 12/18/2019   PPD Test 04/29/2016   Pneumococcal Conjugate-13 01/23/2015   Pneumococcal Polysaccharide-23 01/20/2012, 06/22/2013   Tdap 08/21/2013   Zoster, Live 08/21/2013   Pertinent  Health Maintenance Due  Topic Date Due   FOOT EXAM  01/04/2018   OPHTHALMOLOGY EXAM  07/22/2022   HEMOGLOBIN A1C  03/11/2023   INFLUENZA VACCINE  Completed      02/26/2021    8:00 AM 02/26/2021    7:20 PM 02/27/2021    7:57 AM 02/27/2021    8:00 PM 02/28/2021    8:00 AM  Fall Risk  (RETIRED) Patient Fall Risk Level High fall risk High fall risk High fall risk High fall risk High fall risk   Functional Status Survey:    Vitals:   11/26/22 1024  BP: (!) 146/76  Pulse: 73  Weight: 155 lb 9.6 oz (70.6 kg)  Height: '5\' 8"'$  (1.727 m)   Body mass index is 23.66 kg/m. Physical Exam***  Labs reviewed: Recent Labs    09/10/22 0000  NA 140  K 4.1  CL 104  CO2 27*  BUN 11  CREATININE 0.7  CALCIUM 9.2   Recent Labs    09/10/22 0000  AST 20  ALT 10  ALKPHOS 92   ALBUMIN 3.9   Recent Labs    09/10/22 0000  WBC 5.9  HGB 13.3*  HCT 39*  PLT 236   Lab Results  Component Value Date   TSH 0.359 02/23/2021   Lab Results  Component Value Date   HGBA1C 6.9 09/10/2022   Lab Results  Component Value Date   CHOL 154 02/24/2021   HDL 44 02/24/2021   LDLCALC 92 02/24/2021   LDLDIRECT 124.0 01/04/2017   TRIG 91 02/24/2021   CHOLHDL 3.5 02/24/2021    Significant Diagnostic Results in last 30 days:  No results found.  Assessment/Plan There are no diagnoses linked to this encounter.   JCarlos American EDulles Town Center [facility]ult Medicine [phone]

## 2022-11-27 NOTE — Progress Notes (Signed)
This encounter was created in error - please disregard.

## 2022-12-08 ENCOUNTER — Encounter: Payer: Self-pay | Admitting: Nurse Practitioner

## 2022-12-08 ENCOUNTER — Non-Acute Institutional Stay (SKILLED_NURSING_FACILITY): Payer: Medicare Other | Admitting: Nurse Practitioner

## 2022-12-08 DIAGNOSIS — E119 Type 2 diabetes mellitus without complications: Secondary | ICD-10-CM | POA: Diagnosis not present

## 2022-12-08 DIAGNOSIS — I471 Supraventricular tachycardia, unspecified: Secondary | ICD-10-CM

## 2022-12-08 DIAGNOSIS — I480 Paroxysmal atrial fibrillation: Secondary | ICD-10-CM

## 2022-12-08 DIAGNOSIS — C61 Malignant neoplasm of prostate: Secondary | ICD-10-CM

## 2022-12-08 DIAGNOSIS — F0150 Vascular dementia without behavioral disturbance: Secondary | ICD-10-CM | POA: Diagnosis not present

## 2022-12-08 NOTE — Progress Notes (Unsigned)
Location:  Other Nursing Home Room Number: [place] of Service:  SNF (31)  Dewayne Shorter, MD  Patient Care Team: Dewayne Shorter, MD as PCP - General (Family Medicine) Rockey Situ, Kathlene November, MD as Consulting Physician (Cardiology) Crecencio Mc, MD (Internal Medicine)  Extended Emergency Contact Information Primary Emergency Contact: Wheeling,Janet Address: [address]          [place], [place] [address] Johnnette Litter of [place] Phone: [phone] Relation: Spouse Secondary Emergency Contact: [name] Mobile Phone: [phone] Relation: Nephew  Goals of care: Advanced Directive information    12/08/2022    4:18 PM  Advanced Directives  Does Patient Have a Medical Advance Directive? Yes  Type of Paramedic of [place];Living will;Out of facility DNR (pink MOST or yellow form)  Does patient want to make changes to medical advance directive? No - Patient declined  Copy of [place] in Chart? Yes - validated most recent copy scanned in chart (See row information)  Pre-existing out of facility DNR order (yellow form or pink MOST form) Yellow form placed in chart (order not valid for inpatient use)     Chief Complaint  Patient presents with   Medical Management of Chronic Issues    Routine follow up   Immunizations    Shingrix vaccine and current COVID booster due   Quality Metric Gaps    Foot exam, Medicare annual wellness due and eye exam due.    HPI:  Pt is a 89 y.o. male seen today for medical management of chronic disease. Pt is long term resident at [facility], goes to [facility] [employer] [facility] and enjoys this.  He denies any pain or complaints.  Gets around with the walker.  Eating well- weight has been stable.  Nursing without concerns at this time.    Past Medical History:  Diagnosis Date   Acute gallstone pancreatitis 02/22/2021   Ascending cholangitis 02/22/2021   Atrial fibrillation ([place])     Choledocholithiasis with acute cholecystitis with obstruction 02/22/2021   Diabetes mellitus without complication ([place])    E coli bacteremia 02/22/2021   Fatigue    HLD (hyperlipidemia)    Hypertension    OSA (obstructive sleep apnea)    Radial fracture 2006   right, screws and plates   Sepsis due to Escherichia coli (E. coli) ([place]) 02/22/2021   SVT (supraventricular tachycardia) 2012   a. Holter in 2012 showed 27 episodes of SVT/atrial tach w/ longest being 36 beats; b. asymptomatic   Syncope 08/2015   a. short rhythm strip in [facility] ED showed junctional escape rhythm and AV dissociation with essentially sinus bradycardia   Ulnar fracture 1995   fall off of ladder   Past Surgical History:  Procedure Laterality Date   ERCP Left 02/22/2021   Procedure: ENDOSCOPIC RETROGRADE CHOLANGIOPANCREATOGRAPHY (ERCP);  Surgeon: Clarene Essex, MD;  Location: [facility];  Service: Endoscopy;  Laterality: Left;   HERNIA REPAIR  Aug 2012   right indirect inguinal, with mesh   PANCREATIC STENT PLACEMENT  02/22/2021   Procedure: PANCREATIC STENT PLACEMENT;  Surgeon: Clarene Essex, MD;  Location: [facility];  Service: Endoscopy;;   REMOVAL OF STONES  02/22/2021   Procedure: REMOVAL OF STONES;  Surgeon: Clarene Essex, MD;  Location: [facility] ENDOSCOPY;  Service: Endoscopy;;   SPHINCTEROTOMY  02/22/2021   Procedure: Joan Mayans;  Surgeon: Clarene Essex, MD;  Location: [facility] ENDOSCOPY;  Service: Endoscopy;;    No Known Allergies  Outpatient Encounter Medications as of  12/08/2022  Medication Sig   acetaminophen (TYLENOL) 500 MG tablet Take 1,000 mg by mouth every 4 (four) hours as needed for mild pain or fever.   carboxymethylcellulose (ARTIFICIAL TEARS) 1 % ophthalmic solution Apply 1 drop to eye every 12 (twelve) hours as needed (dry eyes). Or in the affected eye four times a day as needed   ipratropium-albuterol (DUONEB) 0.5-2.5 (3) MG/3ML SOLN Take 3 mLs by nebulization every 6 (six) hours as needed.   ketoconazole  (NIZORAL) 2 % shampoo Apply 1 Application topically 2 (two) times a week. Tuesday and Friday   metoprolol tartrate (LOPRESSOR) 25 MG tablet Take 0.5 tablets (12.5 mg total) by mouth 2 (two) times daily.   Multiple Vitamins-Minerals (MENS 50+ MULTI VITAMIN/MIN PO) Take 1 tablet by mouth daily.   Sodium Fluoride (PREVIDENT 5000 BOOSTER PLUS) 1.1 % PSTE Place 1 Application onto teeth daily.   vitamin B-12 (CYANOCOBALAMIN) 1000 MCG tablet Take 1,000 mcg by mouth daily.   tuberculin (TUBERSOL) 5 UNIT/0.1ML injection Inject 5 Units into the skin as directed. Every 12 months.   No facility-administered encounter medications on file as of 12/08/2022.    Review of Systems  Constitutional:  Negative for activity change, appetite change, fatigue and unexpected weight change.  HENT:  Negative for congestion and hearing loss.   Eyes: Negative.   Respiratory:  Negative for cough and shortness of breath.   Cardiovascular:  Negative for chest pain, palpitations and leg swelling.  Gastrointestinal:  Negative for abdominal pain, constipation and diarrhea.  Genitourinary:  Negative for difficulty urinating and dysuria.  Musculoskeletal:  Negative for arthralgias and myalgias.  Skin:  Negative for color change and wound.  Neurological:  Negative for dizziness and weakness.  Psychiatric/Behavioral:  Positive for confusion (dementia). Negative for agitation and behavioral problems.      Immunization History  Administered Date(s) Administered   Influenza, High Dose Seasonal PF 08/25/2022   Influenza, Seasonal, Injecte, Preservative Fre 09/08/2015, 09/03/2016   Influenza-Unspecified 09/12/2013, 08/18/2014   Moderna Covid-19 Vaccine Bivalent Booster 70yr & up 08/01/2021, 04/07/2022   Moderna SARS-COV2 Booster Vaccination 09/20/2020, 03/28/2021   Moderna Sars-Covid-2 Vaccination 11/20/2019, 12/18/2019   PPD Test 04/29/2016   Pneumococcal Conjugate-13 01/23/2015   Pneumococcal Polysaccharide-23 01/20/2012,  06/22/2013   Tdap 08/21/2013   Zoster, Live 08/21/2013   Pertinent  Health Maintenance Due  Topic Date Due   FOOT EXAM  01/04/2018   OPHTHALMOLOGY EXAM  07/22/2022   HEMOGLOBIN A1C  03/11/2023   INFLUENZA VACCINE  Completed      02/26/2021    8:00 AM 02/26/2021    7:20 PM 02/27/2021    7:57 AM 02/27/2021    8:00 PM 02/28/2021    8:00 AM  Fall Risk  (RETIRED) Patient Fall Risk Level High fall risk High fall risk High fall risk High fall risk High fall risk   Functional Status Survey:    Vitals:   12/08/22 1615  BP: (!) 146/74  Pulse: 79  Resp: 20  Temp: (!) 97.1 F (36.2 C)  SpO2: 97%  Weight: 155 lb 9.6 oz (70.6 kg)  Height: '5\' 8"'$  (1.727 m)   Body mass index is 23.66 kg/m. Physical Exam Constitutional:      General: He is not in acute distress.    Appearance: He is well-developed. He is not diaphoretic.  HENT:     Head: Normocephalic and atraumatic.     Right Ear: External ear normal.     Left Ear: External ear normal.  Mouth/Throat:     Pharynx: No oropharyngeal exudate.  Eyes:     Conjunctiva/sclera: Conjunctivae normal.     Pupils: Pupils are equal, round, and reactive to light.  Cardiovascular:     Rate and Rhythm: Normal rate and regular rhythm.     Heart sounds: Normal heart sounds.  Pulmonary:     Effort: Pulmonary effort is normal.     Breath sounds: Normal breath sounds.  Abdominal:     General: Bowel sounds are normal.     Palpations: Abdomen is soft.  Musculoskeletal:        General: No tenderness.     Cervical back: Normal range of motion and neck supple.     Right lower leg: No edema.     Left lower leg: No edema.  Skin:    General: Skin is warm and dry.  Neurological:     Mental Status: He is alert and oriented to person, place, and time. Mental status is at baseline.     Gait: Gait abnormal (slow, uses walker).     Labs reviewed: Recent Labs    09/10/22 0000  NA 140  K 4.1  CL 104  CO2 27*  BUN 11  CREATININE 0.7  CALCIUM  9.2   Recent Labs    09/10/22 0000  AST 20  ALT 10  ALKPHOS 92  ALBUMIN 3.9   Recent Labs    09/10/22 0000  WBC 5.9  HGB 13.3*  HCT 39*  PLT 236   Lab Results  Component Value Date   TSH 0.359 02/23/2021   Lab Results  Component Value Date   HGBA1C 6.9 09/10/2022   Lab Results  Component Value Date   CHOL 154 02/24/2021   HDL 44 02/24/2021   LDLCALC 92 02/24/2021   LDLDIRECT 124.0 01/04/2017   TRIG 91 02/24/2021   CHOLHDL 3.5 02/24/2021    Significant Diagnostic Results in last 30 days:  No results found.  Assessment/Plan 1. PAF (paroxysmal atrial fibrillation) (HCC) -rate controlled on metoprolol, not on any anticoagulation.   2. Vascular dementia without behavioral disturbance (HCC) -Stable, no acute changes in cognitive or functional status, continue supportive care with staff at [facility] and family  3. Diabetes mellitus type 2, diet-controlled ([place]) -A1c controlled with diet, continues with routine foot care/monitoring and to keep up with diabetic eye exams through ophthalmology   4. Cancer of prostate w/low recurrence risk (T1-2a, Gleason<7 & PSA<10) (HCC) -no further action at this time due to age.   5. SVT (supraventricular tachycardia) -well controlled on metoprolol.    Carlos American. [place], [facility] [phone]

## 2023-01-26 ENCOUNTER — Non-Acute Institutional Stay (SKILLED_NURSING_FACILITY): Payer: Medicare Other | Admitting: Nurse Practitioner

## 2023-01-26 ENCOUNTER — Encounter: Payer: Self-pay | Admitting: Nurse Practitioner

## 2023-01-26 DIAGNOSIS — C61 Malignant neoplasm of prostate: Secondary | ICD-10-CM | POA: Diagnosis not present

## 2023-01-26 DIAGNOSIS — F0150 Vascular dementia without behavioral disturbance: Secondary | ICD-10-CM

## 2023-01-26 DIAGNOSIS — I471 Supraventricular tachycardia, unspecified: Secondary | ICD-10-CM | POA: Diagnosis not present

## 2023-01-26 DIAGNOSIS — E119 Type 2 diabetes mellitus without complications: Secondary | ICD-10-CM | POA: Diagnosis not present

## 2023-01-26 NOTE — Progress Notes (Signed)
Location:  [facility].  Nursing Home Room Number: [place]:  SNF [phone]) Provider:  Sherrie Mustache, NP  PCP: Dewayne Shorter, MD  Patient Care Team: Dewayne Shorter, MD as PCP - General (Family Medicine) Rockey Situ, Kathlene November, MD as Consulting Physician (Cardiology) Crecencio Mc, MD (Internal Medicine)  Extended Emergency Contact Information Primary Emergency Contact: Ohlendorf,Janet Address: [address]          [place],  [address] Johnnette Litter of [place] Phone: [phone] Relation: Spouse Secondary Emergency Contact: [name] Mobile Phone: [phone] Relation: Nephew  Code Status:  DNR Goals of care: Advanced Directive information    01/26/2023    2:07 PM  Advanced Directives  Does Patient Have a Medical Advance Directive? Yes  Type of Paramedic of [place];Living will;Out of facility DNR (pink MOST or yellow form)  Does patient want to make changes to medical advance directive? No - Patient declined  Copy of [place] in Chart? Yes - validated most recent copy scanned in chart (See row information)     Chief Complaint  Patient presents with   Medical Management of Chronic Issues    Medical Management of Chronic Issues.     HPI:  Pt is a 89 y.o. male seen today for medical management of chronic diseases.  Pt with hx of dementia, a fib, DM, htn, SVT.  He has been doing well without complaints.  Staff does not have any concerns at this time.  [place] and enjoys doing this.     Past Medical History:  Diagnosis Date   Acute gallstone pancreatitis 02/22/2021   Ascending cholangitis 02/22/2021   Atrial fibrillation ([place])    Choledocholithiasis with acute cholecystitis with obstruction 02/22/2021   Diabetes mellitus without complication ([place])    E coli bacteremia 02/22/2021   Fatigue    HLD (hyperlipidemia)    Hypertension    OSA  (obstructive sleep apnea)    Radial fracture 2006   right, screws and plates   Sepsis due to Escherichia coli (E. coli) ([place]) 02/22/2021   SVT (supraventricular tachycardia) 2012   a. Holter in 2012 showed 27 episodes of SVT/atrial tach w/ longest being 36 beats; b. asymptomatic   Syncope 08/2015   a. short rhythm strip in [facility] ED showed junctional escape rhythm and AV dissociation with essentially sinus bradycardia   Ulnar fracture 1995   fall off of ladder   Past Surgical History:  Procedure Laterality Date   ERCP Left 02/22/2021   Procedure: ENDOSCOPIC RETROGRADE CHOLANGIOPANCREATOGRAPHY (ERCP);  Surgeon: Clarene Essex, MD;  Location: [facility];  Service: Endoscopy;  Laterality: Left;   HERNIA REPAIR  Aug 2012   right indirect inguinal, with mesh   PANCREATIC STENT PLACEMENT  02/22/2021   Procedure: PANCREATIC STENT PLACEMENT;  Surgeon: Clarene Essex, MD;  Location: [facility];  Service: Endoscopy;;   REMOVAL OF STONES  02/22/2021   Procedure: REMOVAL OF STONES;  Surgeon: Clarene Essex, MD;  Location: [facility];  Service: Endoscopy;;   SPHINCTEROTOMY  02/22/2021   Procedure: Joan Mayans;  Surgeon: Clarene Essex, MD;  Location: [facility] ENDOSCOPY;  Service: Endoscopy;;    No Known Allergies  Outpatient Encounter Medications as of 01/26/2023  Medication Sig   acetaminophen (TYLENOL) 500 MG tablet Take 1,000 mg by mouth every 4 (four) hours as needed for mild pain or fever.   carboxymethylcellulose (ARTIFICIAL TEARS) 1 % ophthalmic solution Apply 1 drop to eye every  12 (twelve) hours as needed (dry eyes). Or in the affected eye four times a day as needed   ipratropium-albuterol (DUONEB) 0.5-2.5 (3) MG/3ML SOLN Take 3 mLs by nebulization every 6 (six) hours as needed.   ketoconazole (NIZORAL) 2 % shampoo Apply 1 Application topically 2 (two) times a week. Tuesday and Friday   metoprolol tartrate (LOPRESSOR) 25 MG tablet Take 0.5 tablets (12.5 mg total) by mouth 2 (two) times daily.    Multiple Vitamins-Minerals (MENS 50+ MULTI VITAMIN/MIN PO) Take 1 tablet by mouth daily.   Sodium Fluoride (PREVIDENT 5000 BOOSTER PLUS) 1.1 % PSTE Place 1 Application onto teeth daily.   vitamin B-12 (CYANOCOBALAMIN) 1000 MCG tablet Take 1,000 mcg by mouth daily.   [DISCONTINUED] tuberculin (TUBERSOL) 5 UNIT/0.1ML injection Inject 5 Units into the skin as directed. Every 12 months.   No facility-administered encounter medications on file as of 01/26/2023.    Review of Systems  Constitutional:  Negative for activity change, appetite change, fatigue and unexpected weight change.  HENT:  Negative for congestion and hearing loss.   Eyes: Negative.   Respiratory:  Negative for cough and shortness of breath.   Cardiovascular:  Negative for chest pain, palpitations and leg swelling.  Gastrointestinal:  Negative for abdominal pain, constipation and diarrhea.  Genitourinary:  Negative for difficulty urinating and dysuria.  Musculoskeletal:  Negative for arthralgias and myalgias.  Skin:  Negative for color change and wound.  Neurological:  Negative for dizziness and weakness.  Psychiatric/Behavioral:  Negative for agitation, behavioral problems and confusion.     Immunization History  Administered Date(s) Administered   Influenza, High Dose Seasonal PF 08/25/2022   Influenza, Seasonal, Injecte, Preservative Fre 09/08/2015, 09/03/2016   Influenza-Unspecified 09/12/2013, 08/18/2014   Moderna Covid-19 Vaccine Bivalent Booster 48yrs & up 08/01/2021, 04/07/2022   Moderna SARS-COV2 Booster Vaccination 09/20/2020, 03/28/2021   Moderna Sars-Covid-2 Vaccination 11/20/2019, 12/18/2019   PPD Test 04/29/2016   Pneumococcal Conjugate-13 01/23/2015   Pneumococcal Polysaccharide-23 01/20/2012, 06/22/2013   Tdap 08/21/2013   Zoster Recombinat (Shingrix) 08/02/2006   Zoster, Live 08/21/2013   Pertinent  Health Maintenance Due  Topic Date Due   FOOT EXAM  01/04/2018   HEMOGLOBIN A1C  03/11/2023    OPHTHALMOLOGY EXAM  08/28/2023   INFLUENZA VACCINE  Completed      02/26/2021    8:00 AM 02/26/2021    7:20 PM 02/27/2021    7:57 AM 02/27/2021    8:00 PM 02/28/2021    8:00 AM  Fall Risk  (RETIRED) Patient Fall Risk Level High fall risk High fall risk High fall risk High fall risk High fall risk   Functional Status Survey:    Vitals:   01/26/23 1402 01/26/23 1416  BP: (!) 144/77 (!) 153/62  Pulse: 76   Resp: 20   Temp: (!) 97.1 F (36.2 C)   SpO2: 98%   Weight: 156 lb 6.4 oz (70.9 kg)   Height: 5\' 8"  (1.727 m)    Body mass index is 23.78 kg/m. Physical Exam Constitutional:      General: He is not in acute distress.    Appearance: He is well-developed. He is not diaphoretic.  HENT:     Head: Normocephalic and atraumatic.     Right Ear: External ear normal.     Left Ear: External ear normal.     Mouth/Throat:     Pharynx: No oropharyngeal exudate.  Eyes:     Conjunctiva/sclera: Conjunctivae normal.     Pupils: Pupils are equal, round, and reactive  to light.  Cardiovascular:     Rate and Rhythm: Normal rate and regular rhythm.     Heart sounds: Normal heart sounds.  Pulmonary:     Effort: Pulmonary effort is normal.     Breath sounds: Normal breath sounds.  Abdominal:     General: Bowel sounds are normal.     Palpations: Abdomen is soft.  Musculoskeletal:        General: No tenderness.     Cervical back: Normal range of motion and neck supple.     Right lower leg: No edema.     Left lower leg: No edema.  Skin:    General: Skin is warm and dry.  Neurological:     Mental Status: He is alert and oriented to person, place, and time. Mental status is at baseline.  Psychiatric:        Mood and Affect: Mood normal.        Cognition and Memory: Cognition is impaired. Memory is impaired.     Labs reviewed: Recent Labs    09/10/22 0000  NA 140  K 4.1  CL 104  CO2 27*  BUN 11  CREATININE 0.7  CALCIUM 9.2   Recent Labs    09/10/22 0000  AST 20  ALT 10   ALKPHOS 92  ALBUMIN 3.9   Recent Labs    09/10/22 0000  WBC 5.9  HGB 13.3*  HCT 39*  PLT 236   Lab Results  Component Value Date   TSH 0.359 02/23/2021   Lab Results  Component Value Date   HGBA1C 6.9 09/10/2022   Lab Results  Component Value Date   CHOL 154 02/24/2021   HDL 44 02/24/2021   LDLCALC 92 02/24/2021   LDLDIRECT 124.0 01/04/2017   TRIG 91 02/24/2021   CHOLHDL 3.5 02/24/2021    Significant Diagnostic Results in last 30 days:  No results found.  Assessment/Plan 1. Diabetes mellitus type 2, diet-controlled ([place]) -Encouraged dietary compliance, routine foot care/monitoring and to keep up with diabetic eye exams through ophthalmology  -will follow A1c  2. SVT (supraventricular tachycardia) Rate controlled with metoprolol   3.  Prostate cancer PSA had been stable in the past, no active treatment. No symptoms at this time  4. Vascular dementia without behavioral disturbance (HCC) -Stable, no acute changes in cognitive or functional status, continue supportive care.   Carlos American. [place], [facility] [phone]

## 2023-03-10 LAB — BASIC METABOLIC PANEL
BUN: 11 (ref 4–21)
CO2: 29 — AB (ref 13–22)
Chloride: 104 (ref 99–108)
Creatinine: 0.7 (ref 0.6–1.3)
Glucose: 95
Potassium: 3.8 mEq/L (ref 3.5–5.1)
Sodium: 142 (ref 137–147)

## 2023-03-10 LAB — PSA: PSA: 3.32

## 2023-03-10 LAB — HEPATIC FUNCTION PANEL
ALT: 11 U/L (ref 10–40)
AST: 14 (ref 14–40)
Alkaline Phosphatase: 81 (ref 25–125)
Bilirubin, Total: 0.5

## 2023-03-10 LAB — CBC AND DIFFERENTIAL
HCT: 38 — AB (ref 41–53)
Hemoglobin: 12.1 — AB (ref 13.5–17.5)
Neutrophils Absolute: 1954
Platelets: 218 10*3/uL (ref 150–400)
WBC: 4.4

## 2023-03-10 LAB — CBC: RBC: 4.22 (ref 3.87–5.11)

## 2023-03-10 LAB — COMPREHENSIVE METABOLIC PANEL
Albumin: 3.5 (ref 3.5–5.0)
Calcium: 8.7 (ref 8.7–10.7)
Globulin: 2.2
eGFR: 86

## 2023-03-10 LAB — HEMOGLOBIN A1C: Hemoglobin A1C: 7.1

## 2023-03-29 ENCOUNTER — Encounter: Payer: Self-pay | Admitting: Student

## 2023-03-29 ENCOUNTER — Non-Acute Institutional Stay (SKILLED_NURSING_FACILITY): Payer: Medicare Other | Admitting: Student

## 2023-03-29 DIAGNOSIS — I480 Paroxysmal atrial fibrillation: Secondary | ICD-10-CM | POA: Diagnosis not present

## 2023-03-29 DIAGNOSIS — Z66 Do not resuscitate: Secondary | ICD-10-CM | POA: Diagnosis not present

## 2023-03-29 DIAGNOSIS — E119 Type 2 diabetes mellitus without complications: Secondary | ICD-10-CM | POA: Diagnosis not present

## 2023-03-29 DIAGNOSIS — F0150 Vascular dementia without behavioral disturbance: Secondary | ICD-10-CM

## 2023-03-29 NOTE — Progress Notes (Addendum)
Location:  Other [place].  Nursing Home Room Number: [facility] [address]A Place of Service:  SNF [phone]) Provider:  Earnestine Mealing, MD  Patient Care Team: Earnestine Mealing, MD as PCP - General (Family Medicine) Mariah Milling, Tollie Pizza, MD as Consulting Physician (Cardiology) Sherlene Shams, MD (Internal Medicine)  Extended Emergency Contact Information Primary Emergency Contact: Thissen,Janet Address: [address]          [place], [place] [address] [place] of [place] Home Phone: [phone] Relation: Spouse Secondary Emergency Contact: Cathlean Sauer Mobile Phone: [phone] Relation: Nephew  Code Status:  DNR Goals of care: Advanced Directive information    03/29/2023   10:28 AM  Advanced Directives  Does Patient Have a Medical Advance Directive? Yes  Type of Estate agent of [place];Out of facility DNR (pink MOST or yellow form);Living will  Does patient want to make changes to medical advance directive? No - Patient declined  Copy of Healthcare Power of Attorney in Chart? Yes - validated most recent copy scanned in chart (See row information)     Chief Complaint  Patient presents with   Medical Management of Chronic Issues    Medical Management of Chronic Issues.     HPI:  Pt is a 89 y.o. male seen today for medical management of chronic diseases.    Patient is doing well. He states "[facility]." Nursing states patient says this every Monday because they are closed on Sundays and he forgets.   No concerns from nursing at this time.    Past Medical History:  Diagnosis Date   Acute gallstone pancreatitis 02/22/2021   Ascending cholangitis 02/22/2021   Atrial fibrillation (HCC)    Choledocholithiasis with acute cholecystitis with obstruction 02/22/2021   Diabetes mellitus without complication (HCC)    E coli bacteremia 02/22/2021   Fatigue    HLD (hyperlipidemia)    Hypertension    OSA (obstructive  sleep apnea)    Radial fracture 2006   right, screws and plates   Sepsis due to Escherichia coli (E. coli) (HCC) 02/22/2021   SVT (supraventricular tachycardia) 2012   a. Holter in 2012 showed 27 episodes of SVT/atrial tach w/ longest being 36 beats; b. asymptomatic   Syncope 08/2015   a. short rhythm strip in [facility] ED showed junctional escape rhythm and AV dissociation with essentially sinus bradycardia   Ulnar fracture 1995   fall off of ladder   Past Surgical History:  Procedure Laterality Date   ERCP Left 02/22/2021   Procedure: ENDOSCOPIC RETROGRADE CHOLANGIOPANCREATOGRAPHY (ERCP);  Surgeon: Vida Rigger, MD;  Location: [facility] ENDOSCOPY;  Service: Endoscopy;  Laterality: Left;   HERNIA REPAIR  Aug 2012   right indirect inguinal, with mesh   PANCREATIC STENT PLACEMENT  02/22/2021   Procedure: PANCREATIC STENT PLACEMENT;  Surgeon: Vida Rigger, MD;  Location: [facility] ENDOSCOPY;  Service: Endoscopy;;   REMOVAL OF STONES  02/22/2021   Procedure: REMOVAL OF STONES;  Surgeon: Vida Rigger, MD;  Location: [facility] ENDOSCOPY;  Service: Endoscopy;;   SPHINCTEROTOMY  02/22/2021   Procedure: Dennison Mascot;  Surgeon: Vida Rigger, MD;  Location: [facility] ENDOSCOPY;  Service: Endoscopy;;    No Known Allergies  Outpatient Encounter Medications as of 03/29/2023  Medication Sig   acetaminophen (TYLENOL) 500 MG tablet Take 1,000 mg by mouth every 4 (four) hours as needed for mild pain or fever.   carboxymethylcellulose (ARTIFICIAL TEARS) 1 % ophthalmic solution Apply 1 drop to eye every 12 (twelve) hours as needed (  dry eyes). Or in the affected eye four times a day as needed   ipratropium-albuterol (DUONEB) 0.5-2.5 (3) MG/3ML SOLN Take 3 mLs by nebulization every 6 (six) hours as needed.   ketoconazole (NIZORAL) 2 % shampoo Apply 1 Application topically 2 (two) times a week. Tuesday and Friday   metoprolol tartrate (LOPRESSOR) 25 MG tablet Take 0.5 tablets (12.5 mg total) by mouth 2 (two) times daily.   Multiple  Vitamins-Minerals (MENS 50+ MULTI VITAMIN/MIN PO) Take 1 tablet by mouth daily.   Sodium Fluoride (PREVIDENT 5000 BOOSTER PLUS) 1.1 % PSTE Place 1 Application onto teeth daily.   vitamin B-12 (CYANOCOBALAMIN) 1000 MCG tablet Take 1,000 mcg by mouth daily.   No facility-administered encounter medications on file as of 03/29/2023.    Review of Systems  Immunization History  Administered Date(s) Administered   Covid-19, Mrna,Vaccine(Spikevax)49yrs and older 02/16/2023   Influenza, High Dose Seasonal PF 08/25/2022   Influenza, Seasonal, Injecte, Preservative Fre 09/08/2015, 09/03/2016   Influenza-Unspecified 09/12/2013, 08/18/2014   Moderna Covid-19 Vaccine Bivalent Booster 12yrs & up 08/01/2021, 04/07/2022   Moderna SARS-COV2 Booster Vaccination 09/20/2020, 03/28/2021   Moderna Sars-Covid-2 Vaccination 11/20/2019, 12/18/2019   PPD Test 04/29/2016   Pneumococcal Conjugate-13 01/23/2015   Pneumococcal Polysaccharide-23 01/20/2012, 06/22/2013   Tdap 08/21/2013   Zoster Recombinat (Shingrix) 08/02/2006   Zoster, Live 08/21/2013   Pertinent  Health Maintenance Due  Topic Date Due   HEMOGLOBIN A1C  03/11/2023   INFLUENZA VACCINE  06/10/2023   OPHTHALMOLOGY EXAM  08/28/2023   FOOT EXAM  03/15/2024      02/26/2021    8:00 AM 02/26/2021    7:20 PM 02/27/2021    7:57 AM 02/27/2021    8:00 PM 02/28/2021    8:00 AM  Fall Risk  (RETIRED) Patient Fall Risk Level High fall risk High fall risk High fall risk High fall risk High fall risk   Functional Status Survey:    Vitals:   03/29/23 1022 03/29/23 1023  BP: (!) 151/62 135/77  Pulse: 69   Resp: 20   Temp: 97.8 F (36.6 C)   SpO2: 96%   Weight: 155 lb (70.3 kg)   Height: 5\' 8"  (1.727 m)    Body mass index is 23.57 kg/m. Physical Exam Cardiovascular:     Rate and Rhythm: Normal rate.     Pulses: Normal pulses.  Pulmonary:     Effort: Pulmonary effort is normal.  Neurological:     Mental Status: He is alert. Mental status is at  baseline. He is disoriented.     Comments: Walks with a walker     Labs reviewed: Recent Labs    09/10/22 0000 03/10/23 0000  NA 140 142  K 4.1 3.8  CL 104 104  CO2 27* 29*  BUN 11 11  CREATININE 0.7 0.7  CALCIUM 9.2 8.7   Recent Labs    09/10/22 0000 03/10/23 0000  AST 20 14  ALT 10 11  ALKPHOS 92 81  ALBUMIN 3.9 3.5   Recent Labs    09/10/22 0000 03/10/23 0000  WBC 5.9 4.4  NEUTROABS  --  1,954.00  HGB 13.3* 12.1*  HCT 39* 38*  PLT 236 218   Lab Results  Component Value Date   TSH 0.359 02/23/2021   Lab Results  Component Value Date   HGBA1C 6.9 09/10/2022   Lab Results  Component Value Date   CHOL 154 02/24/2021   HDL 44 02/24/2021   LDLCALC 92 02/24/2021   LDLDIRECT 124.0 01/04/2017  TRIG 91 02/24/2021   CHOLHDL 3.5 02/24/2021    Significant Diagnostic Results in last 30 days:  No results found.  Assessment/Plan 1. Diabetes mellitus type 2, diet-controlled (HCC) Most recent A1c 7.1 on 03/10/23. Diet controlled. Continue with quarterly checks.   2. Vascular dementia without behavioral disturbance (HCC) Stable at this time. No behavioral concerns. Weight stable. Ambulatory. Continue supportive care. Continue vitamin B12.   3. PAF (paroxysmal atrial fibrillation) (HCC) No AC. Continue metoprol 25 mg BID.   4. Do not resuscitate Patient has an active DNR.    Family/ staff Communication: nursing  Labs/tests ordered:  none

## 2023-05-04 ENCOUNTER — Encounter: Payer: Self-pay | Admitting: Nurse Practitioner

## 2023-05-04 ENCOUNTER — Non-Acute Institutional Stay (INDEPENDENT_AMBULATORY_CARE_PROVIDER_SITE_OTHER): Payer: Medicare Other | Admitting: Nurse Practitioner

## 2023-05-04 DIAGNOSIS — Z0000 Encounter for general adult medical examination without abnormal findings: Secondary | ICD-10-CM

## 2023-05-04 NOTE — Patient Instructions (Signed)
  Hector Lucero , Thank you for taking time to come for your Medicare Wellness Visit. I appreciate your ongoing commitment to your health goals. Please review the following plan we discussed and let me know if I can assist you in the future.   These are the goals we discussed:  Goals   None     This is a list of the screening recommended for you and due dates:  Health Maintenance  Topic Date Due   Zoster (Shingles) Vaccine (2 of 2) 09/27/2006   Hemoglobin A1C  03/11/2023   COVID-19 Vaccine (6 - 2023-24 season) 04/13/2023   Flu Shot  06/10/2023   DTaP/Tdap/Td vaccine (2 - Td or Tdap) 08/22/2023   Eye exam for diabetics  08/28/2023   Complete foot exam   03/15/2024   Medicare Annual Wellness Visit  05/03/2024   Pneumonia Vaccine  Completed   HPV Vaccine  Aged Out

## 2023-05-04 NOTE — Progress Notes (Signed)
Subjective:   Hector Lucero is a 89 y.o. male who presents for Medicare Annual/Subsequent preventive examination.  Visit Complete: In person   Review of Systems     Cardiac Risk Factors include: advanced age (>3men, >76 women);hypertension;diabetes mellitus;dyslipidemia     Objective:    Today's Vitals   05/04/23 1450  BP: 136/78  Pulse: 71  Resp: 20  Temp: 97.8 F (36.6 C)  SpO2: 96%  Weight: 158 lb (71.7 kg)  Height: 5\' 8"  (1.727 m)   Body mass index is 24.02 kg/m.     05/04/2023    2:54 PM 03/29/2023   10:28 AM 01/26/2023    2:07 PM 12/08/2022    4:18 PM 11/26/2022   10:30 AM 09/16/2022   10:55 AM 09/15/2022    1:27 PM  Advanced Directives  Does Patient Have a Medical Advance Directive? Yes Yes Yes Yes Yes Yes Yes  Type of Estate agent of [place];Out of facility DNR (pink MOST or yellow form);Living will Healthcare Power of [place];Out of facility DNR (pink MOST or yellow form);Living will Healthcare Power of [place];Living will;Out of facility DNR (pink MOST or yellow form) Healthcare Power of [place];Living will;Out of facility DNR (pink MOST or yellow form) Healthcare Power of [place];Living will;Out of facility DNR (pink MOST or yellow form) Healthcare Power of [place];Living will;Out of facility DNR (pink MOST or yellow form) Healthcare Power of [place];Out of facility DNR (pink MOST or yellow form);Living will  Does patient want to make changes to medical advance directive? No - Patient declined No - Patient declined No - Patient declined No - Patient declined No - Patient declined No - Patient declined No - Patient declined  Copy of Healthcare Power of Attorney in Chart? Yes - validated most recent copy scanned in chart (See row information) Yes - validated most recent copy scanned in chart (See row information) Yes - validated most recent copy scanned in chart (See row information) Yes - validated most recent copy scanned in chart (See  row information) Yes - validated most recent copy scanned in chart (See row information) Yes - validated most recent copy scanned in chart (See row information) Yes - validated most recent copy scanned in chart (See row information)  Pre-existing out of facility DNR order (yellow form or pink MOST form)    Yellow form placed in chart (order not valid for inpatient use) Yellow form placed in chart (order not valid for inpatient use)  Yellow form placed in chart (order not valid for inpatient use)    Current Medications (verified) Outpatient Encounter Medications as of 05/04/2023  Medication Sig   acetaminophen (TYLENOL) 500 MG tablet Take 1,000 mg by mouth every 4 (four) hours as needed for mild pain or fever.   carboxymethylcellulose (ARTIFICIAL TEARS) 1 % ophthalmic solution Apply 1 drop to eye every 12 (twelve) hours as needed (dry eyes). Or in the affected eye four times a day as needed   ipratropium-albuterol (DUONEB) 0.5-2.5 (3) MG/3ML SOLN Take 3 mLs by nebulization every 6 (six) hours as needed.   ketoconazole (NIZORAL) 2 % shampoo Apply 1 Application topically 2 (two) times a week. Tuesday and Friday   metoprolol tartrate (LOPRESSOR) 25 MG tablet Take 0.5 tablets (12.5 mg total) by mouth 2 (two) times daily.   Multiple Vitamins-Minerals (MENS 50+ MULTI VITAMIN/MIN PO) Take 1 tablet by mouth daily.   Sodium Fluoride (PREVIDENT 5000 BOOSTER PLUS) 1.1 % PSTE Place 1 Application onto teeth daily.   vitamin B-12 (  CYANOCOBALAMIN) 1000 MCG tablet Take 1,000 mcg by mouth daily.   No facility-administered encounter medications on file as of 05/04/2023.    Allergies (verified) Patient has no known allergies.   History: Past Medical History:  Diagnosis Date   Acute gallstone pancreatitis 02/22/2021   Ascending cholangitis 02/22/2021   Atrial fibrillation (HCC)    Choledocholithiasis with acute cholecystitis with obstruction 02/22/2021   Diabetes mellitus without complication (HCC)    E coli  bacteremia 02/22/2021   Fatigue    HLD (hyperlipidemia)    Hypertension    OSA (obstructive sleep apnea)    Radial fracture 2006   right, screws and plates   Sepsis due to Escherichia coli (E. coli) (HCC) 02/22/2021   SVT (supraventricular tachycardia) 2012   a. Holter in 2012 showed 27 episodes of SVT/atrial tach w/ longest being 36 beats; b. asymptomatic   Syncope 08/2015   a. short rhythm strip in [facility] ED showed junctional escape rhythm and AV dissociation with essentially sinus bradycardia   Ulnar fracture 1995   fall off of ladder   Past Surgical History:  Procedure Laterality Date   ERCP Left 02/22/2021   Procedure: ENDOSCOPIC RETROGRADE CHOLANGIOPANCREATOGRAPHY (ERCP);  Surgeon: Vida Rigger, MD;  Location: [facility] ENDOSCOPY;  Service: Endoscopy;  Laterality: Left;   HERNIA REPAIR  Aug 2012   right indirect inguinal, with mesh   PANCREATIC STENT PLACEMENT  02/22/2021   Procedure: PANCREATIC STENT PLACEMENT;  Surgeon: Vida Rigger, MD;  Location: [facility] ENDOSCOPY;  Service: Endoscopy;;   REMOVAL OF STONES  02/22/2021   Procedure: REMOVAL OF STONES;  Surgeon: Vida Rigger, MD;  Location: [facility] ENDOSCOPY;  Service: Endoscopy;;   SPHINCTEROTOMY  02/22/2021   Procedure: Dennison Mascot;  Surgeon: Vida Rigger, MD;  Location: [facility] ENDOSCOPY;  Service: Endoscopy;;   Family History  Problem Relation Age of Onset   Diabetes Mother    Social History   Socioeconomic History   Marital status: Married    Spouse name: Not on file   Number of children: 0   Years of education: Not on file   Highest education level: Not on file  Occupational History   Occupation: Retired    Comment: Market researcher.  Tobacco Use   Smoking status: Never   Smokeless tobacco: Never  Vaping Use   Vaping Use: Never used  Substance and Sexual Activity   Alcohol use: Yes    Alcohol/week: 3.0 standard drinks of alcohol    Types: 3 Glasses of wine per week    Comment: occasional   Drug use:  No   Sexual activity: Not Currently  Other Topics Concern   Not on file  Social History Narrative   Exercises regularly 5 days weekly at the [facility]   4 degrees-- 2 Bachelor's, MBA and MA      Has living will   Wife is health care POA--- nephew Homero Fellers [place]--is alternate   Has DNR already   No tube feeds if cognitively unaware   Social Determinants of Health   Financial Resource Strain: Not on file  Food Insecurity: Not on file  Transportation Needs: Not on file  Physical Activity: Not on file  Stress: Not on file  Social Connections: Not on file    Tobacco Counseling Counseling given: Not Answered   Clinical Intake:  Pre-visit preparation completed: Yes  Pain : No/denies pain     BMI - recorded: 24 Nutritional Risks: None Diabetes: Yes  How often do you need to have someone help  you when you read instructions, pamphlets, or other written materials from your doctor or pharmacy?: 3 - Sometimes         Activities of Daily Living    05/04/2023    2:51 PM  In your present state of health, do you have any difficulty performing the following activities:  Hearing? 0  Vision? 0  Difficulty concentrating or making decisions? 1  Walking or climbing stairs? 0  Dressing or bathing? 1  Comment helps with bathing  Doing errands, shopping? 1  Preparing Food and eating ? Y  Comment feeds himself  Using the Toilet? N  In the past six months, have you accidently leaked urine? N  Do you have problems with loss of bowel control? N  Managing your Medications? Y  Managing your Finances? Y  Housekeeping or managing your Housekeeping? Y    Patient Care Team: Earnestine Mealing, MD as PCP - General (Family Medicine) Antonieta Iba, MD as Consulting Physician (Cardiology) Sherlene Shams, MD (Internal Medicine)  Indicate any recent Medical Services you may have received from other than [facility] providers in the past year (date may be approximate).     Assessment:   This is  a routine wellness examination for [place].  Hearing/Vision screen No results found.  Dietary issues and exercise activities discussed:     Goals Addressed   None    Depression Screen    01/04/2017    2:54 PM 01/02/2016    2:29 PM 01/26/2015    3:58 PM 03/07/2014   10:39 AM 09/21/2013   10:23 AM  PHQ 2/9 Scores  PHQ - 2 Score 0 0 0 0 0    Fall Risk    05/04/2023    2:54 PM 01/04/2017    2:54 PM 01/02/2016    2:29 PM 01/26/2015    3:58 PM 03/07/2014   10:39 AM  Fall Risk   Falls in the past year? 0 No Yes Yes No  Number falls in past yr:   1    Injury with Fall?   Yes    Risk for fall due to :    Impaired vision   Follow up   Falls prevention discussed      MEDICARE RISK AT HOME:   TIMED UP AND GO:  Was the test performed?  No    Cognitive Function:        Immunizations Immunization History  Administered Date(s) Administered   Covid-19, Mrna,Vaccine(Spikevax)5yrs and older 02/16/2023   Influenza, High Dose Seasonal PF 08/25/2022   Influenza, Seasonal, Injecte, Preservative Fre 09/08/2015, 09/03/2016   Influenza-Unspecified 09/12/2013, 08/18/2014   Moderna Covid-19 Vaccine Bivalent Booster 56yrs & up 08/01/2021, 04/07/2022   Moderna SARS-COV2 Booster Vaccination 09/20/2020, 03/28/2021   Moderna Sars-Covid-2 Vaccination 11/20/2019, 12/18/2019   PPD Test 04/29/2016   Pneumococcal Conjugate-13 01/23/2015   Pneumococcal Polysaccharide-23 01/20/2012, 06/22/2013   Tdap 08/21/2013   Zoster Recombinat (Shingrix) 08/02/2006   Zoster, Live 08/21/2013    TDAP status: Up to date  Flu Vaccine status: Up to date  Pneumococcal vaccine status: Up to date  Covid-19 vaccine status: Information provided on how to obtain vaccines.   Qualifies for Shingles Vaccine? Yes   Zostavax completed No   Shingrix Completed?: No.    Education has been provided regarding the importance of this vaccine. Patient has been advised to call insurance company to determine out of pocket  expense if they have not yet received this vaccine. Advised may also receive vaccine at local pharmacy  or Health Dept. Verbalized acceptance and understanding.  Screening Tests Health Maintenance  Topic Date Due   Zoster Vaccines- Shingrix (2 of 2) 09/27/2006   HEMOGLOBIN A1C  03/11/2023   COVID-19 Vaccine (6 - 2023-24 season) 04/13/2023   INFLUENZA VACCINE  06/10/2023   DTaP/Tdap/Td (2 - Td or Tdap) 08/22/2023   OPHTHALMOLOGY EXAM  08/28/2023   FOOT EXAM  03/15/2024   Medicare Annual Wellness (AWV)  05/03/2024   Pneumonia Vaccine 4+ Years old  Completed   HPV VACCINES  Aged Out    Health Maintenance  Health Maintenance Due  Topic Date Due   Zoster Vaccines- Shingrix (2 of 2) 09/27/2006   HEMOGLOBIN A1C  03/11/2023   COVID-19 Vaccine (6 - 2023-24 season) 04/13/2023    Colorectal cancer screening: No longer required.   Lung Cancer Screening: (Low Dose CT Chest recommended if Age 51-80 years, 20 pack-year currently smoking OR have quit w/in 15years.) does not qualify.   Lung Cancer Screening Referral: na  Additional Screening:  Hepatitis C Screening: does not qualify; Completed   Vision Screening: Recommended annual ophthalmology exams for early detection of glaucoma and other disorders of the eye. Is the patient up to date with their annual eye exam?  Yes  Who is the provider or what is the name of the office in which the patient attends annual eye exams?  eye If pt is not established with a provider, would they like to be referred to a provider to establish care? No .   Dental Screening: Recommended annual dental exams for proper oral hygiene   Community Resource Referral / Chronic Care Management: CRR required this visit?  No   CCM required this visit?  No     Plan:     I have personally reviewed and noted the following in the patient's chart:   Medical and social history Use of alcohol, tobacco or illicit drugs  Current medications and supplements  including opioid prescriptions. Patient is not currently taking opioid prescriptions. Functional ability and status Nutritional status Physical activity Advanced directives List of other physicians Hospitalizations, surgeries, and ER visits in previous 12 months Vitals Screenings to include cognitive, depression, and falls Referrals and appointments  In addition, I have reviewed and discussed with patient certain preventive protocols, quality metrics, and best practice recommendations. A written personalized care plan for preventive services as well as general preventive health recommendations were provided to patient.     Sharon Seller, NP   05/04/2023

## 2023-05-18 ENCOUNTER — Encounter: Payer: Self-pay | Admitting: Nurse Practitioner

## 2023-05-18 ENCOUNTER — Non-Acute Institutional Stay: Payer: Self-pay | Admitting: Nurse Practitioner

## 2023-05-18 DIAGNOSIS — D649 Anemia, unspecified: Secondary | ICD-10-CM

## 2023-05-18 DIAGNOSIS — F0150 Vascular dementia without behavioral disturbance: Secondary | ICD-10-CM

## 2023-05-18 DIAGNOSIS — E119 Type 2 diabetes mellitus without complications: Secondary | ICD-10-CM

## 2023-05-18 DIAGNOSIS — I480 Paroxysmal atrial fibrillation: Secondary | ICD-10-CM | POA: Diagnosis not present

## 2023-05-18 NOTE — Progress Notes (Signed)
Location:  Other [facility]) Nursing Home Room Number: [phone] [id number] Place of Service:  SNF (31)  Aviella Disbrow K. Janyth Contes, NP   Patient Care Team: Earnestine Mealing, MD as PCP - General (Family Medicine) Mariah Milling, Tollie Pizza, MD as Consulting Physician (Cardiology) Sherlene Shams, MD (Internal Medicine)  Extended Emergency Contact Information Primary Emergency Contact: Bache,Janet Address: [address]          [place], [place] [address] Darden Amber of [place] Home Phone: [phone] Relation: Spouse Secondary Emergency Contact: Cathlean Sauer Mobile Phone: [phone] Relation: Nephew  Goals of care: Advanced Directive information    05/18/2023    8:52 AM  Advanced Directives  Does Patient Have a Medical Advance Directive? Yes  Type of Estate agent of [place];Out of facility DNR (pink MOST or yellow form);Living will  Does patient want to make changes to medical advance directive? No - Patient declined  Copy of Healthcare Power of Attorney in Chart? Yes - validated most recent copy scanned in chart (See row information)  Pre-existing out of facility DNR order (yellow form or pink MOST form) Yellow form placed in chart (order not valid for inpatient use)     Chief Complaint  Patient presents with   Medical Management of Chronic Issues    Routine visit. Discuss need for shingrix and covid boosters.     HPI:  Pt is a 89 y.o. male seen today for medical management of chronic disease. Pt with hx of dementia, a fib, dm. He is a long term resident of [facility] but attends the day program at [employer] in [place].  He walks with a walker and gets around the facility with minimal assistance. No reports of pain or discomfort.  Nursing has no concerns and patient without complaints.   Past Medical History:  Diagnosis Date   Acute gallstone pancreatitis 02/22/2021   Ascending cholangitis 02/22/2021   Atrial fibrillation (HCC)    Choledocholithiasis with  acute cholecystitis with obstruction 02/22/2021   Diabetes mellitus without complication (HCC)    E coli bacteremia 02/22/2021   Fatigue    HLD (hyperlipidemia)    Hypertension    OSA (obstructive sleep apnea)    Radial fracture 2006   right, screws and plates   Sepsis due to Escherichia coli (E. coli) (HCC) 02/22/2021   SVT (supraventricular tachycardia) 2012   a. Holter in 2012 showed 27 episodes of SVT/atrial tach w/ longest being 36 beats; b. asymptomatic   Syncope 08/2015   a. short rhythm strip in [facility] ED showed junctional escape rhythm and AV dissociation with essentially sinus bradycardia   Ulnar fracture 1995   fall off of ladder   Past Surgical History:  Procedure Laterality Date   ERCP Left 02/22/2021   Procedure: ENDOSCOPIC RETROGRADE CHOLANGIOPANCREATOGRAPHY (ERCP);  Surgeon: Vida Rigger, MD;  Location: [facility] ENDOSCOPY;  Service: Endoscopy;  Laterality: Left;   HERNIA REPAIR  Aug 2012   right indirect inguinal, with mesh   PANCREATIC STENT PLACEMENT  02/22/2021   Procedure: PANCREATIC STENT PLACEMENT;  Surgeon: Vida Rigger, MD;  Location: [facility] ENDOSCOPY;  Service: Endoscopy;;   REMOVAL OF STONES  02/22/2021   Procedure: REMOVAL OF STONES;  Surgeon: Vida Rigger, MD;  Location: [facility] ENDOSCOPY;  Service: Endoscopy;;   SPHINCTEROTOMY  02/22/2021   Procedure: Dennison Mascot;  Surgeon: Vida Rigger, MD;  Location: [facility] ENDOSCOPY;  Service: Endoscopy;;    No Known Allergies  Outpatient Encounter Medications as of 05/18/2023  Medication Sig   acetaminophen (TYLENOL) 500 MG  tablet Take 1,000 mg by mouth every 4 (four) hours as needed for mild pain or fever.   carboxymethylcellulose (ARTIFICIAL TEARS) 1 % ophthalmic solution Apply 1 drop to eye every 12 (twelve) hours as needed (dry eyes). Or in the affected eye four times a day as needed   ipratropium-albuterol (DUONEB) 0.5-2.5 (3) MG/3ML SOLN Take 3 mLs by nebulization every 6 (six) hours as needed.   ketoconazole (NIZORAL) 2 % shampoo  Apply 1 Application topically 2 (two) times a week. Tuesday and Friday   metoprolol tartrate (LOPRESSOR) 25 MG tablet Take 0.5 tablets (12.5 mg total) by mouth 2 (two) times daily.   Multiple Vitamins-Minerals (MENS 50+ MULTI VITAMIN/MIN PO) Take 1 tablet by mouth daily.   Sodium Fluoride (PREVIDENT 5000 BOOSTER PLUS) 1.1 % PSTE Place 1 Application onto teeth daily.   vitamin B-12 (CYANOCOBALAMIN) 1000 MCG tablet Take 1,000 mcg by mouth daily.   No facility-administered encounter medications on file as of 05/18/2023.    Review of Systems  Constitutional:  Negative for activity change, appetite change, fatigue and unexpected weight change.  HENT:  Negative for congestion and hearing loss.   Eyes: Negative.   Respiratory:  Negative for cough and shortness of breath.   Cardiovascular:  Negative for chest pain, palpitations and leg swelling.  Gastrointestinal:  Negative for abdominal pain, constipation and diarrhea.  Genitourinary:  Negative for difficulty urinating and dysuria.  Musculoskeletal:  Negative for arthralgias and myalgias.  Skin:  Negative for color change and wound.  Neurological:  Negative for dizziness and weakness.  Psychiatric/Behavioral:  Positive for confusion. Negative for agitation and behavioral problems.      Immunization History  Administered Date(s) Administered   Covid-19, Mrna,Vaccine(Spikevax)30yrs and older 02/16/2023   Influenza, High Dose Seasonal PF 08/25/2022   Influenza, Seasonal, Injecte, Preservative Fre 09/08/2015, 09/03/2016   Influenza-Unspecified 09/12/2013, 08/18/2014   Moderna Covid-19 Vaccine Bivalent Booster 38yrs & up 08/01/2021, 04/07/2022   Moderna SARS-COV2 Booster Vaccination 09/20/2020, 03/28/2021   Moderna Sars-Covid-2 Vaccination 11/20/2019, 12/18/2019   PPD Test 04/29/2016   Pneumococcal Conjugate-13 01/23/2015   Pneumococcal Polysaccharide-23 01/20/2012, 06/22/2013   Tdap 08/21/2013   Zoster Recombinant(Shingrix) 08/02/2006    Zoster, Live 08/21/2013   Pertinent  Health Maintenance Due  Topic Date Due   INFLUENZA VACCINE  06/10/2023   OPHTHALMOLOGY EXAM  08/28/2023   HEMOGLOBIN A1C  09/10/2023   FOOT EXAM  03/15/2024      02/26/2021    7:20 PM 02/27/2021    7:57 AM 02/27/2021    8:00 PM 02/28/2021    8:00 AM 05/04/2023    2:54 PM  Fall Risk  Falls in the past year?     0  (RETIRED) Patient Fall Risk Level High fall risk High fall risk High fall risk High fall risk    Functional Status Survey:    Vitals:   05/18/23 0851  BP: 138/72  Pulse: 72  Weight: 156 lb (70.8 kg)  Height: 5\' 8"  (1.727 m)   Body mass index is 23.72 kg/m. Physical Exam Constitutional:      General: He is not in acute distress.    Appearance: He is well-developed. He is not diaphoretic.  HENT:     Head: Normocephalic and atraumatic.     Right Ear: External ear normal.     Left Ear: External ear normal.     Mouth/Throat:     Pharynx: No oropharyngeal exudate.  Eyes:     Conjunctiva/sclera: Conjunctivae normal.     Pupils: Pupils  are equal, round, and reactive to light.  Cardiovascular:     Rate and Rhythm: Normal rate and regular rhythm.     Heart sounds: Normal heart sounds.  Pulmonary:     Effort: Pulmonary effort is normal.     Breath sounds: Normal breath sounds.  Abdominal:     General: Bowel sounds are normal.     Palpations: Abdomen is soft.  Musculoskeletal:        General: No tenderness.     Cervical back: Normal range of motion and neck supple.     Right lower leg: No edema.     Left lower leg: No edema.  Skin:    General: Skin is warm and dry.  Neurological:     Mental Status: He is alert and oriented to person, place, and time.     Labs reviewed: Recent Labs    09/10/22 0000 03/10/23 0000  NA 140 142  K 4.1 3.8  CL 104 104  CO2 27* 29*  BUN 11 11  CREATININE 0.7 0.7  CALCIUM 9.2 8.7   Recent Labs    09/10/22 0000 03/10/23 0000  AST 20 14  ALT 10 11  ALKPHOS 92 81  ALBUMIN 3.9 3.5    Recent Labs    09/10/22 0000 03/10/23 0000  WBC 5.9 4.4  NEUTROABS  --  1,954.00  HGB 13.3* 12.1*  HCT 39* 38*  PLT 236 218   Lab Results  Component Value Date   TSH 0.359 02/23/2021   Lab Results  Component Value Date   HGBA1C 7.1 03/10/2023   Lab Results  Component Value Date   CHOL 154 02/24/2021   HDL 44 02/24/2021   LDLCALC 92 02/24/2021   LDLDIRECT 124.0 01/04/2017   TRIG 91 02/24/2021   CHOLHDL 3.5 02/24/2021    Significant Diagnostic Results in last 30 days:  No results found.  Assessment/Plan 1. PAF (paroxysmal atrial fibrillation) (HCC) Rate controlled on metoprolol. Not on any anticoagulation at this time.   2. Vascular dementia without behavioral disturbance (HCC) -Stable, no acute changes in cognitive or functional status, continue supportive care from staff.  3. Anemia, unspecified type Note on labs, no signs of bleeding. Will monitor.   4. Diabetes mellitus type 2, diet-controlled (HCC) -continues with dietary modifications, A1c at goal.    Shanda Bumps K. Biagio Borg [facility] [phone]

## 2023-06-21 ENCOUNTER — Encounter: Payer: Self-pay | Admitting: Student

## 2023-06-21 NOTE — Progress Notes (Unsigned)
A user error has taken place: encounter opened in error, closed for administrative reasons.

## 2023-07-19 ENCOUNTER — Encounter: Payer: Self-pay | Admitting: Student

## 2023-07-19 ENCOUNTER — Non-Acute Institutional Stay (SKILLED_NURSING_FACILITY): Payer: Self-pay | Admitting: Student

## 2023-07-19 DIAGNOSIS — S02119A Unspecified fracture of occiput, initial encounter for closed fracture: Secondary | ICD-10-CM | POA: Insufficient documentation

## 2023-07-19 DIAGNOSIS — I480 Paroxysmal atrial fibrillation: Secondary | ICD-10-CM

## 2023-07-19 DIAGNOSIS — Z0000 Encounter for general adult medical examination without abnormal findings: Secondary | ICD-10-CM

## 2023-07-19 DIAGNOSIS — F0150 Vascular dementia without behavioral disturbance: Secondary | ICD-10-CM | POA: Diagnosis not present

## 2023-07-19 DIAGNOSIS — R030 Elevated blood-pressure reading, without diagnosis of hypertension: Secondary | ICD-10-CM

## 2023-07-19 DIAGNOSIS — E119 Type 2 diabetes mellitus without complications: Secondary | ICD-10-CM | POA: Diagnosis not present

## 2023-07-19 DIAGNOSIS — Z66 Do not resuscitate: Secondary | ICD-10-CM

## 2023-07-19 NOTE — Progress Notes (Signed)
Location:  Other [facility]) Nursing Home Room Number: [place] 512-A Place of Service:  SNF [phone]) Provider:  Earnestine Mealing, MD  Patient Care Team: Earnestine Mealing, MD as PCP - General (Family Medicine) Mariah Milling, Tollie Pizza, MD as Consulting Physician (Cardiology) Sherlene Shams, MD (Internal Medicine)  Extended Emergency Contact Information Primary Emergency Contact: Skufca,Janet Address: [address]          [place], [place] [address] D[place] of [place] Home Phone: [phone] Relation: Spouse Secondary Emergency Contact: Cathlean Sauer Mobile Phone: [phone] Relation: Nephew  Code Status:  DNR Goals of care: Advanced Directive information    07/19/2023   10:46 AM  Advanced Directives  Does Patient Have a Medical Advance Directive? Yes  Type of Estate agent of [place];Living will;Out of facility DNR (pink MOST or yellow form)  Does patient want to make changes to medical advance directive? No - Patient declined  Copy of Healthcare Power of Attorney in Chart? Yes - validated most recent copy scanned in chart (See row information)  Pre-existing out of facility DNR order (yellow form or pink MOST form) Yellow form placed in chart (order not valid for inpatient use)     Chief Complaint  Patient presents with   Medical Management of Chronic Issues    Routine Visit   Immunizations    Shingrix, Influenza and Covid     HPI:  Pt is a 89 y.o. male seen today for medical management of chronic diseases.    He says he is doing well and denies concerns at this time. Denies chest pain or shortness of breath. Eating well. Urinating well. Normal Bowel movements.   Enjoys working on puzzles and going to [employer] during the day for dementia adult day center.   He is independent in dressing, feeding, and ambulates with a rolling walker.   Nursing without concerns at this time.   Past Medical History:  Diagnosis Date   Acute  gallstone pancreatitis 02/22/2021   Ascending cholangitis 02/22/2021   Atrial fibrillation (HCC)    Choledocholithiasis with acute cholecystitis with obstruction 02/22/2021   Diabetes mellitus without complication (HCC)    E coli bacteremia 02/22/2021   Fatigue    HLD (hyperlipidemia)    Hypertension    OSA (obstructive sleep apnea)    Radial fracture 2006   right, screws and plates   Sepsis due to Escherichia coli (E. coli) (HCC) 02/22/2021   SVT (supraventricular tachycardia) 2012   a. Holter in 2012 showed 27 episodes of SVT/atrial tach w/ longest being 36 beats; b. asymptomatic   Syncope 08/2015   a. short rhythm strip in [facility] ED showed junctional escape rhythm and AV dissociation with essentially sinus bradycardia   Ulnar fracture 1995   fall off of ladder   Past Surgical History:  Procedure Laterality Date   ERCP Left 02/22/2021   Procedure: ENDOSCOPIC RETROGRADE CHOLANGIOPANCREATOGRAPHY (ERCP);  Surgeon: Vida Rigger, MD;  Location: [facility] ENDOSCOPY;  Service: Endoscopy;  Laterality: Left;   HERNIA REPAIR  Aug 2012   right indirect inguinal, with mesh   PANCREATIC STENT PLACEMENT  02/22/2021   Procedure: PANCREATIC STENT PLACEMENT;  Surgeon: Vida Rigger, MD;  Location: [facility] ENDOSCOPY;  Service: Endoscopy;;   REMOVAL OF STONES  02/22/2021   Procedure: REMOVAL OF STONES;  Surgeon: Vida Rigger, MD;  Location: [facility] ENDOSCOPY;  Service: Endoscopy;;   SPHINCTEROTOMY  02/22/2021   Procedure: Dennison Mascot;  Surgeon: Vida Rigger, MD;  Location: [facility] ENDOSCOPY;  Service: Endoscopy;;  No Known Allergies  Outpatient Encounter Medications as of 07/19/2023  Medication Sig   acetaminophen (TYLENOL) 500 MG tablet Take 1,000 mg by mouth every 4 (four) hours as needed for mild pain or fever.   carboxymethylcellulose (ARTIFICIAL TEARS) 1 % ophthalmic solution Apply 1 drop to eye every 6 (six) hours as needed (dry eyes). Or in the affected eye four times a day as needed   ipratropium-albuterol (DUONEB)  0.5-2.5 (3) MG/3ML SOLN Take 3 mLs by nebulization every 6 (six) hours as needed.   ketoconazole (NIZORAL) 2 % shampoo Apply 1 Application topically 2 (two) times a week. Tuesday and Friday   metoprolol tartrate (LOPRESSOR) 25 MG tablet Take 0.5 tablets (12.5 mg total) by mouth 2 (two) times daily.   Multiple Vitamins-Minerals (MENS 50+ MULTI VITAMIN/MIN PO) Take 1 tablet by mouth daily.   Sodium Fluoride (PREVIDENT 5000 BOOSTER PLUS) 1.1 % PSTE Place 1 Application onto teeth daily.   vitamin B-12 (CYANOCOBALAMIN) 1000 MCG tablet Take 1,000 mcg by mouth daily.   No facility-administered encounter medications on file as of 07/19/2023.    Review of Systems  Immunization History  Administered Date(s) Administered   Covid-19, Mrna,Vaccine(Spikevax)66yrs and older 02/16/2023   Influenza, High Dose Seasonal PF 08/25/2022   Influenza, Seasonal, Injecte, Preservative Fre 09/08/2015, 09/03/2016   Influenza-Unspecified 09/12/2013, 08/18/2014   Moderna Covid-19 Vaccine Bivalent Booster 74yrs & up 08/01/2021, 04/07/2022   Moderna SARS-COV2 Booster Vaccination 09/20/2020, 03/28/2021   Moderna Sars-Covid-2 Vaccination 11/20/2019, 12/18/2019   PPD Test 04/29/2016   Pneumococcal Conjugate-13 01/23/2015   Pneumococcal Polysaccharide-23 01/20/2012, 06/22/2013   Tdap 08/21/2013   Zoster Recombinant(Shingrix) 08/02/2006   Zoster, Live 08/21/2013   Pertinent  Health Maintenance Due  Topic Date Due   INFLUENZA VACCINE  06/10/2023   OPHTHALMOLOGY EXAM  08/28/2023   HEMOGLOBIN A1C  09/10/2023   FOOT EXAM  03/15/2024      02/26/2021    7:20 PM 02/27/2021    7:57 AM 02/27/2021    8:00 PM 02/28/2021    8:00 AM 05/04/2023    2:54 PM  Fall Risk  Falls in the past year?     0  (RETIRED) Patient Fall Risk Level High fall risk High fall risk High fall risk High fall risk    Functional Status Survey:    Vitals:   07/19/23 1041  BP: (!) 151/55  Pulse: 84  Resp: 20  Temp: 97.8 F (36.6 C)  SpO2: 97%   Weight: 159 lb (72.1 kg)  Height: 5\' 8"  (1.727 m)   Body mass index is 24.18 kg/m. Physical Exam Vitals reviewed.  Constitutional:      Appearance: Normal appearance.  Cardiovascular:     Rate and Rhythm: Normal rate and regular rhythm.  Neurological:     Mental Status: He is alert.     Labs reviewed: Recent Labs    09/10/22 0000 03/10/23 0000  NA 140 142  K 4.1 3.8  CL 104 104  CO2 27* 29*  BUN 11 11  CREATININE 0.7 0.7  CALCIUM 9.2 8.7   Recent Labs    09/10/22 0000 03/10/23 0000  AST 20 14  ALT 10 11  ALKPHOS 92 81  ALBUMIN 3.9 3.5   Recent Labs    09/10/22 0000 03/10/23 0000  WBC 5.9 4.4  NEUTROABS  --  1,954.00  HGB 13.3* 12.1*  HCT 39* 38*  PLT 236 218   Lab Results  Component Value Date   TSH 0.359 02/23/2021   Lab Results  Component Value Date   HGBA1C 7.1 03/10/2023   Lab Results  Component Value Date   CHOL 154 02/24/2021   HDL 44 02/24/2021   LDLCALC 92 02/24/2021   LDLDIRECT 124.0 01/04/2017   TRIG 91 02/24/2021   CHOLHDL 3.5 02/24/2021    Significant Diagnostic Results in last 30 days:  No results found.  Assessment/Plan Vascular dementia without behavioral disturbance (HCC)  Diabetes mellitus type 2, diet-controlled (HCC)  PAF (paroxysmal atrial fibrillation) (HCC)  Routine general medical examination at a health care facility  Do not resuscitate  Elevated blood pressure reading Patient with history of dementia. Stable without signs of acute decline. FAST stage 4. Current code status DNR. Support with his nephew, Homero Fellers who is his HCPOA. Wife deceased. DM well-controlled with most recent A1c 7.1 03/2023. No meds. PAF rate controlled with metoprolol. No anticoagulation at this time. Elevated BP today. Continue to monitor BP typically well-controlled.   Family/ staff Communication: nursing  Labs/tests ordered:  CMP, CBC, Hgb A1c q106mo next due 09/2023; annual PSA

## 2023-09-16 ENCOUNTER — Non-Acute Institutional Stay (SKILLED_NURSING_FACILITY): Payer: Medicare Other | Admitting: Nurse Practitioner

## 2023-09-16 ENCOUNTER — Encounter: Payer: Self-pay | Admitting: Nurse Practitioner

## 2023-09-16 DIAGNOSIS — I471 Supraventricular tachycardia, unspecified: Secondary | ICD-10-CM

## 2023-09-16 DIAGNOSIS — E119 Type 2 diabetes mellitus without complications: Secondary | ICD-10-CM | POA: Diagnosis not present

## 2023-09-16 DIAGNOSIS — I480 Paroxysmal atrial fibrillation: Secondary | ICD-10-CM | POA: Diagnosis not present

## 2023-09-16 DIAGNOSIS — D649 Anemia, unspecified: Secondary | ICD-10-CM

## 2023-09-16 DIAGNOSIS — F0150 Vascular dementia without behavioral disturbance: Secondary | ICD-10-CM

## 2023-09-16 NOTE — Progress Notes (Signed)
Location:  [facility] [place].  Nursing Home Room Number: [facility] [address] Place of Service:  SNF [phone]) Abbey Chatters, NP  PCP: Earnestine Mealing, MD  Patient Care Team: Earnestine Mealing, MD as PCP - General (Family Medicine) Mariah Milling, Tollie Pizza, MD as Consulting Physician (Cardiology) Sherlene Shams, MD (Internal Medicine)  Extended Emergency Contact Information Primary Emergency Contact: Mcpeek,Janet Address: [address]          [place], [place] [address] D[place] of [place] Home Phone: [phone] Relation: Spouse Secondary Emergency Contact: Cathlean Sauer Mobile Phone: [phone] Relation: Nephew  Goals of care: Advanced Directive information    09/16/2023    9:25 AM  Advanced Directives  Does Patient Have a Medical Advance Directive? Yes  Type of Estate agent of [place];Out of facility DNR (pink MOST or yellow form);Living will  Does patient want to make changes to medical advance directive? No - Patient declined  Copy of Healthcare Power of Attorney in Chart? Yes - validated most recent copy scanned in chart (See row information)     Chief Complaint  Patient presents with   Medical Management of Chronic Issues    Medical Management of Chronic Issues.     HPI:  Pt is a 89 y.o. male seen today for medical management of chronic disease.  Pt with hx of dementia, a fib, htn, hyperlipidemia.  [facility]ues to [facility] the [place] for activities in memory care 5 days a week.  He is eating well.  He has been doing good without acute concerns from nursing.  He has no complaints.  He does not know his age or the year he was born.    Past Medical History:  Diagnosis Date   Acute gallstone pancreatitis 02/22/2021   Ascending cholangitis 02/22/2021   Atrial fibrillation (HCC)    Choledocholithiasis with acute cholecystitis with obstruction 02/22/2021   Diabetes mellitus without complication (HCC)    E coli bacteremia 02/22/2021   Fatigue     HLD (hyperlipidemia)    Hypertension    OSA (obstructive sleep apnea)    Radial fracture 2006   right, screws and plates   Sepsis due to Escherichia coli (E. coli) (HCC) 02/22/2021   SVT (supraventricular tachycardia) (HCC) 2012   a. Holter in 2012 showed 27 episodes of SVT/atrial tach w/ longest being 36 beats; b. asymptomatic   Syncope 08/2015   a. short rhythm strip in [facility] ED showed junctional escape rhythm and AV dissociation with essentially sinus bradycardia   Ulnar fracture 1995   fall off of ladder   Past Surgical History:  Procedure Laterality Date   ERCP Left 02/22/2021   Procedure: ENDOSCOPIC RETROGRADE CHOLANGIOPANCREATOGRAPHY (ERCP);  Surgeon: Vida Rigger, MD;  Location: [facility] ENDOSCOPY;  Service: Endoscopy;  Laterality: Left;   HERNIA REPAIR  Aug 2012   right indirect inguinal, with mesh   PANCREATIC STENT PLACEMENT  02/22/2021   Procedure: PANCREATIC STENT PLACEMENT;  Surgeon: Vida Rigger, MD;  Location: [facility] ENDOSCOPY;  Service: Endoscopy;;   REMOVAL OF STONES  02/22/2021   Procedure: REMOVAL OF STONES;  Surgeon: Vida Rigger, MD;  Location: [facility] ENDOSCOPY;  Service: Endoscopy;;   SPHINCTEROTOMY  02/22/2021   Procedure: Dennison Mascot;  Surgeon: Vida Rigger, MD;  Location: [facility] ENDOSCOPY;  Service: Endoscopy;;    No Known Allergies  Outpatient Encounter Medications as of 09/16/2023  Medication Sig   acetaminophen (TYLENOL) 500 MG tablet Take 1,000 mg by mouth every 4 (four) hours as needed for mild pain or fever.  carboxymethylcellulose (ARTIFICIAL TEARS) 1 % ophthalmic solution Apply 1 drop to eye every 6 (six) hours as needed (dry eyes). Or in the affected eye four times a day as needed   ipratropium-albuterol (DUONEB) 0.5-2.5 (3) MG/3ML SOLN Take 3 mLs by nebulization every 6 (six) hours as needed.   ketoconazole (NIZORAL) 2 % shampoo Apply 1 Application topically 2 (two) times a week. Tuesday and Friday   metoprolol tartrate (LOPRESSOR) 25 MG tablet Take 0.5 tablets (12.5 mg  total) by mouth 2 (two) times daily.   Multiple Vitamins-Minerals (MENS 50+ MULTI VITAMIN/MIN PO) Take 1 tablet by mouth daily.   Sodium Fluoride (PREVIDENT 5000 BOOSTER PLUS) 1.1 % PSTE Place 1 Application onto teeth daily.   vitamin B-12 (CYANOCOBALAMIN) 1000 MCG tablet Take 1,000 mcg by mouth daily.   No facility-administered encounter medications on file as of 09/16/2023.    Review of Systems  Constitutional:  Negative for activity change, appetite change, fatigue and unexpected weight change.  HENT:  Negative for congestion and hearing loss.   Eyes: Negative.   Respiratory:  Negative for cough and shortness of breath.   Cardiovascular:  Negative for chest pain, palpitations and leg swelling.  Gastrointestinal:  Negative for abdominal pain, constipation and diarrhea.  Genitourinary:  Negative for difficulty urinating and dysuria.  Musculoskeletal:  Negative for arthralgias and myalgias.  Skin:  Negative for color change and wound.  Neurological:  Negative for dizziness and weakness.  Psychiatric/Behavioral:  Positive for confusion. Negative for agitation and behavioral problems.      Immunization History  Administered Date(s) Administered   Influenza, High Dose Seasonal PF 08/25/2022   Influenza, Seasonal, Injecte, Preservative Fre 09/08/2015, 09/03/2016   Influenza-Unspecified 09/12/2013, 08/18/2014, 09/01/2023   Moderna Covid-19 Fall Seasonal Vaccine 57yrs & older 02/16/2023   Moderna Covid-19 Vaccine Bivalent Booster 8yrs & up 08/01/2021, 04/07/2022   Moderna SARS-COV2 Booster Vaccination 09/20/2020, 03/28/2021   Moderna Sars-Covid-2 Vaccination 11/20/2019, 12/18/2019, 08/06/2023   PPD Test 04/29/2016   Pneumococcal Conjugate-13 01/23/2015   Pneumococcal Polysaccharide-23 01/20/2012, 06/22/2013   Tdap 08/21/2013   Zoster Recombinant(Shingrix) 08/02/2006, 05/05/2023   Zoster, Live 08/21/2013   Pertinent  Health Maintenance Due  Topic Date Due   OPHTHALMOLOGY EXAM   08/28/2023   HEMOGLOBIN A1C  09/10/2023   FOOT EXAM  03/15/2024   INFLUENZA VACCINE  Completed      02/26/2021    7:20 PM 02/27/2021    7:57 AM 02/27/2021    8:00 PM 02/28/2021    8:00 AM 05/04/2023    2:54 PM  Fall Risk  Falls in the past year?     0  (RETIRED) Patient Fall Risk Level High fall risk High fall risk High fall risk High fall risk    Functional Status Survey:    Vitals:   09/16/23 0920  BP: 131/76  Pulse: 81  Resp: 18  Temp: 97.9 F (36.6 C)  SpO2: 98%  Weight: 159 lb 6.4 oz (72.3 kg)  Height: 5\' 8"  (1.727 m)   Body mass index is 24.24 kg/m. Wt Readings from Last 3 Encounters:  09/16/23 159 lb 6.4 oz (72.3 kg)  07/19/23 159 lb (72.1 kg)  06/21/23 157 lb 3.2 oz (71.3 kg)    Physical Exam Constitutional:      General: He is not in acute distress.    Appearance: He is well-developed. He is not diaphoretic.  HENT:     Head: Normocephalic and atraumatic.     Right Ear: External ear normal.  Left Ear: External ear normal.     Mouth/Throat:     Pharynx: No oropharyngeal exudate.  Eyes:     Conjunctiva/sclera: Conjunctivae normal.     Pupils: Pupils are equal, round, and reactive to light.  Cardiovascular:     Rate and Rhythm: Normal rate and regular rhythm.     Heart sounds: Normal heart sounds.  Pulmonary:     Effort: Pulmonary effort is normal.     Breath sounds: Normal breath sounds.  Abdominal:     General: Bowel sounds are normal.     Palpations: Abdomen is soft.  Musculoskeletal:        General: No tenderness.     Cervical back: Normal range of motion and neck supple.     Right lower leg: No edema.     Left lower leg: No edema.  Skin:    General: Skin is warm and dry.  Neurological:     Mental Status: He is alert and oriented to person, place, and time.     Labs reviewed: Recent Labs    03/10/23 0000  NA 142  K 3.8  CL 104  CO2 29*  BUN 11  CREATININE 0.7  CALCIUM 8.7   Recent Labs    03/10/23 0000  AST 14  ALT 11   ALKPHOS 81  ALBUMIN 3.5   Recent Labs    03/10/23 0000  WBC 4.4  NEUTROABS 1,954.00  HGB 12.1*  HCT 38*  PLT 218   Lab Results  Component Value Date   TSH 0.359 02/23/2021   Lab Results  Component Value Date   HGBA1C 7.1 03/10/2023   Lab Results  Component Value Date   CHOL 154 02/24/2021   HDL 44 02/24/2021   LDLCALC 92 02/24/2021   LDLDIRECT 124.0 01/04/2017   TRIG 91 02/24/2021   CHOLHDL 3.5 02/24/2021    Significant Diagnostic Results in last 30 days:  No results found.  Assessment/Plan 1. Diabetes mellitus type 2, diet-controlled (HCC) Diet controlled Will follow up A1c  2. PAF (paroxysmal atrial fibrillation) (HCC) Rate controlled on metoprolol.   3. Vascular dementia without behavioral disturbance (HCC) -Stable, no acute changes in cognitive or functional status, continue supportive care.   4. SVT (supraventricular tachycardia) (HCC) Rate controlled on metoprolol   5. Anemia, unspecified type Will follow up cbc   Vanessia Bokhari K. Biagio Borg [facility] [phone]

## 2023-09-20 LAB — COMPREHENSIVE METABOLIC PANEL
Albumin: 3.8 (ref 3.5–5.0)
Calcium: 8.8 (ref 8.7–10.7)
Globulin: 2.4
eGFR: 85

## 2023-09-20 LAB — HEPATIC FUNCTION PANEL
ALT: 12 U/L (ref 10–40)
AST: 18 (ref 14–40)
Alkaline Phosphatase: 74 (ref 25–125)
Bilirubin, Total: 0.9

## 2023-09-20 LAB — BASIC METABOLIC PANEL
BUN: 14 (ref 4–21)
CO2: 30 — AB (ref 13–22)
Chloride: 104 (ref 99–108)
Creatinine: 0.7 (ref 0.6–1.3)
Glucose: 104
Potassium: 4.1 meq/L (ref 3.5–5.1)
Sodium: 138 (ref 137–147)

## 2023-09-20 LAB — CBC AND DIFFERENTIAL
HCT: 40 — AB (ref 41–53)
Hemoglobin: 13.1 — AB (ref 13.5–17.5)
Neutrophils Absolute: 2581
Platelets: 213 10*3/uL (ref 150–400)
WBC: 5.3

## 2023-09-20 LAB — CBC: RBC: 4.46 (ref 3.87–5.11)

## 2023-09-20 LAB — HEMOGLOBIN A1C: Hemoglobin A1C: 7.5

## 2023-11-17 ENCOUNTER — Encounter: Payer: Self-pay | Admitting: Student

## 2023-11-17 ENCOUNTER — Non-Acute Institutional Stay (SKILLED_NURSING_FACILITY): Payer: Medicare Other | Admitting: Student

## 2023-11-17 DIAGNOSIS — I480 Paroxysmal atrial fibrillation: Secondary | ICD-10-CM | POA: Diagnosis not present

## 2023-11-17 DIAGNOSIS — E119 Type 2 diabetes mellitus without complications: Secondary | ICD-10-CM

## 2023-11-17 DIAGNOSIS — D649 Anemia, unspecified: Secondary | ICD-10-CM

## 2023-11-17 DIAGNOSIS — F0150 Vascular dementia without behavioral disturbance: Secondary | ICD-10-CM

## 2023-11-17 NOTE — Progress Notes (Signed)
 Location:  Other [facility].  Nursing Home Room Number: [facility] [address] Place of Service:  SNF [phone]) Provider:  Abdul Fine, MD  Patient Care Team: Abdul Fine, MD as PCP - General (Family Medicine) Perla, Evalene PARAS, MD as Consulting Physician (Cardiology) Marylynn Verneita CROME, MD (Internal Medicine)  Extended Emergency Contact Information Primary Emergency Contact: Tang,Janet Address: [address]          [place], [place] [address] [place] Home Phone: [phone] Relation: Spouse Secondary Emergency Contact: Geradine Lerner Mobile Phone: [phone] Relation: Nephew  Code Status:  DNR Goals of care: Advanced Directive information    11/17/2023    8:42 AM  Advanced Directives  Does Patient Have a Medical Advance Directive? Yes  Type of Estate Agent of [place];Out of facility DNR (pink MOST or yellow form);Living will  Does patient want to make changes to medical advance directive? No - Patient declined  Copy of Healthcare Power of Attorney in Chart? Yes - validated most recent copy scanned in chart (See row information)     Chief Complaint  Patient presents with   Medical Management of Chronic Issues    Medical Management of Chronic Issues.     HPI:  Pt is a 89 y.o. male seen today for medical management of chronic diseases.  The patient, in his nineties, presents for a routine check-up. He reports feeling 'great' with no complaints. He has been sleeping and eating well, having regular bowel movements, and urinating without difficulty. He denies any pain. The patient is cognizant of his age and birth date, but had a minor lapse in the current month.  He goes to the adult day center faithfully.   Nursing without concerns at this time.    Past Medical History:  Diagnosis Date   Acute gallstone pancreatitis 02/22/2021   Ascending cholangitis 02/22/2021   Atrial fibrillation (HCC)    Choledocholithiasis with acute  cholecystitis with obstruction 02/22/2021   Diabetes mellitus without complication (HCC)    E coli bacteremia 02/22/2021   Fatigue    HLD (hyperlipidemia)    Hypertension    OSA (obstructive sleep apnea)    Radial fracture 2006   right, screws and plates   Sepsis due to Escherichia coli (E. coli) (HCC) 02/22/2021   SVT (supraventricular tachycardia) (HCC) 2012   a. Holter in 2012 showed 27 episodes of SVT/atrial tach w/ longest being 36 beats; b. asymptomatic   Syncope 08/2015   a. short rhythm strip in [facility] ED showed junctional escape rhythm and AV dissociation with essentially sinus bradycardia   Ulnar fracture 1995   fall off of ladder   Past Surgical History:  Procedure Laterality Date   ERCP Left 02/22/2021   Procedure: ENDOSCOPIC RETROGRADE CHOLANGIOPANCREATOGRAPHY (ERCP);  Surgeon: Rosalie Kitchens, MD;  Location: [facility] ENDOSCOPY;  Service: Endoscopy;  Laterality: Left;   HERNIA REPAIR  Aug 2012   right indirect inguinal, with mesh   PANCREATIC STENT PLACEMENT  02/22/2021   Procedure: PANCREATIC STENT PLACEMENT;  Surgeon: Rosalie Kitchens, MD;  Location: [facility] ENDOSCOPY;  Service: Endoscopy;;   REMOVAL OF STONES  02/22/2021   Procedure: REMOVAL OF STONES;  Surgeon: Rosalie Kitchens, MD;  Location: [facility] ENDOSCOPY;  Service: Endoscopy;;   SPHINCTEROTOMY  02/22/2021   Procedure: ANNETT;  Surgeon: Rosalie Kitchens, MD;  Location: [facility] ENDOSCOPY;  Service: Endoscopy;;    No Known Allergies  Outpatient Encounter Medications as of 11/17/2023  Medication Sig   acetaminophen  (TYLENOL ) 500 MG tablet Take 1,000 mg by  mouth every 4 (four) hours as needed for mild pain or fever.   carboxymethylcellulose (ARTIFICIAL TEARS) 1 % ophthalmic solution Apply 1 drop to eye every 6 (six) hours as needed (dry eyes). Or in the affected eye four times a day as needed   ipratropium-albuterol  (DUONEB) 0.5-2.5 (3) MG/3ML SOLN Take 3 mLs by nebulization every 6 (six) hours as needed.   ketoconazole (NIZORAL) 2 % shampoo Apply 1  Application topically 2 (two) times a week. Tuesday and Friday   metoprolol  tartrate (LOPRESSOR ) 25 MG tablet Take 0.5 tablets (12.5 mg total) by mouth 2 (two) times daily.   Multiple Vitamins-Minerals (MENS 50+ MULTI VITAMIN/MIN PO) Take 1 tablet by mouth daily.   Sodium Fluoride (PREVIDENT 5000 BOOSTER PLUS) 1.1 % PSTE Place 1 Application onto teeth daily.   vitamin B-12 (CYANOCOBALAMIN ) 1000 MCG tablet Take 1,000 mcg by mouth daily.   No facility-administered encounter medications on file as of 11/17/2023.    Review of Systems  Immunization History  Administered Date(s) Administered   Influenza, High Dose Seasonal PF 08/25/2022   Influenza, Seasonal, Injecte, Preservative Fre 09/08/2015, 09/03/2016   Influenza-Unspecified 09/12/2013, 08/18/2014, 09/01/2023   Moderna Covid-19 Fall Seasonal Vaccine 58yrs & older 02/16/2023   Moderna Covid-19 Vaccine Bivalent Booster 17yrs & up 08/01/2021, 04/07/2022   Moderna SARS-COV2 Booster Vaccination 09/20/2020, 03/28/2021   Moderna Sars-Covid-2 Vaccination 11/20/2019, 12/18/2019, 08/06/2023   PPD Test 04/29/2016   Pneumococcal Conjugate-13 01/23/2015   Pneumococcal Polysaccharide-23 01/20/2012, 06/22/2013   Tdap 08/21/2013   Zoster Recombinant(Shingrix) 08/02/2006, 05/05/2023   Zoster, Live 08/21/2013   Pertinent  Health Maintenance Due  Topic Date Due   OPHTHALMOLOGY EXAM  08/28/2023   FOOT EXAM  03/15/2024   HEMOGLOBIN A1C  03/19/2024   INFLUENZA VACCINE  Completed      02/26/2021    7:20 PM 02/27/2021    7:57 AM 02/27/2021    8:00 PM 02/28/2021    8:00 AM 05/04/2023    2:54 PM  Fall Risk  Falls in the past year?     0  (RETIRED) Patient Fall Risk Level High fall risk High fall risk High fall risk High fall risk    Functional Status Survey:    Vitals:   11/17/23 0839  BP: 129/74  Pulse: 81  Resp: 18  Temp: 97.9 F (36.6 C)  SpO2: 98%  Weight: 158 lb 12.8 oz (72 kg)  Height: 5' 8 (1.727 m)   Body mass index is 24.15  kg/m. Physical Exam Constitutional:      Appearance: Normal appearance.  Cardiovascular:     Rate and Rhythm: Normal rate. Rhythm irregular.     Pulses: Normal pulses.  Pulmonary:     Effort: Pulmonary effort is normal.  Abdominal:     General: Abdomen is flat. Bowel sounds are normal.     Palpations: Abdomen is soft.  Musculoskeletal:        General: No swelling or tenderness.  Skin:    General: Skin is warm and dry.  Neurological:     Mental Status: He is alert. Mental status is at baseline. He is disoriented.     Gait: Gait normal.  Psychiatric:        Mood and Affect: Mood normal.     Labs reviewed: Recent Labs    03/10/23 0000 09/20/23 0000  NA 142 138  K 3.8 4.1  CL 104 104  CO2 29* 30*  BUN 11 14  CREATININE 0.7 0.7  CALCIUM  8.7 8.8   Recent Labs  03/10/23 0000 09/20/23 0000  AST 14 18  ALT 11 12  ALKPHOS 81 74  ALBUMIN 3.5 3.8   Recent Labs    03/10/23 0000 09/20/23 0000  WBC 4.4 5.3  NEUTROABS 1,954.00 2,581.00  HGB 12.1* 13.1*  HCT 38* 40*  PLT 218 213   Lab Results  Component Value Date   TSH 0.359 02/23/2021   Lab Results  Component Value Date   HGBA1C 7.5 09/20/2023   Lab Results  Component Value Date   CHOL 154 02/24/2021   HDL 44 02/24/2021   LDLCALC 92 02/24/2021   LDLDIRECT 124.0 01/04/2017   TRIG 91 02/24/2021   CHOLHDL 3.5 02/24/2021    Significant Diagnostic Results in last 30 days:  No results found.  Assessment/Plan PAF (paroxysmal atrial fibrillation) (HCC)  Diabetes mellitus type 2, diet-controlled (HCC)  Vascular dementia without behavioral disturbance (HCC)  Anemia, unspecified type Rate well-controlled. No anticoagulation. DM diet controlled. Most recent hemaglobin with an improved anemia. Memory deficits stable at this time without acute behavioral concerns. Continue supportive care.    Family/ staff Communication: nursing  Labs/tests ordered:  none

## 2023-12-09 ENCOUNTER — Encounter: Payer: Self-pay | Admitting: Nurse Practitioner

## 2023-12-09 ENCOUNTER — Non-Acute Institutional Stay (SKILLED_NURSING_FACILITY): Payer: Self-pay | Admitting: Nurse Practitioner

## 2023-12-09 DIAGNOSIS — L602 Onychogryphosis: Secondary | ICD-10-CM | POA: Diagnosis not present

## 2023-12-09 DIAGNOSIS — L853 Xerosis cutis: Secondary | ICD-10-CM | POA: Diagnosis not present

## 2023-12-09 NOTE — Progress Notes (Signed)
Location:  Other [place].  Nursing Home Room Number: [facility] [address] Place of Service:  SNF [phone]) Abbey Chatters, NP  PCP: Earnestine Mealing, MD  Patient Care Team: Earnestine Mealing, MD as PCP - General (Family Medicine) Mariah Milling, Tollie Pizza, MD as Consulting Physician (Cardiology) Sherlene Shams, MD (Internal Medicine)  Extended Emergency Contact Information Primary Emergency Contact: Birks,Janet Address: [address]          [place], [place] [address] Darden Amber of [place] Home Phone: [phone] Relation: Spouse Secondary Emergency Contact: Cathlean Sauer Mobile Phone: [phone] Relation: Nephew  Goals of care: Advanced Directive information    12/09/2023    9:54 AM  Advanced Directives  Does Patient Have a Medical Advance Directive? Yes  Type of Estate agent of [place];Out of facility DNR (pink MOST or yellow form);Living will  Does patient want to make changes to medical advance directive? No - Patient declined  Copy of Healthcare Power of Attorney in Chart? Yes - validated most recent copy scanned in chart (See row information)     Chief Complaint  Patient presents with   Acute Visit    Overgrown Toenails.     HPI:  Pt is a 89 y.o. male seen today for an acute visit for Overgrown Toenails.  Nursing request visit due to black area on bottom of right foot.  Pt denies pain or discomfort with walking or when wearing shoes.  Staff has a hard time getting patient to do personal care [facility] which [facility] his [facility]  Noted to have black lint with dry skin noted to bottom of foot Lot of dead skin flakes and overgrown toenails bilaterally.  No skin breakdown noted.    Past Medical History:  Diagnosis Date   Acute gallstone pancreatitis 02/22/2021   Ascending cholangitis 02/22/2021   Atrial fibrillation (HCC)    Choledocholithiasis with acute cholecystitis with obstruction 02/22/2021   Diabetes mellitus  without complication (HCC)    E coli bacteremia 02/22/2021   Fatigue    HLD (hyperlipidemia)    Hypertension    OSA (obstructive sleep apnea)    Radial fracture 2006   right, screws and plates   Sepsis due to Escherichia coli (E. coli) (HCC) 02/22/2021   SVT (supraventricular tachycardia) (HCC) 2012   a. Holter in 2012 showed 27 episodes of SVT/atrial tach w/ longest being 36 beats; b. asymptomatic   Syncope 08/2015   a. short rhythm strip in [facility] ED showed junctional escape rhythm and AV dissociation with essentially sinus bradycardia   Ulnar fracture 1995   fall off of ladder   Past Surgical History:  Procedure Laterality Date   ERCP Left 02/22/2021   Procedure: ENDOSCOPIC RETROGRADE CHOLANGIOPANCREATOGRAPHY (ERCP);  Surgeon: Vida Rigger, MD;  Location: [facility] ENDOSCOPY;  Service: Endoscopy;  Laterality: Left;   HERNIA REPAIR  Aug 2012   right indirect inguinal, with mesh   PANCREATIC STENT PLACEMENT  02/22/2021   Procedure: PANCREATIC STENT PLACEMENT;  Surgeon: Vida Rigger, MD;  Location: [facility] ENDOSCOPY;  Service: Endoscopy;;   REMOVAL OF STONES  02/22/2021   Procedure: REMOVAL OF STONES;  Surgeon: Vida Rigger, MD;  Location: [facility] ENDOSCOPY;  Service: Endoscopy;;   SPHINCTEROTOMY  02/22/2021   Procedure: Dennison Mascot;  Surgeon: Vida Rigger, MD;  Location: [facility] ENDOSCOPY;  Service: Endoscopy;;    No Known Allergies  Outpatient Encounter Medications as of 12/09/2023  Medication Sig   acetaminophen (TYLENOL) 500 MG tablet Take 1,000 mg by mouth every  4 (four) hours as needed for mild pain or fever.   carboxymethylcellulose (ARTIFICIAL TEARS) 1 % ophthalmic solution Apply 1 drop to eye every 6 (six) hours as needed (dry eyes). Or in the affected eye four times a day as needed   ipratropium-albuterol (DUONEB) 0.5-2.5 (3) MG/3ML SOLN Take 3 mLs by nebulization every 6 (six) hours as needed.   ketoconazole (NIZORAL) 2 % shampoo Apply 1 Application topically 2 (two) times a week. Tuesday and  Friday   metoprolol tartrate (LOPRESSOR) 25 MG tablet Take 0.5 tablets (12.5 mg total) by mouth 2 (two) times daily.   Multiple Vitamins-Minerals (MENS 50+ MULTI VITAMIN/MIN PO) Take 1 tablet by mouth daily.   Sodium Fluoride (PREVIDENT 5000 BOOSTER PLUS) 1.1 % PSTE Place 1 Application onto teeth daily.   vitamin B-12 (CYANOCOBALAMIN) 1000 MCG tablet Take 1,000 mcg by mouth daily.   No facility-administered encounter medications on file as of 12/09/2023.    Review of Systems  Constitutional:  Negative for activity change and appetite change.  Musculoskeletal:  Negative for arthralgias.  Skin:  Negative for color change, pallor, rash and wound.    Immunization History  Administered Date(s) Administered   Influenza, High Dose Seasonal PF 08/25/2022   Influenza, Seasonal, Injecte, Preservative Fre 09/08/2015, 09/03/2016   Influenza-Unspecified 09/12/2013, 08/18/2014, 09/01/2023   Moderna Covid-19 Fall Seasonal Vaccine 37yrs & older 02/16/2023   Moderna Covid-19 Vaccine Bivalent Booster 13yrs & up 08/01/2021, 04/07/2022   Moderna SARS-COV2 Booster Vaccination 09/20/2020, 03/28/2021   Moderna Sars-Covid-2 Vaccination 11/20/2019, 12/18/2019, 08/06/2023   PPD Test 04/29/2016   Pneumococcal Conjugate-13 01/23/2015   Pneumococcal Polysaccharide-23 01/20/2012, 06/22/2013   Tdap 08/21/2013   Zoster Recombinant(Shingrix) 08/02/2006, 05/05/2023   Zoster, Live 08/21/2013   Pertinent  Health Maintenance Due  Topic Date Due   OPHTHALMOLOGY EXAM  08/28/2023   FOOT EXAM  03/15/2024   HEMOGLOBIN A1C  03/19/2024   INFLUENZA VACCINE  Completed      02/26/2021    7:20 PM 02/27/2021    7:57 AM 02/27/2021    8:00 PM 02/28/2021    8:00 AM 05/04/2023    2:54 PM  Fall Risk  Falls in the past year?     0  (RETIRED) Patient Fall Risk Level High fall risk High fall risk High fall risk High fall risk    Functional Status Survey:    Vitals:   12/09/23 0951 12/09/23 0956  BP: (!) 155/76 (!) 148/70   Pulse: 78   Resp: 18   Temp: 97.9 F (36.6 C)   SpO2: 97%   Weight: 159 lb (72.1 kg)   Height: 5\' 8"  (1.727 m)    Body mass index is 24.18 kg/m. Physical Exam Constitutional:      Appearance: Normal appearance.  Feet:     Right foot:     Skin integrity: Dry skin present.     Toenail Condition: Right toenails are abnormally thick and long. Fungal disease present.    Left foot:     Skin integrity: Dry skin present.     Toenail Condition: Left toenails are abnormally thick and long. Fungal disease present. Neurological:     Mental Status: He is alert. Mental status is at baseline.  Psychiatric:        Mood and Affect: Mood normal.     Labs reviewed: Recent Labs    03/10/23 0000 09/20/23 0000  NA 142 138  K 3.8 4.1  CL 104 104  CO2 29* 30*  BUN 11 14  CREATININE  0.7 0.7  CALCIUM 8.7 8.8   Recent Labs    03/10/23 0000 09/20/23 0000  AST 14 18  ALT 11 12  ALKPHOS 81 74  ALBUMIN 3.5 3.8   Recent Labs    03/10/23 0000 09/20/23 0000  WBC 4.4 5.3  NEUTROABS 1,954.00 2,581.00  HGB 12.1* 13.1*  HCT 38* 40*  PLT 218 213   Lab Results  Component Value Date   TSH 0.359 02/23/2021   Lab Results  Component Value Date   HGBA1C 7.5 09/20/2023   Lab Results  Component Value Date   CHOL 154 02/24/2021   HDL 44 02/24/2021   LDLCALC 92 02/24/2021   LDLDIRECT 124.0 01/04/2017   TRIG 91 02/24/2021   CHOLHDL 3.5 02/24/2021    Significant Diagnostic Results in last 30 days:  No results found.  Assessment/Plan 1. Overgrown toenails (Primary) --Consistent provided for toe nails to be trimmed, Nails sharply debrided/dremel used 10 without complication/bleeding  2. Dry skin Ordered routine skin care to feet daily for staff    Lavontay Kirk K. Biagio Borg [facility] [phone]    Total time 30:  time greater than 50% of total time spent doing pt counseling and coordination of care and working with bilateral feet due to  toenails/dry skin

## 2024-01-25 ENCOUNTER — Encounter: Payer: Self-pay | Admitting: Nurse Practitioner

## 2024-01-25 ENCOUNTER — Non-Acute Institutional Stay (SKILLED_NURSING_FACILITY): Payer: Self-pay | Admitting: Nurse Practitioner

## 2024-01-25 DIAGNOSIS — I480 Paroxysmal atrial fibrillation: Secondary | ICD-10-CM

## 2024-01-25 DIAGNOSIS — D649 Anemia, unspecified: Secondary | ICD-10-CM | POA: Insufficient documentation

## 2024-01-25 DIAGNOSIS — F0150 Vascular dementia without behavioral disturbance: Secondary | ICD-10-CM

## 2024-01-25 DIAGNOSIS — E119 Type 2 diabetes mellitus without complications: Secondary | ICD-10-CM

## 2024-01-25 DIAGNOSIS — L602 Onychogryphosis: Secondary | ICD-10-CM | POA: Insufficient documentation

## 2024-01-25 DIAGNOSIS — I471 Supraventricular tachycardia, unspecified: Secondary | ICD-10-CM

## 2024-01-25 NOTE — Assessment & Plan Note (Signed)
 Stable, continue to monitor hgb.

## 2024-01-25 NOTE — Assessment & Plan Note (Signed)
 Slow progressive decline, continues with his routine. Continue with supportive care.

## 2024-01-25 NOTE — Assessment & Plan Note (Signed)
 Continue with routine podiatry care.

## 2024-01-25 NOTE — Assessment & Plan Note (Signed)
Rate controlled on metoprolol.  °

## 2024-01-25 NOTE — Assessment & Plan Note (Signed)
 Encouraged dietary compliance, routine foot care/monitoring and to keep up with diabetic eye exams through ophthalmology  -continue to monitor A1c every 6 months.

## 2024-01-25 NOTE — Progress Notes (Signed)
 Location:  Other Nursing Home Room Number: 512 A Place of Service:  SNF (31)  Earnestine Mealing, MD  Patient Care Team: Earnestine Mealing, MD as PCP - General (Family Medicine) Mariah Milling, Tollie Pizza, MD as Consulting Physician (Cardiology) Sherlene Shams, MD (Internal Medicine)  Extended Emergency Contact Information Primary Emergency Contact: Birt,Janet Address: [address]          [place], [place] [address] Darden Amber of [place] Home Phone: [phone] Relation: Spouse Secondary Emergency Contact: Cathlean Sauer Mobile Phone: [phone] Relation: Nephew  Goals of care: Advanced Directive information    12/09/2023    9:54 AM  Advanced Directives  Does Patient Have a Medical Advance Directive? Yes  Type of Estate agent of [place];Out of facility DNR (pink MOST or yellow form);Living will  Does patient want to make changes to medical advance directive? No - Patient declined  Copy of Healthcare Power of Attorney in Chart? Yes - validated most recent copy scanned in chart (See row information)     Chief Complaint  Patient presents with   Medical Management of Chronic Issues    Routine visit. Discuss need for td/tdap, eye exam (alert onsite eye doctors that patient needs a diabetic eye exam) and additional covid boosters.     HPI:  Pt is a 89 y.o. male seen today for medical management of chronic disease.  Pt with hx of dementia, diabetes, SVT, and anemia He continues to go to the adult daycare program daily during the week.  His memory continues to slowly decline. He is assisted by staff with ADLs.  He was seen on 1/30 due to overgrown toenails and now on the podiatry list for routine nail care.  He continues to walk with the rollator.   Pt has no complaints at this time and staff has no concerns at this time.    Past Medical History:  Diagnosis Date   Acute gallstone pancreatitis 02/22/2021   Ascending cholangitis 02/22/2021   Atrial  fibrillation (HCC)    Choledocholithiasis with acute cholecystitis with obstruction 02/22/2021   Diabetes mellitus without complication (HCC)    E coli bacteremia 02/22/2021   Fatigue    HLD (hyperlipidemia)    Hypertension    OSA (obstructive sleep apnea)    Radial fracture 2006   right, screws and plates   Sepsis due to Escherichia coli (E. coli) (HCC) 02/22/2021   SVT (supraventricular tachycardia) (HCC) 2012   a. Holter in 2012 showed 27 episodes of SVT/atrial tach w/ longest being 36 beats; b. asymptomatic   Syncope 08/2015   a. short rhythm strip in [facility] ED showed junctional escape rhythm and AV dissociation with essentially sinus bradycardia   Ulnar fracture 1995   fall off of ladder   Past Surgical History:  Procedure Laterality Date   ERCP Left 02/22/2021   Procedure: ENDOSCOPIC RETROGRADE CHOLANGIOPANCREATOGRAPHY (ERCP);  Surgeon: Vida Rigger, MD;  Location[facility] ENDOSCOPY;  Service: Endoscopy;  Laterality: Left;   HERNIA REPAIR  Aug 2012   right indirect inguinal, with mesh   PANCREATIC STENT PLACEMENT  02/22/2021   Procedure: PANCREATIC STENT PLACEMENT;  Surgeon: Vida Rigger, MD;  Location: [facility] ENDOSCOPY;  Service: Endoscopy;;   REMOVAL OF STONES  02/22/2021   Procedure: REMOVAL OF STONES;  Surgeon: Vida Rigger, MD;  Location: [facility] ENDOSCOPY;  Service: Endoscopy;;   SPHINCTEROTOMY  02/22/2021   Procedure: Dennison Mascot;  Surgeon: Vida Rigger, MD;  Location: [facility] ENDOSCOPY;  Service: Endoscopy;;    No Known Allergies  Outpatient Encounter  Medications as of 01/25/2024  Medication Sig   acetaminophen (TYLENOL) 500 MG tablet Take 1,000 mg by mouth every 4 (four) hours as needed for mild pain or fever.   carboxymethylcellulose (ARTIFICIAL TEARS) 1 % ophthalmic solution Apply 1 drop to eye every 6 (six) hours as needed (dry eyes). Or in the affected eye four times a day as needed   ipratropium-albuterol (DUONEB) 0.5-2.5 (3) MG/3ML SOLN Take 3 mLs by nebulization every 6 (six) hours  as needed.   ketoconazole (NIZORAL) 2 % shampoo Apply 1 Application topically 2 (two) times a week. Tuesday and Friday   metoprolol tartrate (LOPRESSOR) 25 MG tablet Take 0.5 tablets (12.5 mg total) by mouth 2 (two) times daily.   Multiple Vitamins-Minerals (MENS 50+ MULTI VITAMIN/MIN PO) Take 1 tablet by mouth daily.   Sodium Fluoride (PREVIDENT 5000 BOOSTER PLUS) 1.1 % PSTE Place 1 Application onto teeth daily.   vitamin B-12 (CYANOCOBALAMIN) 1000 MCG tablet Take 1,000 mcg by mouth daily.   No facility-administered encounter medications on file as of 01/25/2024.    Review of Systems  Unable to perform ROS: Dementia     Immunization History  Administered Date(s) Administered   Influenza, High Dose Seasonal PF 08/25/2022   Influenza, Seasonal, Injecte, Preservative Fre 09/08/2015, 09/03/2016   Influenza-Unspecified 09/12/2013, 08/18/2014, 09/01/2023   Moderna Covid-19 Fall Seasonal Vaccine 48yrs & older 02/16/2023   Moderna Covid-19 Vaccine Bivalent Booster 63yrs & up 08/01/2021, 04/07/2022   Moderna SARS-COV2 Booster Vaccination 09/20/2020, 03/28/2021   Moderna Sars-Covid-2 Vaccination 11/20/2019, 12/18/2019, 08/06/2023   PPD Test 04/29/2016   Pneumococcal Conjugate-13 01/23/2015   Pneumococcal Polysaccharide-23 01/20/2012, 06/22/2013   Tdap 08/21/2013   Zoster Recombinant(Shingrix) 08/02/2006, 05/05/2023   Zoster, Live 08/21/2013   Pertinent  Health Maintenance Due  Topic Date Due   OPHTHALMOLOGY EXAM  08/28/2023   FOOT EXAM  03/15/2024   HEMOGLOBIN A1C  03/19/2024   INFLUENZA VACCINE  Completed      02/26/2021    7:20 PM 02/27/2021    7:57 AM 02/27/2021    8:00 PM 02/28/2021    8:00 AM 05/04/2023    2:54 PM  Fall Risk  Falls in the past year?     0  (RETIRED) Patient Fall Risk Level High fall risk High fall risk High fall risk High fall risk    Functional Status Survey:    Vitals:   01/25/24 1113 01/25/24 1117  BP: (!) 150/72 (!) 148/71  Pulse: 80   Weight: 161 lb  (73 kg)   Height: 5\' 8"  (1.727 m)    Body mass index is 24.48 kg/m. Physical Exam Constitutional:      General: He is not in acute distress.    Appearance: He is well-developed. He is not diaphoretic.  HENT:     Head: Normocephalic and atraumatic.     Right Ear: External ear normal.     Left Ear: External ear normal.     Mouth/Throat:     Pharynx: No oropharyngeal exudate.  Eyes:     Conjunctiva/sclera: Conjunctivae normal.     Pupils: Pupils are equal, round, and reactive to light.  Cardiovascular:     Rate and Rhythm: Normal rate and regular rhythm.     Heart sounds: Normal heart sounds.  Pulmonary:     Effort: Pulmonary effort is normal.     Breath sounds: Normal breath sounds.  Abdominal:     General: Bowel sounds are normal.     Palpations: Abdomen is soft.  Musculoskeletal:  General: No tenderness.     Cervical back: Normal range of motion and neck supple.     Right lower leg: No edema.     Left lower leg: No edema.  Skin:    General: Skin is warm and dry.  Neurological:     Mental Status: He is alert. He is disoriented.     Motor: Weakness present.     Gait: Gait abnormal.     Labs reviewed: Recent Labs    03/10/23 0000 09/20/23 0000  NA 142 138  K 3.8 4.1  CL 104 104  CO2 29* 30*  BUN 11 14  CREATININE 0.7 0.7  CALCIUM 8.7 8.8   Recent Labs    03/10/23 0000 09/20/23 0000  AST 14 18  ALT 11 12  ALKPHOS 81 74  ALBUMIN 3.5 3.8   Recent Labs    03/10/23 0000 09/20/23 0000  WBC 4.4 5.3  NEUTROABS 1,954.00 2,581.00  HGB 12.1* 13.1*  HCT 38* 40*  PLT 218 213   Lab Results  Component Value Date   TSH 0.359 02/23/2021   Lab Results  Component Value Date   HGBA1C 7.5 09/20/2023   Lab Results  Component Value Date   CHOL 154 02/24/2021   HDL 44 02/24/2021   LDLCALC 92 02/24/2021   LDLDIRECT 124.0 01/04/2017   TRIG 91 02/24/2021   CHOLHDL 3.5 02/24/2021    Significant Diagnostic Results in last 30 days:  No results  found.  Assessment/Plan Diabetes mellitus type 2, diet-controlled (HCC) Encouraged dietary compliance, routine foot care/monitoring and to keep up with diabetic eye exams through ophthalmology  -continue to monitor A1c every 6 months.   Vascular dementia without behavioral disturbance (HCC) Slow progressive decline, continues with his routine. Continue with supportive care.  PAF (paroxysmal atrial fibrillation) (HCC) Rate controlled on metoprolol   Anemia Stable, continue to monitor hgb.   Overgrown toenails Continue with routine podiatry care.   SVT (supraventricular tachycardia) Rate controlled on metoprolol.      Janene Harvey. Biagio Borg [facility] [phone]

## 2024-03-09 LAB — BASIC METABOLIC PANEL WITH GFR
BUN: 16 (ref 4–21)
CO2: 28 — AB (ref 13–22)
Chloride: 105 (ref 99–108)
Creatinine: 0.8 (ref 0.6–1.3)
Glucose: 109
Potassium: 3.9 meq/L (ref 3.5–5.1)
Sodium: 141 (ref 137–147)

## 2024-03-09 LAB — HEMOGLOBIN A1C: Hemoglobin A1C: 7.5

## 2024-03-09 LAB — HEPATIC FUNCTION PANEL
ALT: 12 U/L (ref 10–40)
AST: 16 (ref 14–40)
Alkaline Phosphatase: 84 (ref 25–125)
Bilirubin, Total: 0.5

## 2024-03-09 LAB — COMPREHENSIVE METABOLIC PANEL WITH GFR
Albumin: 3.6 (ref 3.5–5.0)
Calcium: 8.8 (ref 8.7–10.7)
Globulin: 2.1
eGFR: 82

## 2024-03-09 LAB — CBC AND DIFFERENTIAL
HCT: 36 — AB (ref 41–53)
Hemoglobin: 11.9 — AB (ref 13.5–17.5)
Neutrophils Absolute: 2151
Platelets: 200 10*3/uL (ref 150–400)
WBC: 4.5

## 2024-03-09 LAB — CBC: RBC: 4.1 (ref 3.87–5.11)

## 2024-03-09 LAB — PSA: PSA: 3

## 2024-03-14 LAB — HM DIABETES EYE EXAM

## 2024-03-15 ENCOUNTER — Encounter: Payer: Self-pay | Admitting: Student

## 2024-03-29 ENCOUNTER — Encounter: Payer: Self-pay | Admitting: Student

## 2024-03-29 ENCOUNTER — Non-Acute Institutional Stay (SKILLED_NURSING_FACILITY): Payer: Self-pay | Admitting: Student

## 2024-03-29 DIAGNOSIS — E119 Type 2 diabetes mellitus without complications: Secondary | ICD-10-CM

## 2024-03-29 DIAGNOSIS — F0150 Vascular dementia without behavioral disturbance: Secondary | ICD-10-CM | POA: Diagnosis not present

## 2024-03-29 DIAGNOSIS — E785 Hyperlipidemia, unspecified: Secondary | ICD-10-CM

## 2024-03-29 NOTE — Progress Notes (Unsigned)
 Location:  [facility] [place].  Nursing Home Room Number: [facility] 512A Place of Service:  SNF [phone]) Provider:  Valrie Gehrig, MD  Patient Care Team: Valrie Gehrig, MD as PCP - General (Family Medicine) Jerelene Monday, Deadra Everts, MD as Consulting Physician (Cardiology) Thersia Flax, MD (Internal Medicine)  Extended Emergency Contact Information Primary Emergency Contact: Causey,Janet Address: [address]          [place], [place] [address] [place] Home Phone: [phone] Relation: Spouse Secondary Emergency Contact: Delma Fern Mobile Phone: [phone] Relation: Nephew  Code Status:  DNR Goals of care: Advanced Directive information    03/29/2024   11:02 AM  Advanced Directives  Does Patient Have a Medical Advance Directive? Yes  Type of Estate agent of [place];Out of facility DNR (pink MOST or yellow form);Living will  Does patient want to make changes to medical advance directive? No - Patient declined  Copy of Healthcare Power of Attorney in Chart? Yes - validated most recent copy scanned in chart (See row information)     Chief Complaint  Patient presents with   Medical Management of Chronic Issues    Medical Management of Chronic Issues.     HPI:  Pt is a 89 y.o. male seen today for medical management of chronic diseases.   He reports feeling 'great' with no complaints. He has been sleeping and eating well, having regular bowel movements, and urinating without difficulty. He denies any pain. The patient is cognizant of his age and birth date, but had a minor lapse in the current month.  He goes to the adult day center faithfully. He has some afternoon frustration when he does not go to day center.   Nursing without concerns at this time.    Past Medical History:  Diagnosis Date   Acute gallstone pancreatitis 02/22/2021   Ascending cholangitis 02/22/2021   Atrial fibrillation (HCC)    Choledocholithiasis with  acute cholecystitis with obstruction 02/22/2021   Diabetes mellitus without complication (HCC)    E coli bacteremia 02/22/2021   Fatigue    HLD (hyperlipidemia)    Hypertension    OSA (obstructive sleep apnea)    Radial fracture 2006   right, screws and plates   Sepsis due to Escherichia coli (E. coli) (HCC) 02/22/2021   SVT (supraventricular tachycardia) (HCC) 2012   a. Holter in 2012 showed 27 episodes of SVT/atrial tach w/ longest being 36 beats; b. asymptomatic   Syncope 08/2015   a. short rhythm strip in [facility] ED showed junctional escape rhythm and AV dissociation with essentially sinus bradycardia   Ulnar fracture 1995   fall off of ladder   Past Surgical History:  Procedure Laterality Date   ERCP Left 02/22/2021   Procedure: ENDOSCOPIC RETROGRADE CHOLANGIOPANCREATOGRAPHY (ERCP);  Surgeon: Ozell Blunt, MD;  Location: [facility] ENDOSCOPY;  Service: Endoscopy;  Laterality: Left;   HERNIA REPAIR  Aug 2012   right indirect inguinal, with mesh   PANCREATIC STENT PLACEMENT  02/22/2021   Procedure: PANCREATIC STENT PLACEMENT;  Surgeon: Ozell Blunt, MD;  Location: [facility] ENDOSCOPY;  Service: Endoscopy;;   REMOVAL OF STONES  02/22/2021   Procedure: REMOVAL OF STONES;  Surgeon: Ozell Blunt, MD;  Location: [facility] ENDOSCOPY;  Service: Endoscopy;;   SPHINCTEROTOMY  02/22/2021   Procedure: [address];  Surgeon: Ozell Blunt, MD;  Location: [facility] ENDOSCOPY;  Service: Endoscopy;;    No Known Allergies  Outpatient Encounter Medications as of 03/29/2024  Medication Sig   carboxymethylcellulose (ARTIFICIAL TEARS) 1 % ophthalmic  solution Apply 1 drop to eye every 6 (six) hours as needed (dry eyes). Or in the affected eye four times a day as needed   metoprolol  tartrate (LOPRESSOR ) 25 MG tablet Take 0.5 tablets (12.5 mg total) by mouth 2 (two) times daily.   Multiple Vitamins-Minerals (MENS 50+ MULTI VITAMIN/MIN PO) Take 1 tablet by mouth daily.   Sodium Fluoride (PREVIDENT 5000 BOOSTER PLUS) 1.1 % PSTE Place 1  Application onto teeth daily.   acetaminophen  (TYLENOL ) 500 MG tablet Take 1,000 mg by mouth every 4 (four) hours as needed for mild pain or fever. (Patient not taking: Reported on 03/29/2024)   ipratropium-albuterol  (DUONEB) 0.5-2.5 (3) MG/3ML SOLN Take 3 mLs by nebulization every 6 (six) hours as needed. (Patient not taking: Reported on 03/29/2024)   ketoconazole (NIZORAL) 2 % shampoo Apply 1 Application topically 2 (two) times a week. Tuesday and Friday (Patient not taking: Reported on 03/29/2024)   vitamin B-12 (CYANOCOBALAMIN ) 1000 MCG tablet Take 1,000 mcg by mouth daily.   No facility-administered encounter medications on file as of 03/29/2024.    Review of Systems  Immunization History  Administered Date(s) Administered   Influenza, High Dose Seasonal PF 08/25/2022   Influenza, Seasonal, Injecte, Preservative Fre 09/08/2015, 09/03/2016   Influenza-Unspecified 09/12/2013, 08/18/2014, 09/01/2023   Moderna Covid-19 Fall Seasonal Vaccine 43yrs & older 02/16/2023   Moderna Covid-19 Vaccine Bivalent Booster 2yrs & up 08/01/2021, 04/07/2022   Moderna SARS-COV2 Booster Vaccination 09/20/2020, 03/28/2021   Moderna Sars-Covid-2 Vaccination 11/20/2019, 12/18/2019, 08/06/2023   PPD Test 04/29/2016   Pneumococcal Conjugate-13 01/23/2015   Pneumococcal Polysaccharide-23 01/20/2012, 06/22/2013   Tdap 08/21/2013   Zoster Recombinant(Shingrix) 08/02/2006, 05/05/2023   Zoster, Live 08/21/2013   Pertinent  Health Maintenance Due  Topic Date Due   FOOT EXAM  03/15/2024   INFLUENZA VACCINE  06/09/2024   HEMOGLOBIN A1C  09/09/2024   OPHTHALMOLOGY EXAM  03/14/2025      02/26/2021    7:20 PM 02/27/2021    7:57 AM 02/27/2021    8:00 PM 02/28/2021    8:00 AM 05/04/2023    2:54 PM  Fall Risk  Falls in the past year?     0  (RETIRED) Patient Fall Risk Level High fall risk High fall risk High fall risk High fall risk    Functional Status Survey:    Vitals:   03/29/24 1053 03/29/24 1103  BP: (!)  153/81 (!) 143/73  Pulse: 79   Resp: 18   Temp: 98.1 F (36.7 C)   SpO2: 97%   Weight: 161 lb 3.2 oz (73.1 kg)   Height: 5\' 8"  (1.727 m)    Body mass index is 24.51 kg/m. Physical Exam Constitutional:      Appearance: Normal appearance.  Cardiovascular:     Rate and Rhythm: Normal rate and regular rhythm.     Pulses: Normal pulses.     Heart sounds: Normal heart sounds.  Pulmonary:     Effort: Pulmonary effort is normal.  Abdominal:     General: Abdomen is flat. Bowel sounds are normal.     Palpations: Abdomen is soft.  Musculoskeletal:        General: No swelling or tenderness.  Skin:    General: Skin is warm and dry.  Neurological:     Mental Status: He is alert and oriented to person, place, and time.     Gait: Gait normal.  Psychiatric:        Mood and Affect: Mood normal.     Labs reviewed:  Recent Labs    09/20/23 0000 03/09/24 0000  NA 138 141  K 4.1 3.9  CL 104 105  CO2 30* 28*  BUN 14 16  CREATININE 0.7 0.8  CALCIUM  8.8 8.8   Recent Labs    09/20/23 0000 03/09/24 0000  AST 18 16  ALT 12 12  ALKPHOS 74 84  ALBUMIN 3.8 3.6   Recent Labs    09/20/23 0000 03/09/24 0000  WBC 5.3 4.5  NEUTROABS 2,581.00 2,151.00  HGB 13.1* 11.9*  HCT 40* 36*  PLT 213 200   Lab Results  Component Value Date   TSH 0.359 02/23/2021   Lab Results  Component Value Date   HGBA1C 7.5 03/09/2024   Lab Results  Component Value Date   CHOL 154 02/24/2021   HDL 44 02/24/2021   LDLCALC 92 02/24/2021   LDLDIRECT 124.0 01/04/2017   TRIG 91 02/24/2021   CHOLHDL 3.5 02/24/2021    Significant Diagnostic Results in last 30 days:  No results found.  Assessment/Plan Diabetes mellitus type 2, diet-controlled (HCC)  Vascular dementia without behavioral disturbance (HCC)  Hyperlipidemia with target LDL less than 70 Patient is doing well. No clear signs of decline at this time. A1c at goal without medications. Ambulatory and requires minimal assistance with  ADLs.   Family/ staff Communication: nursing  Labs/tests ordered:  none

## 2024-04-07 ENCOUNTER — Encounter: Payer: Self-pay | Admitting: Student

## 2024-05-16 ENCOUNTER — Encounter: Payer: Self-pay | Admitting: Nurse Practitioner

## 2024-05-16 ENCOUNTER — Non-Acute Institutional Stay (SKILLED_NURSING_FACILITY): Admitting: Nurse Practitioner

## 2024-05-16 DIAGNOSIS — E785 Hyperlipidemia, unspecified: Secondary | ICD-10-CM | POA: Diagnosis not present

## 2024-05-16 DIAGNOSIS — D649 Anemia, unspecified: Secondary | ICD-10-CM

## 2024-05-16 DIAGNOSIS — E119 Type 2 diabetes mellitus without complications: Secondary | ICD-10-CM

## 2024-05-16 DIAGNOSIS — F0150 Vascular dementia without behavioral disturbance: Secondary | ICD-10-CM

## 2024-05-16 DIAGNOSIS — I471 Supraventricular tachycardia, unspecified: Secondary | ICD-10-CM

## 2024-05-16 NOTE — Assessment & Plan Note (Signed)
 Encouraged dietary compliance, routine foot care/monitoring and to keep up with diabetic eye exams through ophthalmology  -continue to monitor A1c every 6 months.

## 2024-05-16 NOTE — Assessment & Plan Note (Signed)
 Slow progressive decline, continues with his routine. Continue with supportive care.

## 2024-05-16 NOTE — Assessment & Plan Note (Signed)
 Not on statin at this time due to age and dementia

## 2024-05-16 NOTE — Progress Notes (Signed)
 Location:  Other [facility].  Nursing Home Room Number: [facility] [address] Place of Service:  SNF [phone]) Harlene An, NP  PCP: Abdul Fine, MD  Patient Care Team: Abdul Fine, MD as PCP - General (Family Medicine) Perla, Evalene PARAS, MD as Consulting Physician (Cardiology) Marylynn Verneita CROME, MD (Internal Medicine)  Extended Emergency Contact Information Primary Emergency Contact: Mario,Janet Address: [address]          [place], [place] [address] [place] Home Phone: [phone] Relation: Spouse Secondary Emergency Contact: Geradine Lerner Mobile Phone: [phone] Relation: Nephew  Goals of care: Advanced Directive information    03/29/2024   11:02 AM  Advanced Directives  Does Patient Have a Medical Advance Directive? Yes  Type of Estate agent of [place];Out of facility DNR (pink MOST or yellow form);Living will  Does patient want to make changes to medical advance directive? No - Patient declined  Copy of Healthcare Power of Attorney in Chart? Yes - validated most recent copy scanned in chart (See row information)     Chief Complaint  Patient presents with   Medical Management of Chronic Issues    Medical Management of Chronic Issues.     HPI:  Pt is a 89 y.o. male seen today for medical management of chronic disease. Pt with hx of dementia, SVT, anemia, DM.  He continues to walk with waker. He goes to the adult day program at facility during the week and does well with his routine. He has no complaints at this time and staff has no concerns.  He is eating well. Weight has been stable.     Past Medical History:  Diagnosis Date   Acute gallstone pancreatitis 02/22/2021   Ascending cholangitis 02/22/2021   Atrial fibrillation (HCC)    Choledocholithiasis with acute cholecystitis with obstruction 02/22/2021   Diabetes mellitus without complication (HCC)    E coli bacteremia 02/22/2021   Fatigue    HLD  (hyperlipidemia)    Hypertension    OSA (obstructive sleep apnea)    Radial fracture 2006   right, screws and plates   Sepsis due to Escherichia coli (E. coli) (HCC) 02/22/2021   SVT (supraventricular tachycardia) (HCC) 2012   a. Holter in 2012 showed 27 episodes of SVT/atrial tach w/ longest being 36 beats; b. asymptomatic   Syncope 08/2015   a. short rhythm strip in [facility] ED showed junctional escape rhythm and AV dissociation with essentially sinus bradycardia   Ulnar fracture 1995   fall off of ladder   Past Surgical History:  Procedure Laterality Date   ERCP Left 02/22/2021   Procedure: ENDOSCOPIC RETROGRADE CHOLANGIOPANCREATOGRAPHY (ERCP);  Surgeon: Rosalie Kitchens, MD;  Location: [facility] ENDOSCOPY;  Service: Endoscopy;  Laterality: Left;   HERNIA REPAIR  Aug 2012   right indirect inguinal, with mesh   PANCREATIC STENT PLACEMENT  02/22/2021   Procedure: PANCREATIC STENT PLACEMENT;  Surgeon: Rosalie Kitchens, MD;  Location: [facility] ENDOSCOPY;  Service: Endoscopy;;   REMOVAL OF STONES  02/22/2021   Procedure: REMOVAL OF STONES;  Surgeon: Rosalie Kitchens, MD;  Location: [facility] ENDOSCOPY;  Service: Endoscopy;;   SPHINCTEROTOMY  02/22/2021   Procedure: ANNETT;  Surgeon: Rosalie Kitchens, MD;  Location[facility] ENDOSCOPY;  Service: Endoscopy;;    No Known Allergies  Outpatient Encounter Medications as of 05/16/2024  Medication Sig   acetaminophen  (TYLENOL ) 325 MG tablet Take 650 mg by mouth every 4 (four) hours as needed.   carboxymethylcellulose (ARTIFICIAL TEARS) 1 % ophthalmic solution Apply 1 drop  to eye every 6 (six) hours as needed (dry eyes). Or in the affected eye four times a day as needed   metoprolol  tartrate (LOPRESSOR ) 25 MG tablet Take 0.5 tablets (12.5 mg total) by mouth 2 (two) times daily.   Multiple Vitamins-Minerals (MENS 50+ MULTI VITAMIN/MIN PO) Take 1 tablet by mouth daily.   Sodium Fluoride (PREVIDENT 5000 BOOSTER PLUS) 1.1 % PSTE Place 1 Application onto teeth daily.   vitamin B-12  (CYANOCOBALAMIN ) 1000 MCG tablet Take 1,000 mcg by mouth daily.   No facility-administered encounter medications on file as of 05/16/2024.    Review of Systems  Unable to perform ROS: Dementia     Immunization History  Administered Date(s) Administered   Influenza, High Dose Seasonal PF 08/25/2022   Influenza, Seasonal, Injecte, Preservative Fre 09/08/2015, 09/03/2016   Influenza-Unspecified 09/12/2013, 08/18/2014, 09/01/2023   Moderna Covid-19 Fall Seasonal Vaccine 38yrs & older 02/16/2023   Moderna Covid-19 Vaccine Bivalent Booster 24yrs & up 08/01/2021, 04/07/2022   Moderna SARS-COV2 Booster Vaccination 09/20/2020, 03/28/2021   Moderna Sars-Covid-2 Vaccination 11/20/2019, 12/18/2019, 08/06/2023   PPD Test 04/29/2016   Pneumococcal Conjugate-13 01/23/2015   Pneumococcal Polysaccharide-23 01/20/2012, 06/22/2013   Tdap 08/21/2013   Zoster Recombinant(Shingrix) 08/02/2006, 05/05/2023   Zoster, Live 08/21/2013   Pertinent  Health Maintenance Due  Topic Date Due   FOOT EXAM  03/15/2024   INFLUENZA VACCINE  06/09/2024   HEMOGLOBIN A1C  09/09/2024   OPHTHALMOLOGY EXAM  03/14/2025      02/26/2021    7:20 PM 02/27/2021    7:57 AM 02/27/2021    8:00 PM 02/28/2021    8:00 AM 05/04/2023    2:54 PM  Fall Risk  Falls in the past year?     0  (RETIRED) Patient Fall Risk Level High fall risk  High fall risk  High fall risk  High fall risk       Data saved with a previous flowsheet row definition   Functional Status Survey:    Vitals:   05/16/24 0913  BP: 129/69  Pulse: 70  Resp: 16  Temp: (!) 97.3 F (36.3 C)  SpO2: 96%  Weight: 162 lb 3.2 oz (73.6 kg)  Height: 5' 8 (1.727 m)   Body mass index is 24.66 kg/m.  Filed Weights   05/16/24 0913  Weight: 162 lb 3.2 oz (73.6 kg)    Physical Exam Constitutional:      General: He is not in acute distress.    Appearance: He is well-developed. He is not diaphoretic.  HENT:     Head: Normocephalic and atraumatic.     Right Ear:  External ear normal.     Left Ear: External ear normal.     Mouth/Throat:     Pharynx: No oropharyngeal exudate.  Eyes:     Conjunctiva/sclera: Conjunctivae normal.     Pupils: Pupils are equal, round, and reactive to light.  Cardiovascular:     Rate and Rhythm: Normal rate and regular rhythm.     Heart sounds: Normal heart sounds.  Pulmonary:     Effort: Pulmonary effort is normal.     Breath sounds: Normal breath sounds.  Abdominal:     General: Bowel sounds are normal.     Palpations: Abdomen is soft.  Musculoskeletal:        General: No tenderness.     Cervical back: Normal range of motion and neck supple.     Right lower leg: No edema.     Left lower leg: No edema.  Skin:    General: Skin is warm and dry.  Neurological:     Mental Status: He is alert and oriented to person, place, and time.     Labs reviewed: Recent Labs    09/20/23 0000 03/09/24 0000  NA 138 141  K 4.1 3.9  CL 104 105  CO2 30* 28*  BUN 14 16  CREATININE 0.7 0.8  CALCIUM  8.8 8.8   Recent Labs    09/20/23 0000 03/09/24 0000  AST 18 16  ALT 12 12  ALKPHOS 74 84  ALBUMIN 3.8 3.6   Recent Labs    09/20/23 0000 03/09/24 0000  WBC 5.3 4.5  NEUTROABS 2,581.00 2,151.00  HGB 13.1* 11.9*  HCT 40* 36*  PLT 213 200   Lab Results  Component Value Date   TSH 0.359 02/23/2021   Lab Results  Component Value Date   HGBA1C 7.5 03/09/2024   Lab Results  Component Value Date   CHOL 154 02/24/2021   HDL 44 02/24/2021   LDLCALC 92 02/24/2021   LDLDIRECT 124.0 01/04/2017   TRIG 91 02/24/2021   CHOLHDL 3.5 02/24/2021    Significant Diagnostic Results in last 30 days:  No results found.  Assessment/Plan Diabetes mellitus type 2, diet-controlled (HCC) Encouraged dietary compliance, routine foot care/monitoring and to keep up with diabetic eye exams through ophthalmology  -continue to monitor A1c every 6 months.   Hyperlipidemia with target LDL less than 70 Not on statin at this time  due to age and dementia  SVT (supraventricular tachycardia) Rate controlled on metoprolol .   Vascular dementia without behavioral disturbance (HCC) Slow progressive decline, continues with his routine. Continue with supportive care.     Jevante Hollibaugh K. Caro BODILY [facility] [phone]

## 2024-05-16 NOTE — Assessment & Plan Note (Signed)
Rate controlled on metoprolol.  °

## 2024-06-14 ENCOUNTER — Non-Acute Institutional Stay (SKILLED_NURSING_FACILITY): Payer: Self-pay | Admitting: Student

## 2024-06-14 ENCOUNTER — Encounter: Payer: Self-pay | Admitting: Student

## 2024-06-14 DIAGNOSIS — R159 Full incontinence of feces: Secondary | ICD-10-CM | POA: Diagnosis not present

## 2024-06-14 DIAGNOSIS — K5900 Constipation, unspecified: Secondary | ICD-10-CM | POA: Diagnosis not present

## 2024-06-14 DIAGNOSIS — F0150 Vascular dementia without behavioral disturbance: Secondary | ICD-10-CM | POA: Diagnosis not present

## 2024-06-14 DIAGNOSIS — E119 Type 2 diabetes mellitus without complications: Secondary | ICD-10-CM | POA: Diagnosis not present

## 2024-06-14 NOTE — Progress Notes (Signed)
 Location:  [facility] [place] [facility].  Nursing Home Room Number: [facility] [id number] Place of Service:  SNF [phone]) Provider:  Abdul Fine, MD  Patient Care Team: Abdul Fine, MD as PCP - General (Family Medicine) Perla, Evalene PARAS, MD as Consulting Physician (Cardiology) Marylynn Verneita CROME, MD (Internal Medicine)  Extended Emergency Contact Information Primary Emergency Contact: Bednarz,Janet Address: [address]          [place], [place] [address] [place] Home Phone: [phone] Relation: Spouse Secondary Emergency Contact: Geradine Lerner Mobile Phone: [phone] Relation: Nephew  Code Status:  DNR Goals of care: Advanced Directive information    06/14/2024    9:08 AM  Advanced Directives  Does Patient Have a Medical Advance Directive? Yes  Type of Estate agent of [place];Living will;Out of facility DNR (pink MOST or yellow form)  Does patient want to make changes to medical advance directive? No - Patient declined  Copy of Healthcare Power of Attorney in Chart? Yes - validated most recent copy scanned in chart (See row information)     Chief Complaint  Patient presents with   Medical Management of Chronic Issues    Medical Management of Chronic Issues.     HPI:  Pt is a 89 y.o. male seen today for medical management of chronic diseases.    History of Present Illness The patient presents with difficulty in mobility and toileting assistance needs. He is accompanied by a nurse who assists him with mobility.  He experiences significant difficulty with mobility, particularly in the bathroom, requiring assistance to stand up after using the toilet.  There is also a concern regarding toileting, as he was found with soiled clothes on the floor.   Past Medical History:  Diagnosis Date   Acute gallstone pancreatitis 02/22/2021   Ascending cholangitis 02/22/2021   Atrial fibrillation (HCC)    Choledocholithiasis with acute  cholecystitis with obstruction 02/22/2021   Diabetes mellitus without complication (HCC)    E coli bacteremia 02/22/2021   Fatigue    HLD (hyperlipidemia)    Hypertension    OSA (obstructive sleep apnea)    Radial fracture 2006   right, screws and plates   Sepsis due to Escherichia coli (E. coli) (HCC) 02/22/2021   SVT (supraventricular tachycardia) (HCC) 2012   a. Holter in 2012 showed 27 episodes of SVT/atrial tach w/ longest being 36 beats; b. asymptomatic   Syncope 08/2015   a. short rhythm strip in [facility] ED showed junctional escape rhythm and AV dissociation with essentially sinus bradycardia   Ulnar fracture 1995   fall off of ladder   Past Surgical History:  Procedure Laterality Date   ERCP Left 02/22/2021   Procedure: ENDOSCOPIC RETROGRADE CHOLANGIOPANCREATOGRAPHY (ERCP);  Surgeon: Rosalie Kitchens, MD;  Location: [facility] ENDOSCOPY;  Service: Endoscopy;  Laterality: Left;   HERNIA REPAIR  Aug 2012   right indirect inguinal, with mesh   PANCREATIC STENT PLACEMENT  02/22/2021   Procedure: PANCREATIC STENT PLACEMENT;  Surgeon: Rosalie Kitchens, MD;  Location: [facility] ENDOSCOPY;  Service: Endoscopy;;   REMOVAL OF STONES  02/22/2021   Procedure: REMOVAL OF STONES;  Surgeon: Rosalie Kitchens, MD;  Location: [facility] ENDOSCOPY;  Service: Endoscopy;;   SPHINCTEROTOMY  02/22/2021   Procedure: ANNETT;  Surgeon: Rosalie Kitchens, MD;  Location: [facility] ENDOSCOPY;  Service: Endoscopy;;    No Known Allergies  Outpatient Encounter Medications as of 06/14/2024  Medication Sig   acetaminophen  (TYLENOL ) 325 MG tablet Take 650 mg by mouth every 4 (four) hours as needed.  carboxymethylcellulose (ARTIFICIAL TEARS) 1 % ophthalmic solution Apply 1 drop to eye every 6 (six) hours as needed (dry eyes). Or in the affected eye four times a day as needed   metoprolol  tartrate (LOPRESSOR ) 25 MG tablet Take 0.5 tablets (12.5 mg total) by mouth 2 (two) times daily.   Multiple Vitamins-Minerals (MENS 50+ MULTI VITAMIN/MIN PO) Take 1 tablet  by mouth daily.   Sodium Fluoride (PREVIDENT 5000 BOOSTER PLUS) 1.1 % PSTE Place 1 Application onto teeth daily.   vitamin B-12 (CYANOCOBALAMIN ) 1000 MCG tablet Take 1,000 mcg by mouth daily.   No facility-administered encounter medications on file as of 06/14/2024.    Review of Systems  Immunization History  Administered Date(s) Administered   Influenza, High Dose Seasonal PF 08/25/2022   Influenza, Seasonal, Injecte, Preservative Fre 09/08/2015, 09/03/2016   Influenza-Unspecified 09/12/2013, 08/18/2014, 09/01/2023   Moderna Covid-19 Fall Seasonal Vaccine 77yrs & older 02/16/2023   Moderna Covid-19 Vaccine Bivalent Booster 57yrs & up 08/01/2021, 04/07/2022   Moderna SARS-COV2 Booster Vaccination 09/20/2020, 03/28/2021   Moderna Sars-Covid-2 Vaccination 11/20/2019, 12/18/2019, 08/06/2023   PPD Test 04/29/2016   Pneumococcal Conjugate-13 01/23/2015   Pneumococcal Polysaccharide-23 01/20/2012, 06/22/2013   Tdap 08/21/2013   Unspecified SARS-COV-2 Vaccination 02/18/2024   Zoster Recombinant(Shingrix) 08/02/2006, 05/05/2023   Zoster, Live 08/21/2013   Pertinent  Health Maintenance Due  Topic Date Due   FOOT EXAM  03/15/2024   INFLUENZA VACCINE  06/09/2024   HEMOGLOBIN A1C  09/09/2024   OPHTHALMOLOGY EXAM  03/14/2025      02/26/2021    7:20 PM 02/27/2021    7:57 AM 02/27/2021    8:00 PM 02/28/2021    8:00 AM 05/04/2023    2:54 PM  Fall Risk  Falls in the past year?     0  (RETIRED) Patient Fall Risk Level High fall risk  High fall risk  High fall risk  High fall risk       Data saved with a previous flowsheet row definition   Functional Status Survey:    Vitals:   06/14/24 0903  BP: 139/76  Pulse: 73  Resp: 16  Temp: (!) 97.3 F (36.3 C)  SpO2: 95%  Weight: 159 lb 6.4 oz (72.3 kg)  Height: 5' 8 (1.727 m)   Body mass index is 24.24 kg/m. Physical Exam Constitutional:      Appearance: Normal appearance.  Cardiovascular:     Rate and Rhythm: Normal rate.   Pulmonary:     Effort: Pulmonary effort is normal.  Skin:    General: Skin is warm and dry.  Neurological:     Mental Status: He is alert. Mental status is at baseline.     Labs reviewed: Recent Labs    09/20/23 0000 03/09/24 0000  NA 138 141  K 4.1 3.9  CL 104 105  CO2 30* 28*  BUN 14 16  CREATININE 0.7 0.8  CALCIUM  8.8 8.8   Recent Labs    09/20/23 0000 03/09/24 0000  AST 18 16  ALT 12 12  ALKPHOS 74 84  ALBUMIN 3.8 3.6   Recent Labs    09/20/23 0000 03/09/24 0000  WBC 5.3 4.5  NEUTROABS 2,581.00 2,151.00  HGB 13.1* 11.9*  HCT 40* 36*  PLT 213 200   Lab Results  Component Value Date   TSH 0.359 02/23/2021   Lab Results  Component Value Date   HGBA1C 7.5 03/09/2024   Lab Results  Component Value Date   CHOL 154 02/24/2021   HDL 44 02/24/2021  LDLCALC 92 02/24/2021   LDLDIRECT 124.0 01/04/2017   TRIG 91 02/24/2021   CHOLHDL 3.5 02/24/2021    Significant Diagnostic Results in last 30 days:  No results found.  Assessment/Plan Impaired mobility Experiencing impaired mobility, evidenced by difficulty in rising from the toilet and requiring assistance, indicating a possible decline in physical function, either acute or chronic.  Constipation  Fecal incontinence Experienced fecal incontinence, as indicated by soiled clothes, potentially related to impaired mobility or other underlying conditions.En  Diabetes  well controlled with dietary management, no medications at this time.   Dementia Patient spends M-F at adult day center. Enjoys activities. Remains independent with ambulation using a 4-wheel walker. Periodically difficult to redirect, however, successful non-pharmacologic interventions at this time.Unclear if incontinence is new baseline, will continue to monitor and provide supportive care.   Family/ staff Communication: nursing  Labs/tests ordered:  none

## 2024-06-22 ENCOUNTER — Encounter: Payer: Self-pay | Admitting: Student

## 2024-06-29 ENCOUNTER — Encounter: Payer: Self-pay | Admitting: Nurse Practitioner

## 2024-06-29 ENCOUNTER — Non-Acute Institutional Stay: Payer: Self-pay | Admitting: Nurse Practitioner

## 2024-06-29 DIAGNOSIS — Z0000 Encounter for general adult medical examination without abnormal findings: Secondary | ICD-10-CM | POA: Diagnosis not present

## 2024-06-29 NOTE — Progress Notes (Signed)
 Subjective:   Hector Lucero is a 89 y.o. male who presents for Medicare Annual/Subsequent preventive examination.  Visit Complete: In person TL    Cardiac Risk Factors include: advanced age (>63men, >67 women);hypertension;diabetes mellitus;dyslipidemia;male gender;sedentary lifestyle     Objective:    Today's Vitals   06/29/24 0902 06/29/24 0906  BP: (!) 147/65 134/73  Pulse: 74   Resp: 16   Temp: (!) 97.3 F (36.3 C)   SpO2: 95%   Weight: 159 lb 6.4 oz (72.3 kg)   Height: 5' 8 (1.727 m)    Body mass index is 24.24 kg/m.     06/14/2024    9:08 AM 03/29/2024   11:02 AM 12/09/2023    9:54 AM 11/17/2023    8:42 AM 09/16/2023    9:25 AM 07/19/2023   10:46 AM 06/21/2023   11:12 AM  Advanced Directives  Does Patient Have a Medical Advance Directive? Yes Yes Yes Yes Yes Yes Yes  Type of Estate agent of [place];Living will;Out of facility DNR (pink MOST or yellow form) Healthcare Power of [place];Out of facility DNR (pink MOST or yellow form);Living will Healthcare Power of [place];Out of facility DNR (pink MOST or yellow form);Living will Healthcare Power of [place];Out of facility DNR (pink MOST or yellow form);Living will Healthcare Power of [place];Out of facility DNR (pink MOST or yellow form);Living will Healthcare Power of [place];Living will;Out of facility DNR (pink MOST or yellow form) Healthcare Power of [place];Living will;Out of facility DNR (pink MOST or yellow form)  Does patient want to make changes to medical advance directive? No - Patient declined No - Patient declined No - Patient declined No - Patient declined No - Patient declined No - Patient declined No - Patient declined  Copy of Healthcare Power of Attorney in Chart? Yes - validated most recent copy scanned in chart (See row information) Yes - validated most recent copy scanned in chart (See row information) Yes - validated most recent copy scanned in chart (See row information) Yes -  validated most recent copy scanned in chart (See row information) Yes - validated most recent copy scanned in chart (See row information) Yes - validated most recent copy scanned in chart (See row information) Yes - validated most recent copy scanned in chart (See row information)  Pre-existing out of facility DNR order (yellow form or pink MOST form)      Yellow form placed in chart (order not valid for inpatient use) Yellow form placed in chart (order not valid for inpatient use)    Current Medications (verified) Outpatient Encounter Medications as of 06/29/2024  Medication Sig   acetaminophen  (TYLENOL ) 325 MG tablet Take 650 mg by mouth every 4 (four) hours as needed.   carboxymethylcellulose (ARTIFICIAL TEARS) 1 % ophthalmic solution Apply 1 drop to eye every 6 (six) hours as needed (dry eyes). Or in the affected eye four times a day as needed   metoprolol  tartrate (LOPRESSOR ) 25 MG tablet Take 0.5 tablets (12.5 mg total) by mouth 2 (two) times daily.   Multiple Vitamins-Minerals (MENS 50+ MULTI VITAMIN/MIN PO) Take 1 tablet by mouth daily.   polyethylene glycol (MIRALAX / GLYCOLAX) 17 g packet Take 17 g by mouth daily as needed.   Sodium Fluoride (PREVIDENT 5000 BOOSTER PLUS) 1.1 % PSTE Place 1 Application onto teeth daily.   vitamin B-12 (CYANOCOBALAMIN ) 1000 MCG tablet Take 1,000 mcg by mouth daily.   No facility-administered encounter medications on file as of 06/29/2024.    Allergies (  verified) Patient has no known allergies.   History: Past Medical History:  Diagnosis Date   Acute gallstone pancreatitis 02/22/2021   Ascending cholangitis 02/22/2021   Atrial fibrillation (HCC)    Choledocholithiasis with acute cholecystitis with obstruction 02/22/2021   Diabetes mellitus without complication (HCC)    E coli bacteremia 02/22/2021   Fatigue    HLD (hyperlipidemia)    Hypertension    OSA (obstructive sleep apnea)    Radial fracture 2006   right, screws and plates   Sepsis due  to Escherichia coli (E. coli) (HCC) 02/22/2021   SVT (supraventricular tachycardia) (HCC) 2012   a. Holter in 2012 showed 27 episodes of SVT/atrial tach w/ longest being 36 beats; b. asymptomatic   Syncope 08/2015   a. short rhythm strip in [facility] ED showed junctional escape rhythm and AV dissociation with essentially sinus bradycardia   Ulnar fracture 1995   fall off of ladder   Past Surgical History:  Procedure Laterality Date   ERCP Left 02/22/2021   Procedure: ENDOSCOPIC RETROGRADE CHOLANGIOPANCREATOGRAPHY (ERCP);  Surgeon: Rosalie Kitchens, MD;  Location: [facility] ENDOSCOPY;  Service: Endoscopy;  Laterality: Left;   HERNIA REPAIR  Aug 2012   right indirect inguinal, with mesh   PANCREATIC STENT PLACEMENT  02/22/2021   Procedure: PANCREATIC STENT PLACEMENT;  Surgeon: Rosalie Kitchens, MD;  Location: [facility] ENDOSCOPY;  Service: Endoscopy;;   REMOVAL OF STONES  02/22/2021   Procedure: REMOVAL OF STONES;  Surgeon: Rosalie Kitchens, MD;  Location: [facility] ENDOSCOPY;  Service: Endoscopy;;   SPHINCTEROTOMY  02/22/2021   Procedure: ANNETT;  Surgeon: Rosalie Kitchens, MD;  Location: [facility] ENDOSCOPY;  Service: Endoscopy;;   Family History  Problem Relation Age of Onset   Diabetes Mother    Social History   Socioeconomic History   Marital status: Married    Spouse name: Not on file   Number of children: 0   Years of education: Not on file   Highest education level: Not on file  Occupational History   Occupation: Retired    Comment: Market researcher.  Tobacco Use   Smoking status: Never   Smokeless tobacco: Never  Vaping Use   Vaping status: Never Used  Substance and Sexual Activity   Alcohol use: Yes    Alcohol/week: 3.0 standard drinks of alcohol    Types: 3 Glasses of wine per week    Comment: occasional   Drug use: No   Sexual activity: Not Currently  Other Topics Concern   Not on file  Social History Narrative   Exercises regularly 5 days weekly at the [employer] [facility]   4 degrees-- 2  Bachelor's, MBA and MA      Has living will   Wife is health care POA--- nephew Dempsey Dehel--is alternate   Has DNR already   No tube feeds if cognitively unaware   Social Drivers of Corporate investment banker Strain: Not on file  Food Insecurity: Not on file  Transportation Needs: Not on file  Physical Activity: Not on file  Stress: Not on file  Social Connections: Not on file    Tobacco Counseling Counseling given: Not Answered   Clinical Intake:  Pre-visit preparation completed: Yes  Pain : No/denies pain     BMI - recorded: 24 Nutritional Status: BMI of 19-24  Normal Nutritional Risks: Other (Comment) Diabetes: Yes  How often do you need to have someone help you when you read instructions, pamphlets, or other written materials from your doctor or pharmacy?: 5 -  Always         Activities of Daily Living    06/29/2024    8:36 AM  In your present state of health, do you have any difficulty performing the following activities:  Hearing? 1  Vision? 0  Difficulty concentrating or making decisions? 1  Walking or climbing stairs? 0  Dressing or bathing? 1  Doing errands, shopping? 1  Preparing Food and eating ? Y  Comment no trouble eating, does not prepare food  Using the Toilet? N  In the past six months, have you accidently leaked urine? N  Do you have problems with loss of bowel control? Y  Managing your Medications? Y  Managing your Finances? Y  Housekeeping or managing your Housekeeping? Y    Patient Care Team: Abdul Fine, MD as PCP - General (Family Medicine) Perla Evalene PARAS, MD as Consulting Physician (Cardiology) Marylynn Verneita CROME, MD (Internal Medicine)  Indicate any recent Medical Services you may have received from other than [facility] providers in the past year (date may be approximate).     Assessment:   This is a routine wellness examination for Ja[place].  Hearing/Vision screen No results found.   Goals Addressed   None     Depression Screen    06/29/2024    8:40 AM 01/04/2017    2:54 PM 01/02/2016    2:29 PM 01/26/2015    3:58 PM 03/07/2014   10:39 AM 09/21/2013   10:23 AM  PHQ 2/9 Scores  PHQ - 2 Score 0 0 0 0 0 0    Fall Risk    06/29/2024    8:39 AM 05/04/2023    2:54 PM 01/04/2017    2:54 PM 01/02/2016    2:29 PM 01/26/2015    3:58 PM  Fall Risk   Falls in the past year? 0 0 No  Yes  Yes   Number falls in past yr: 0   1    Injury with Fall? 1   Yes    Risk for fall due to :     Impaired vision   Follow up Falls evaluation completed   Falls prevention discussed       Data saved with a previous flowsheet row definition    MEDICARE RISK AT HOME:    TIMED UP AND GO:  Was the test performed?  No    Cognitive Function:        Immunizations Immunization History  Administered Date(s) Administered   Influenza, High Dose Seasonal PF 08/25/2022   Influenza, Seasonal, Injecte, Preservative Fre 09/08/2015, 09/03/2016   Influenza-Unspecified 09/12/2013, 08/18/2014, 09/01/2023   Moderna Covid-19 Fall Seasonal Vaccine 25yrs & older 02/16/2023   Moderna Covid-19 Vaccine Bivalent Booster 60yrs & up 08/01/2021, 04/07/2022   Moderna SARS-COV2 Booster Vaccination 09/20/2020, 03/28/2021   Moderna Sars-Covid-2 Vaccination 11/20/2019, 12/18/2019, 08/06/2023   PPD Test 04/29/2016   Pneumococcal Conjugate-13 01/23/2015   Pneumococcal Polysaccharide-23 01/20/2012, 06/22/2013   Tdap 08/21/2013   Unspecified SARS-COV-2 Vaccination 02/18/2024   Zoster Recombinant(Shingrix) 08/02/2006, 05/05/2023   Zoster, Live 08/21/2013    TDAP status: Due, Education has been provided regarding the importance of this vaccine. Advised may receive this vaccine at local pharmacy or Health Dept. Aware to provide a copy of the vaccination record if obtained from local pharmacy or Health Dept. Verbalized acceptance and understanding.  Flu Vaccine status: Due, Education has been provided regarding the importance of this  vaccine. Advised may receive this vaccine at local pharmacy or Health Dept. Aware to provide  a copy of the vaccination record if obtained from local pharmacy or Health Dept. Verbalized acceptance and understanding.  Pneumococcal vaccine status: Up to date  Covid-19 vaccine status: Information provided on how to obtain vaccines.   Qualifies for Shingles Vaccine? Yes   Zostavax completed No   Shingrix Completed?: Yes  Screening Tests Health Maintenance  Topic Date Due   DTaP/Tdap/Td (2 - Td or Tdap) 08/22/2023   INFLUENZA VACCINE  06/09/2024   COVID-19 Vaccine (8 - Moderna risk 2024-25 season) 08/19/2024   HEMOGLOBIN A1C  09/09/2024   OPHTHALMOLOGY EXAM  03/14/2025   FOOT EXAM  06/29/2025   Medicare Annual Wellness (AWV)  06/29/2025   Pneumococcal Vaccine: 50+ Years  Completed   Zoster Vaccines- Shingrix  Completed   HPV VACCINES  Aged Out   Meningococcal B Vaccine  Aged Out    Health Maintenance  Health Maintenance Due  Topic Date Due   DTaP/Tdap/Td (2 - Td or Tdap) 08/22/2023   INFLUENZA VACCINE  06/09/2024    Colorectal cancer screening: No longer required.   Lung Cancer Screening: (Low Dose CT Chest recommended if Age 5-80 years, 20 pack-year currently smoking OR have quit w/in 15years.) does not qualify.   Lung Cancer Screening Referral: na  Additional Screening:  Hepatitis C Screening: does not qualify; Completed na  Vision Screening: Recommended annual ophthalmology exams for early detection of glaucoma and other disorders of the eye. Is the patient up to date with their annual eye exam?  Yes  Who is the provider or what is the name of the office in which the patient attends annual eye exams? [facility] If pt is not established with a provider, would they like to be referred to a provider to establish care? No .   Dental Screening: Recommended annual dental exams for proper oral hygiene  Diabetic Foot Exam: Diabetic Foot Exam: Completed today  Community  Resource Referral / Chronic Care Management: CRR required this visit?  No   CCM required this visit?  No     Plan:     I have personally reviewed and noted the following in the patient's chart:   Medical and social history Use of alcohol, tobacco or illicit drugs  Current medications and supplements including opioid prescriptions. Patient is not currently taking opioid prescriptions. Functional ability and status Nutritional status Physical activity Advanced directives List of other physicians Hospitalizations, surgeries, and ER visits in previous 12 months Vitals Screenings to include cognitive, depression, and falls Referrals and appointments  In addition, I have reviewed and discussed with patient certain preventive protocols, quality metrics, and best practice recommendations. A written personalized care plan for preventive services as well as general preventive health recommendations were provided to patient.     Harlene MARLA An, NP   06/29/2024

## 2024-08-21 ENCOUNTER — Encounter: Payer: Self-pay | Admitting: Adult Health

## 2024-08-21 ENCOUNTER — Non-Acute Institutional Stay (SKILLED_NURSING_FACILITY): Payer: Self-pay | Admitting: Adult Health

## 2024-08-21 DIAGNOSIS — I471 Supraventricular tachycardia, unspecified: Secondary | ICD-10-CM

## 2024-08-21 DIAGNOSIS — E1169 Type 2 diabetes mellitus with other specified complication: Secondary | ICD-10-CM

## 2024-08-21 DIAGNOSIS — F0150 Vascular dementia without behavioral disturbance: Secondary | ICD-10-CM | POA: Diagnosis not present

## 2024-08-21 NOTE — Progress Notes (Signed)
 Location:  Other [facility] Room Number: [place] [address] [facility]) Place of Service:  SNF ([phone]) Provider:  Medina-Vargas, Emrey Thornley, DNP, FNP-BC  Patient Care Team: Abdul Fine, MD as PCP - General (Family Medicine) Perla Evalene PARAS, MD as Consulting Physician (Cardiology) Marylynn Verneita CROME, MD (Internal Medicine)  Extended Emergency Contact Information Primary Emergency Contact: Gambill,Janet Address: [address]          [place], [place] [address] [place] Home Phone: [phone] Relation: Spouse Secondary Emergency Contact: Geradine Lerner Mobile Phone: [phone] Relation: Nephew  Code Status:  DNR  Goals of care: Advanced Directive information    08/21/2024    3:14 PM  Advanced Directives  Does Patient Have a Medical Advance Directive? Yes  Type of Estate agent of [place];Living will;Out of facility DNR (pink MOST or yellow form)  Does patient want to make changes to medical advance directive? No - Patient declined  Copy of Healthcare Power of Attorney in Chart? Yes - validated most recent copy scanned in chart (See row information)  Pre-existing out of facility DNR order (yellow form or pink MOST form) Yellow form placed in chart (order not valid for inpatient use)     Chief Complaint  Patient presents with   Medical Management of Chronic Issues    Routine Visit, needs to discuss tetanus, flu, and covid vaccine     HPI:  Pt is a 89 y.o. male seen today for medical management of chronic diseases. He is a resident of [facility]. He attends the Daycare at [facility] on Monday to Friday from 9AM - 3PM.  Type 2 diabetes mellitus with other specified complication, without long-term current use of insulin  (HCC) -  A1C 7.5, not on medication  SVT (supraventricular tachycardia) -  takes metoprolol  tartrate 12.5 mg twice a day  Vascular dementia without behavioral disturbance (HCC)  -  BIMS  score 7/15, attends [facility]   daycare  Past Medical History:  Diagnosis Date   Acute gallstone pancreatitis 02/22/2021   Ascending cholangitis (HCC) 02/22/2021   Atrial fibrillation (HCC)    Choledocholithiasis with acute cholecystitis with obstruction 02/22/2021   Diabetes mellitus without complication (HCC)    E coli bacteremia 02/22/2021   Fatigue    HLD (hyperlipidemia)    Hypertension    OSA (obstructive sleep apnea)    Radial fracture 2006   right, screws and plates   Sepsis due to Escherichia coli (E. coli) (HCC) 02/22/2021   SVT (supraventricular tachycardia) 2012   a. Holter in 2012 showed 27 episodes of SVT/atrial tach w/ longest being 36 beats; b. asymptomatic   Syncope 08/2015   a. short rhythm strip in [facility] ED showed junctional escape rhythm and AV dissociation with essentially sinus bradycardia   Ulnar fracture 1995   fall off of ladder   Past Surgical History:  Procedure Laterality Date   ERCP Left 02/22/2021   Procedure: ENDOSCOPIC RETROGRADE CHOLANGIOPANCREATOGRAPHY (ERCP);  Surgeon: Rosalie Kitchens, MD;  Location: [facility] ENDOSCOPY;  Service: Endoscopy;  Laterality: Left;   HERNIA REPAIR  Aug 2012   right indirect inguinal, with mesh   PANCREATIC STENT PLACEMENT  02/22/2021   Procedure: PANCREATIC STENT PLACEMENT;  Surgeon: Rosalie Kitchens, MD;  Location: [facility] ENDOSCOPY;  Service: Endoscopy;;   REMOVAL OF STONES  02/22/2021   Procedure: REMOVAL OF STONES;  Surgeon: Rosalie Kitchens, MD;  Location: [facility] ENDOSCOPY;  Service: Endoscopy;;   SPHINCTEROTOMY  02/22/2021   Procedure: ANNETT;  Surgeon:  Rosalie Kitchens, MD;  Location: [facility] ENDOSCOPY;  Service: Endoscopy;;    No Known Allergies  Outpatient Encounter Medications as of 08/21/2024  Medication Sig   acetaminophen  (TYLENOL ) 325 MG tablet Take 650 mg by mouth every 4 (four) hours as needed.   carboxymethylcellulose (ARTIFICIAL TEARS) 1 % ophthalmic solution Apply 1 drop to eye every 6 (six) hours as needed (dry eyes). Or in the  affected eye four times a day as needed   metoprolol  tartrate (LOPRESSOR ) 25 MG tablet Take 0.5 tablets (12.5 mg total) by mouth 2 (two) times daily.   Multiple Vitamins-Minerals (MENS 50+ MULTI VITAMIN/MIN PO) Take 1 tablet by mouth daily.   polyethylene glycol (MIRALAX / GLYCOLAX) 17 g packet Take 17 g by mouth daily as needed.   Sodium Fluoride (PREVIDENT 5000 BOOSTER PLUS) 1.1 % PSTE Place 1 Application onto teeth daily.   vitamin B-12 (CYANOCOBALAMIN ) 1000 MCG tablet Take 1,000 mcg by mouth daily.   No facility-administered encounter medications on file as of 08/21/2024.    Review of Systems  Unable toto obtain due to dementia.    Immunization History  Administered Date(s) Administered   INFLUENZA, HIGH DOSE SEASONAL PF 08/25/2022   Influenza, Seasonal, Injecte, Preservative Fre 09/08/2015, 09/03/2016   Influenza-Unspecified 09/12/2013, 08/18/2014, 09/01/2023   Moderna Covid-19 Fall Seasonal Vaccine 88yrs & older 02/16/2023   Moderna Covid-19 Vaccine Bivalent Booster 37yrs & up 08/01/2021, 04/07/2022   Moderna SARS-COV2 Booster Vaccination 09/20/2020, 03/28/2021   Moderna Sars-Covid-2 Vaccination 11/20/2019, 12/18/2019, 08/06/2023   PPD Test 04/29/2016   Pneumococcal Conjugate-13 01/23/2015   Pneumococcal Polysaccharide-23 01/20/2012, 06/22/2013   Tdap 08/21/2013   Unspecified SARS-COV-2 Vaccination 02/18/2024   Zoster Recombinant(Shingrix) 08/02/2006, 05/05/2023   Zoster, Live 08/21/2013   Pertinent  Health Maintenance Due  Topic Date Due   Influenza Vaccine  06/09/2024   HEMOGLOBIN A1C  09/09/2024   OPHTHALMOLOGY EXAM  03/14/2025   FOOT EXAM  06/29/2025      02/27/2021    7:57 AM 02/27/2021    8:00 PM 02/28/2021    8:00 AM 05/04/2023    2:54 PM 06/29/2024    8:39 AM  Fall Risk  Falls in the past year?    0 0  Was there an injury with Fall?     1  Fall Risk Category Calculator     1  (RETIRED) Patient Fall Risk Level High fall risk  High fall risk  High fall risk      Fall risk Follow up     Falls evaluation completed     Data saved with a previous flowsheet row definition     Vitals:   08/21/24 1520  BP: (!) 154/73  Pulse: 74  Resp: 20  Temp: (!) 97.2 F (36.2 C)  SpO2: 98%  Weight: 156 lb 3.2 oz (70.9 kg)  Height: 5' 8 (1.727 m)   Body mass index is 23.75 kg/m.  Physical Exam Constitutional:      General: He is not in acute distress. HENT:     Head: Normocephalic and atraumatic.     Mouth/Throat:     Mouth: Mucous membranes are moist.  Eyes:     Conjunctiva/sclera: Conjunctivae normal.  Cardiovascular:     Rate and Rhythm: Normal rate and regular rhythm.     Pulses: Normal pulses.     Heart sounds: Normal heart sounds.  Pulmonary:     Effort: Pulmonary effort is normal.     Breath sounds: Normal breath sounds.  Abdominal:  General: Bowel sounds are normal.     Palpations: Abdomen is soft.  Musculoskeletal:        General: No swelling. Normal range of motion.     Cervical back: Normal range of motion.  Skin:    General: Skin is warm and dry.  Neurological:     Mental Status: He is alert.  Psychiatric:        Mood and Affect: Mood normal.        Behavior: Behavior normal.     Labs reviewed: Recent Labs    09/20/23 0000 03/09/24 0000  NA 138 141  K 4.1 3.9  CL 104 105  CO2 30* 28*  BUN 14 16  CREATININE 0.7 0.8  CALCIUM  8.8 8.8   Recent Labs    09/20/23 0000 03/09/24 0000  AST 18 16  ALT 12 12  ALKPHOS 74 84  ALBUMIN 3.8 3.6   Recent Labs    09/20/23 0000 03/09/24 0000  WBC 5.3 4.5  NEUTROABS 2,581.00 2,151.00  HGB 13.1* 11.9*  HCT 40* 36*  PLT 213 200   Lab Results  Component Value Date   TSH 0.359 02/23/2021   Lab Results  Component Value Date   HGBA1C 7.5 03/09/2024   Lab Results  Component Value Date   CHOL 154 02/24/2021   HDL 44 02/24/2021   LDLCALC 92 02/24/2021   LDLDIRECT 124.0 01/04/2017   TRIG 91 02/24/2021   CHOLHDL 3.5 02/24/2021    Significant Diagnostic Results  in last 30 days:  No results found.  Assessment/Plan  1. Type 2 diabetes mellitus with other specified complication, without long-term current use of insulin  (HCC) (Primary) Lab Results  Component Value Date   HGBA1C 7.5 03/09/2024    - Diet controlled     2. SVT (supraventricular tachycardia) -   Rate controlled -   Continue metoprolol  to tartrate 12.5 mg twice a day  3. Vascular dementia without behavioral disturbance (HCC) -   BIMS score 7/15, ranging as severe cognitive impairment -   Continue supportive care -   Fall precautions  -   Continue to attend the [employer] staff Communication: Discussed plan of care with charge nurse.  Labs/tests ordered: None    Aaran Enberg Medina-Vargas, DNP, MSN, FNP-BC [facility] [phone] (Monday-Friday 8:00 a.m. - 5:00 p.m.) [phone] (after hours)

## 2024-09-18 LAB — HEPATIC FUNCTION PANEL
ALT: 10 U/L (ref 10–40)
AST: 16 (ref 14–40)
Alkaline Phosphatase: 77 (ref 25–125)
Bilirubin, Total: 0.8

## 2024-09-18 LAB — CBC AND DIFFERENTIAL
HCT: 36 — AB (ref 41–53)
Hemoglobin: 11.4 — AB (ref 13.5–17.5)
Neutrophils Absolute: 2525
Platelets: 203 K/uL (ref 150–400)
WBC: 5.1

## 2024-09-18 LAB — BASIC METABOLIC PANEL WITH GFR
BUN: 14 (ref 4–21)
CO2: 29 — AB (ref 13–22)
Chloride: 106 (ref 99–108)
Creatinine: 0.8 (ref 0.6–1.3)
Glucose: 106
Potassium: 3.9 meq/L (ref 3.5–5.1)
Sodium: 140 (ref 137–147)

## 2024-09-18 LAB — COMPREHENSIVE METABOLIC PANEL WITH GFR
Albumin: 3.5 (ref 3.5–5.0)
Calcium: 8.6 — AB (ref 8.7–10.7)
Globulin: 2
eGFR: 83

## 2024-09-18 LAB — CBC: RBC: 4 (ref 3.87–5.11)

## 2024-09-18 LAB — HEMOGLOBIN A1C: Hemoglobin A1C: 7.2

## 2024-09-26 ENCOUNTER — Non-Acute Institutional Stay (SKILLED_NURSING_FACILITY): Payer: Self-pay | Admitting: Nurse Practitioner

## 2024-09-26 ENCOUNTER — Encounter: Payer: Self-pay | Admitting: Nurse Practitioner

## 2024-09-26 DIAGNOSIS — I471 Supraventricular tachycardia, unspecified: Secondary | ICD-10-CM

## 2024-09-26 DIAGNOSIS — E1169 Type 2 diabetes mellitus with other specified complication: Secondary | ICD-10-CM | POA: Diagnosis not present

## 2024-09-26 DIAGNOSIS — K5900 Constipation, unspecified: Secondary | ICD-10-CM | POA: Diagnosis not present

## 2024-09-26 DIAGNOSIS — F0150 Vascular dementia without behavioral disturbance: Secondary | ICD-10-CM | POA: Diagnosis not present

## 2024-09-26 DIAGNOSIS — D649 Anemia, unspecified: Secondary | ICD-10-CM

## 2024-09-26 NOTE — Assessment & Plan Note (Signed)
 Controlled, has miralax PRN

## 2024-09-26 NOTE — Assessment & Plan Note (Signed)
 Stable, continue to monitor hgb.  Continues on Vit B12 supplement daily

## 2024-09-26 NOTE — Progress Notes (Signed)
 Location:  Other [facility]) Nursing Home Room Number: [phone] [id number] Place of Service:  SNF (31)  Dinara Lupu, Harlene POUR, NP  Patient Care Team: Caro Harlene POUR, NP as PCP - General (Geriatric Medicine) Perla Evalene PARAS, MD as Consulting Physician (Cardiology) Marylynn Verneita CROME, MD (Internal Medicine)  Extended Emergency Contact Information Primary Emergency Contact: Grable,Janet Address: [address]          [place], [place] [address] [place] Home Phone: [phone] Relation: Spouse Secondary Emergency Contact: Geradine Lerner Mobile Phone: [phone] Relation: Nephew  Goals of care: Advanced Directive information    08/21/2024    3:14 PM  Advanced Directives  Does Patient Have a Medical Advance Directive? Yes  Type of Estate Agent of [place];Living will;Out of facility DNR (pink MOST or yellow form)  Does patient want to make changes to medical advance directive? No - Patient declined  Copy of Healthcare Power of Attorney in Chart? Yes - validated most recent copy scanned in chart (See row information)  Pre-existing out of facility DNR order (yellow form or pink MOST form) Yellow form placed in chart (order not valid for inpatient use)     Chief Complaint  Patient presents with   Medical Management of Chronic Issues    Routine visit. Discuss need for td/tdap     HPI:  Pt is a 89 y.o. male seen today for medical management of chronic disease. Pt with hx of dementia, SVT, constipation, DM.  [facility] he enjoys He has no complaints at this time Staff reports he has been doing well and does not have concerns at this time.     Past Medical History:  Diagnosis Date   Acute gallstone pancreatitis 02/22/2021   Ascending cholangitis (HCC) 02/22/2021   Atrial fibrillation (HCC)    Choledocholithiasis with acute cholecystitis with obstruction 02/22/2021   Diabetes mellitus  without complication (HCC)    E coli bacteremia 02/22/2021   Fatigue    HLD (hyperlipidemia)    Hypertension    OSA (obstructive sleep apnea)    Radial fracture 2006   right, screws and plates   Sepsis due to Escherichia coli (E. coli) (HCC) 02/22/2021   SVT (supraventricular tachycardia) 2012   a. Holter in 2012 showed 27 episodes of SVT/atrial tach w/ longest being 36 beats; b. asymptomatic   Syncope 08/2015   a. short rhythm strip in [facility] ED showed junctional escape rhythm and AV dissociation with essentially sinus bradycardia   Ulnar fracture 1995   fall off of ladder   Past Surgical History:  Procedure Laterality Date   ERCP Left 02/22/2021   Procedure: ENDOSCOPIC RETROGRADE CHOLANGIOPANCREATOGRAPHY (ERCP);  Surgeon: Rosalie Kitchens, MD;  Location: [facility] ENDOSCOPY;  Service: Endoscopy;  Laterality: Left;   HERNIA REPAIR  Aug 2012   right indirect inguinal, with mesh   PANCREATIC STENT PLACEMENT  02/22/2021   Procedure: PANCREATIC STENT PLACEMENT;  Surgeon: Rosalie Kitchens, MD;  Location: [facility] ENDOSCOPY;  Service: Endoscopy;;   REMOVAL OF STONES  02/22/2021   Procedure: REMOVAL OF STONES;  Surgeon: Rosalie Kitchens, MD;  Location: [facility] ENDOSCOPY;  Service: Endoscopy;;   SPHINCTEROTOMY  02/22/2021   Procedure: ANNETT;  Surgeon: Rosalie Kitchens, MD;  Location: [facility] ENDOSCOPY;  Service: Endoscopy;;    No Known Allergies  Outpatient Encounter Medications as of 09/26/2024  Medication Sig   acetaminophen  (TYLENOL ) 325 MG tablet Take 650 mg by mouth every 4 (four) hours as  needed.   carboxymethylcellulose (ARTIFICIAL TEARS) 1 % ophthalmic solution Apply 1 drop to eye every 6 (six) hours as needed (dry eyes). Or in the affected eye four times a day as needed   metoprolol  tartrate (LOPRESSOR ) 25 MG tablet Take 0.5 tablets (12.5 mg total) by mouth 2 (two) times daily.   Multiple Vitamins-Minerals (MENS 50+ MULTI VITAMIN/MIN PO) Take 1 tablet by mouth daily.   polyethylene glycol (MIRALAX / GLYCOLAX) 17 g  packet Take 17 g by mouth daily as needed.   Sodium Fluoride (PREVIDENT 5000 BOOSTER PLUS) 1.1 % PSTE Place 1 Application onto teeth daily.   vitamin B-12 (CYANOCOBALAMIN ) 1000 MCG tablet Take 1,000 mcg by mouth daily.   No facility-administered encounter medications on file as of 09/26/2024.    Review of Systems  Unable to perform ROS: Dementia     Immunization History  Administered Date(s) Administered   INFLUENZA, HIGH DOSE SEASONAL PF 08/25/2022, 08/22/2024   Influenza, Seasonal, Injecte, Preservative Fre 09/08/2015, 09/03/2016   Influenza-Unspecified 09/12/2013, 08/18/2014, 09/01/2023   Moderna Covid-19 Fall Seasonal Vaccine 48yrs & older 02/16/2023, 09/01/2024   Moderna Covid-19 Vaccine Bivalent Booster 66yrs & up 08/01/2021, 04/07/2022   Moderna SARS-COV2 Booster Vaccination 09/20/2020, 03/28/2021   Moderna Sars-Covid-2 Vaccination 11/20/2019, 12/18/2019, 08/06/2023   PPD Test 04/29/2016   Pneumococcal Conjugate-13 01/23/2015   Pneumococcal Polysaccharide-23 01/20/2012, 06/22/2013   Tdap 08/21/2013   Unspecified SARS-COV-2 Vaccination 02/18/2024   Zoster Recombinant(Shingrix) 08/02/2006, 05/05/2023   Zoster, Live 08/21/2013   Pertinent  Health Maintenance Due  Topic Date Due   OPHTHALMOLOGY EXAM  03/14/2025   HEMOGLOBIN A1C  03/18/2025   FOOT EXAM  06/29/2025   Influenza Vaccine  Completed      02/27/2021    7:57 AM 02/27/2021    8:00 PM 02/28/2021    8:00 AM 05/04/2023    2:54 PM 06/29/2024    8:39 AM  Fall Risk  Falls in the past year?    0 0  Was there an injury with Fall?     1  Fall Risk Category Calculator     1  (RETIRED) Patient Fall Risk Level High fall risk  High fall risk  High fall risk     Fall risk Follow up     Falls evaluation completed     Data saved with a previous flowsheet row definition   Functional Status Survey:    Vitals:   09/26/24 0855 09/26/24 0856  BP: (S) (!) 145/71 (!) 151/82  Pulse: 82   Temp: (!) 97.1 F (36.2 C)   SpO2:  96%   Weight: 156 lb (70.8 kg)   Height: 5' 8 (1.727 m)    Body mass index is 23.72 kg/m. Physical Exam Constitutional:      General: He is not in acute distress.    Appearance: He is well-developed. He is not diaphoretic.  HENT:     Head: Normocephalic and atraumatic.     Right Ear: External ear normal.     Left Ear: External ear normal.     Mouth/Throat:     Pharynx: No oropharyngeal exudate.  Eyes:     Conjunctiva/sclera: Conjunctivae normal.     Pupils: Pupils are equal, round, and reactive to light.  Cardiovascular:     Rate and Rhythm: Normal rate and regular rhythm.     Heart sounds: Normal heart sounds.  Pulmonary:     Effort: Pulmonary effort is normal.     Breath sounds: Normal breath sounds.  Abdominal:  General: Bowel sounds are normal.     Palpations: Abdomen is soft.  Musculoskeletal:        General: No tenderness.     Cervical back: Normal range of motion and neck supple.     Right lower leg: No edema.     Left lower leg: No edema.  Skin:    General: Skin is warm and dry.  Neurological:     Mental Status: He is alert. Mental status is at baseline. He is disoriented.     Gait: Gait abnormal (ambulates with walker).     Labs reviewed: Recent Labs    03/09/24 0000 09/18/24 0000  NA 141 140  K 3.9 3.9  CL 105 106  CO2 28* 29*  BUN 16 14  CREATININE 0.8 0.8  CALCIUM  8.8 8.6*   Recent Labs    03/09/24 0000 09/18/24 0000  AST 16 16  ALT 12 10  ALKPHOS 84 77  ALBUMIN 3.6 3.5   Recent Labs    03/09/24 0000 09/18/24 0000  WBC 4.5 5.1  NEUTROABS 2,151.00 2,525.00  HGB 11.9* 11.4*  HCT 36* 36*  PLT 200 203   Lab Results  Component Value Date   TSH 0.359 02/23/2021   Lab Results  Component Value Date   HGBA1C 7.2 09/18/2024   Lab Results  Component Value Date   CHOL 154 02/24/2021   HDL 44 02/24/2021   LDLCALC 92 02/24/2021   LDLDIRECT 124.0 01/04/2017   TRIG 91 02/24/2021   CHOLHDL 3.5 02/24/2021    Significant  Diagnostic Results in last 30 days:  No results found.  Assessment/Plan Vascular dementia without behavioral disturbance (HCC) Slow progressive decline, continues with his routine. Continue with supportive care by staff.   SVT (supraventricular tachycardia) Rate controlled on metoprolol .   Diabetes mellitus (HCC) A1c of 7.2 this month, goal <7.5 Diet controlled.  Will monitor.   Constipation Controlled, has miralax PRN  Anemia Stable, continue to monitor hgb.  Continues on Vit B12 supplement daily      Tracy Kinner K. Caro BODILY [facility] [phone]

## 2024-09-26 NOTE — Assessment & Plan Note (Signed)
 A1c of 7.2 this month, goal <7.5 Diet controlled.  Will monitor.

## 2024-09-26 NOTE — Assessment & Plan Note (Signed)
Rate controlled on metoprolol.  °

## 2024-09-26 NOTE — Assessment & Plan Note (Signed)
 Slow progressive decline, continues with his routine. Continue with supportive care by staff.

## 2024-10-31 ENCOUNTER — Encounter: Payer: Self-pay | Admitting: Internal Medicine

## 2024-10-31 ENCOUNTER — Non-Acute Institutional Stay (SKILLED_NURSING_FACILITY): Payer: Self-pay | Admitting: Internal Medicine

## 2024-10-31 DIAGNOSIS — E785 Hyperlipidemia, unspecified: Secondary | ICD-10-CM | POA: Diagnosis not present

## 2024-10-31 DIAGNOSIS — I10 Essential (primary) hypertension: Secondary | ICD-10-CM

## 2024-10-31 DIAGNOSIS — E119 Type 2 diabetes mellitus without complications: Secondary | ICD-10-CM

## 2024-10-31 DIAGNOSIS — F0150 Vascular dementia without behavioral disturbance: Secondary | ICD-10-CM

## 2024-10-31 DIAGNOSIS — I471 Supraventricular tachycardia, unspecified: Secondary | ICD-10-CM

## 2024-10-31 NOTE — Assessment & Plan Note (Signed)
 Stable.  He is not on any statins.

## 2024-10-31 NOTE — Assessment & Plan Note (Signed)
 Continue with Lopressor  12.5 mg twice daily

## 2024-10-31 NOTE — Assessment & Plan Note (Signed)
 Rate is controlled with Lopressor .

## 2024-10-31 NOTE — Assessment & Plan Note (Signed)
 Stable.  Diet controlled.  Last A1c was 7.2%.  Given his age of 34, will not start any hypoglycemic agents. HgBA1c and mean plasma glucose: Lab Results  Component Value Date/Time   HGBA1C 7.2 09/18/2024 12:00 AM   MPG 146 02/24/2021 12:44 AM

## 2024-10-31 NOTE — Assessment & Plan Note (Signed)
 Patient continues in [facility]  in [facility] care.  He is not on any dementia meds.

## 2024-10-31 NOTE — Progress Notes (Signed)
 [facility] SN[facility] Routine Visit Progress Note    Location:  Other [place].  Nursing Home Room Number: [facility] [id number] Place of Service:  SNF [phone]) Camellia Door, DO   PCP: Caro Harlene POUR, NP   Patient Care Team: Caro Harlene POUR, NP as PCP - General (Geriatric Medicine) Perla Evalene PARAS, MD as Consulting Physician (Cardiology) Marylynn Verneita CROME, MD (Internal Medicine)   Extended Emergency Contact Information Primary Emergency Contact: Latino,Janet Address: [address]          [place], [place] [address] [place] Home Phone: [phone] Relation: Spouse Secondary Emergency Contact: Geradine Lerner Mobile Phone: [phone] Relation: Nephew   Goals of care: Advanced Directive information    10/31/2024    9:13 AM  Advanced Directives  Does Patient Have a Medical Advance Directive? Yes  Type of Estate Agent of [place];Living will;Out of facility DNR (pink MOST or yellow form)  Does patient want to make changes to medical advance directive? No - Patient declined  Copy of Healthcare Power of Attorney in Chart? Yes - validated most recent copy scanned in chart (See row information)    CODE STATUS: Do Not Resuscitate (DNR)   Chief Complaint  Patient presents with   Medical Management of Chronic Issues    Medical Management of Chronic Issues.      HPI: Pt is a 89 y.o. male seen today for medical management of chronic disease.   Hector Lucero is a 89 year old male with a history of type 2 diabetes, hyperlipidemia, vascular dementia without behavioral disturbances, history of SVT, who is seen for routine medical care.  [facility] over at [place] during the day.  He retired from [employer] after serving for 20 years.  He used to live in [place].  Used to be in [employer].  Used to play baseball.  Patient does not have any health concerns.  He uses a walker while ambulating.   Past  Medical History:  Diagnosis Date   Acute gallstone pancreatitis 02/22/2021   Ascending cholangitis (HCC) 02/22/2021   Atrial fibrillation (HCC)    Choledocholithiasis with acute cholecystitis with obstruction 02/22/2021   Diabetes mellitus without complication (HCC)    E coli bacteremia 02/22/2021   Fatigue    HLD (hyperlipidemia)    Hypertension    OSA (obstructive sleep apnea)    Radial fracture 2006   right, screws and plates   Sepsis due to Escherichia coli (E. coli) (HCC) 02/22/2021   SVT (supraventricular tachycardia) 2012   a. Holter in 2012 showed 27 episodes of SVT/atrial tach w/ longest being 36 beats; b. asymptomatic   Syncope 08/2015   a. short rhythm strip in [facility] ED showed junctional escape rhythm and AV dissociation with essentially sinus bradycardia   Ulnar fracture 1995   fall off of ladder   Past Surgical History:  Procedure Laterality Date   ERCP Left 02/22/2021   Procedure: ENDOSCOPIC RETROGRADE CHOLANGIOPANCREATOGRAPHY (ERCP);  Surgeon: Rosalie Kitchens, MD;  Location: [facility] ENDOSCOPY;  Service: Endoscopy;  Laterality: Left;   HERNIA REPAIR  Aug 2012   right indirect inguinal, with mesh   PANCREATIC STENT PLACEMENT  02/22/2021   Procedure: PANCREATIC STENT PLACEMENT;  Surgeon: Rosalie Kitchens, MD;  Location: [facility] ENDOSCOPY;  Service: Endoscopy;;   REMOVAL OF STONES  02/22/2021   Procedure: REMOVAL OF STONES;  Surgeon: Rosalie Kitchens, MD;  Location: [facility] ENDOSCOPY;  Service: Endoscopy;;   SPHINCTEROTOMY  02/22/2021   Procedure:  SPHINCTEROTOMY;  Surgeon: Rosalie Kitchens, MD;  Location: [facility] ENDOSCOPY;  Service: Endoscopy;;     Allergies[1]   Outpatient Encounter Medications as of 10/31/2024  Medication Sig   acetaminophen  (TYLENOL ) 325 MG tablet Take 650 mg by mouth every 4 (four) hours as needed.   carboxymethylcellulose (ARTIFICIAL TEARS) 1 % ophthalmic solution Apply 1 drop to eye every 6 (six) hours as needed (dry eyes). Or in the affected eye four times a day as needed    metoprolol  tartrate (LOPRESSOR ) 25 MG tablet Take 0.5 tablets (12.5 mg total) by mouth 2 (two) times daily.   Multiple Vitamins-Minerals (MENS 50+ MULTI VITAMIN/MIN PO) Take 1 tablet by mouth daily.   polyethylene glycol (MIRALAX / GLYCOLAX) 17 g packet Take 17 g by mouth daily as needed.   Sodium Fluoride (PREVIDENT 5000 BOOSTER PLUS) 1.1 % PSTE Place 1 Application onto teeth daily.   vitamin B-12 (CYANOCOBALAMIN ) 1000 MCG tablet Take 1,000 mcg by mouth daily.   No facility-administered encounter medications on file as of 10/31/2024.     Review of Systems  Unable to perform ROS: Dementia  All other systems reviewed and are negative.     Immunization History  Administered Date(s) Administered   INFLUENZA, HIGH DOSE SEASONAL PF 08/25/2022, 08/22/2024   Influenza, Seasonal, Injecte, Preservative Fre 09/08/2015, 09/03/2016   Influenza-Unspecified 09/12/2013, 08/18/2014, 09/01/2023   Moderna Covid-19 Fall Seasonal Vaccine 51yrs & older 02/16/2023, 09/01/2024   Moderna Covid-19 Vaccine Bivalent Booster 30yrs & up 08/01/2021, 04/07/2022   Moderna SARS-COV2 Booster Vaccination 09/20/2020, 03/28/2021   Moderna Sars-Covid-2 Vaccination 11/20/2019, 12/18/2019, 08/06/2023   PPD Test 04/29/2016   Pneumococcal Conjugate-13 01/23/2015   Pneumococcal Polysaccharide-23 01/20/2012, 06/22/2013   Tdap 08/21/2013   Unspecified SARS-COV-2 Vaccination 02/18/2024   Zoster Recombinant(Shingrix) 08/02/2006, 05/05/2023   Zoster, Live 08/21/2013   Pertinent  Health Maintenance Due  Topic Date Due   OPHTHALMOLOGY EXAM  03/14/2025   HEMOGLOBIN A1C  03/18/2025   FOOT EXAM  06/29/2025   Influenza Vaccine  Completed      02/27/2021    7:57 AM 02/27/2021    8:00 PM 02/28/2021    8:00 AM 05/04/2023    2:54 PM 06/29/2024    8:39 AM  Fall Risk  Falls in the past year?    0 0  Was there an injury with Fall?     1   Fall Risk Category Calculator     1  (RETIRED) Patient Fall Risk Level High fall risk  High  fall risk  High fall risk     Fall risk Follow up     Falls evaluation completed     Data saved with a previous flowsheet row definition   Functional Status Survey:     Vitals:   10/31/24 0909  BP: (!) 144/63  Pulse: 77  Resp: 18  Temp: (!) 96.8 F (36 C)  SpO2: 97%  Weight: 156 lb 6.4 oz (70.9 kg)  Height: 5' 8 (1.727 m)   Body mass index is 23.78 kg/m. Physical Exam Vitals and nursing note reviewed.  Constitutional:      General: He is not in acute distress.    Appearance: He is not toxic-appearing or diaphoretic.  HENT:     Head: Normocephalic and atraumatic.     Nose: Nose normal.  Eyes:     General: No scleral icterus. Cardiovascular:     Rate and Rhythm: Normal rate and regular rhythm.  Pulmonary:     Effort: Pulmonary effort is normal.  Breath sounds: Normal breath sounds.  Abdominal:     General: Bowel sounds are normal. There is no distension.     Palpations: Abdomen is soft.     Tenderness: There is no abdominal tenderness.  Musculoskeletal:     Right lower leg: No edema.     Left lower leg: No edema.  Skin:    General: Skin is warm and dry.     Capillary Refill: Capillary refill takes less than 2 seconds.  Neurological:     Mental Status: He is alert.     Comments: He is oriented person.  He knows it is Christmas time.  Does not know the year.      Labs reviewed: Recent Labs    03/09/24 0000 09/18/24 0000  NA 141 140  K 3.9 3.9  CL 105 106  CO2 28* 29*  BUN 16 14  CREATININE 0.8 0.8  CALCIUM  8.8 8.6*   Recent Labs    03/09/24 0000 09/18/24 0000  AST 16 16  ALT 12 10  ALKPHOS 84 77  ALBUMIN 3.6 3.5   Recent Labs    03/09/24 0000 09/18/24 0000  WBC 4.5 5.1  NEUTROABS 2,151.00 2,525.00  HGB 11.9* 11.4*  HCT 36* 36*  PLT 200 203   Lab Results  Component Value Date   TSH 0.359 02/23/2021   Lab Results  Component Value Date   HGBA1C 7.2 09/18/2024   Lab Results  Component Value Date   CHOL 154 02/24/2021   HDL 44  02/24/2021   LDLCALC 92 02/24/2021   LDLDIRECT 124.0 01/04/2017   TRIG 91 02/24/2021   CHOLHDL 3.5 02/24/2021      Assessment/Plan Vascular dementia without behavioral disturbance (HCC) Patient continues in the [facility]  in [facility] care.  He is not on any dementia meds.  Essential hypertension Continue with Lopressor  12.5 mg twice daily  Diet-controlled type 2 diabetes mellitus (HCC) Stable.  Diet controlled.  Last A1c was 7.2%.  Given his age of 11, will not start any hypoglycemic agents. HgBA1c and mean plasma glucose: Lab Results  Component Value Date/Time   HGBA1C 7.2 09/18/2024 12:00 AM   MPG 146 02/24/2021 12:44 AM     Hyperlipidemia with target LDL less than 70 Stable.  He is not on any statins.  SVT (supraventricular tachycardia) Rate is controlled with Lopressor .    Camellia Door, DO  [facility] [phone]      [1] No Known Allergies

## 2024-11-28 ENCOUNTER — Non-Acute Institutional Stay (SKILLED_NURSING_FACILITY): Payer: Self-pay | Admitting: Nurse Practitioner

## 2024-11-28 ENCOUNTER — Encounter: Payer: Self-pay | Admitting: Nurse Practitioner

## 2024-11-28 DIAGNOSIS — I10 Essential (primary) hypertension: Secondary | ICD-10-CM

## 2024-11-28 DIAGNOSIS — E119 Type 2 diabetes mellitus without complications: Secondary | ICD-10-CM

## 2024-11-28 DIAGNOSIS — K5900 Constipation, unspecified: Secondary | ICD-10-CM

## 2024-11-28 DIAGNOSIS — I471 Supraventricular tachycardia, unspecified: Secondary | ICD-10-CM | POA: Diagnosis not present

## 2024-11-28 DIAGNOSIS — F0150 Vascular dementia without behavioral disturbance: Secondary | ICD-10-CM

## 2024-11-28 NOTE — Assessment & Plan Note (Signed)
 Blood pressure controlled Continue with Lopressor  12.5 mg twice daily

## 2024-11-28 NOTE — Assessment & Plan Note (Signed)
 Rate is controlled with Lopressor .

## 2024-11-28 NOTE — Assessment & Plan Note (Signed)
 Controlled, has miralax PRN

## 2024-11-28 NOTE — Assessment & Plan Note (Signed)
 A1c of 7.2, at goal

## 2024-11-28 NOTE — Progress Notes (Signed)
 " Location:  Other [facility] [place].  Nursing Home Room Number: [facility] [id number] Place of Service:  SNF (31)  Caro, Harlene POUR, NP  Patient Care Team: Caro Harlene POUR, NP as PCP - General (Geriatric Medicine) Perla Evalene PARAS, MD as Consulting Physician (Cardiology) Marylynn Verneita CROME, MD (Internal Medicine)  Extended Emergency Contact Information Primary Emergency Contact: Koziel,Janet Address: [address]          [place], [place] [address] [place] Home Phone: [phone] Relation: Spouse Secondary Emergency Contact: Geradine Lerner Mobile Phone: [phone] Relation: Nephew  Goals of care: Advanced Directive information    10/31/2024    9:13 AM  Advanced Directives  Does Patient Have a Medical Advance Directive? Yes  Type of Estate Agent of [place];Living will;Out of facility DNR (pink MOST or yellow form)  Does patient want to make changes to medical advance directive? No - Patient declined  Copy of Healthcare Power of Attorney in Chart? Yes - validated most recent copy scanned in chart (See row information)     Chief Complaint  Patient presents with   Medical Management of Chronic Issues    Medical Management of Chronic Issues.     HPI:  Pt is a 89 y.o. male seen today for medical management of chronic disease.   Pt with hx of dementia, hyperlipidemia, DM, SVT.   [facility].  He has been doing well in the past month without any acute issues per nursing. He keeps to his routine and does good.  He ambulates with walker. No pain noted    Past Medical History:  Diagnosis Date   Acute gallstone pancreatitis 02/22/2021   Ascending cholangitis (HCC) 02/22/2021   Atrial fibrillation (HCC)    Choledocholithiasis with acute cholecystitis with obstruction 02/22/2021   Diabetes mellitus without complication (HCC)    E coli bacteremia 02/22/2021   Fatigue    HLD (hyperlipidemia)     Hypertension    OSA (obstructive sleep apnea)    Radial fracture 2006   right, screws and plates   Sepsis due to Escherichia coli (E. coli) (HCC) 02/22/2021   SVT (supraventricular tachycardia) 2012   a. Holter in 2012 showed 27 episodes of SVT/atrial tach w/ longest being 36 beats; b. asymptomatic   Syncope 08/2015   a. short rhythm strip in [facility] ED showed junctional escape rhythm and AV dissociation with essentially sinus bradycardia   Ulnar fracture 1995   fall off of ladder   Past Surgical History:  Procedure Laterality Date   ERCP Left 02/22/2021   Procedure: ENDOSCOPIC RETROGRADE CHOLANGIOPANCREATOGRAPHY (ERCP);  Surgeon: Rosalie Kitchens, MD;  Location[facility] ENDOSCOPY;  Service: Endoscopy;  Laterality: Left;   HERNIA REPAIR  Aug 2012   right indirect inguinal, with mesh   PANCREATIC STENT PLACEMENT  02/22/2021   Procedure: PANCREATIC STENT PLACEMENT;  Surgeon: Rosalie Kitchens, MD;  Location: [facility] ENDOSCOPY;  Service: Endoscopy;;   REMOVAL OF STONES  02/22/2021   Procedure: REMOVAL OF STONES;  Surgeon: Rosalie Kitchens, MD;  Location: [facility] ENDOSCOPY;  Service: Endoscopy;;   SPHINCTEROTOMY  02/22/2021   Procedure: ANNETT;  Surgeon: Rosalie Kitchens, MD;  Location: [facility] ENDOSCOPY;  Service: Endoscopy;;    Allergies[1]  Outpatient Encounter Medications as of 11/28/2024  Medication Sig   acetaminophen  (TYLENOL ) 325 MG tablet Take 650 mg by mouth every 4 (four) hours as needed.   carboxymethylcellulose (ARTIFICIAL TEARS) 1 % ophthalmic solution Apply 1 drop to eye every  6 (six) hours as needed (dry eyes). Or in the affected eye four times a day as needed   metoprolol  tartrate (LOPRESSOR ) 25 MG tablet Take 0.5 tablets (12.5 mg total) by mouth 2 (two) times daily.   Multiple Vitamins-Minerals (MENS 50+ MULTI VITAMIN/MIN PO) Take 1 tablet by mouth daily.   polyethylene glycol (MIRALAX / GLYCOLAX) 17 g packet Take 17 g by mouth daily as needed.   Sodium Fluoride (PREVIDENT 5000 BOOSTER PLUS) 1.1 % PSTE  Place 1 Application onto teeth daily.   vitamin B-12 (CYANOCOBALAMIN ) 1000 MCG tablet Take 1,000 mcg by mouth daily.   No facility-administered encounter medications on file as of 11/28/2024.    Review of Systems  Constitutional:  Negative for activity change, appetite change, fatigue and unexpected weight change.  HENT:  Negative for congestion and hearing loss.   Eyes: Negative.   Respiratory:  Negative for cough and shortness of breath.   Cardiovascular:  Negative for chest pain, palpitations and leg swelling.  Gastrointestinal:  Negative for abdominal pain, constipation and diarrhea.  Genitourinary:  Negative for difficulty urinating and dysuria.  Musculoskeletal:  Negative for arthralgias and myalgias.  Skin:  Negative for color change and wound.  Neurological:  Negative for dizziness and weakness.  Psychiatric/Behavioral:  Positive for confusion. Negative for agitation and behavioral problems.      Immunization History  Administered Date(s) Administered   INFLUENZA, HIGH DOSE SEASONAL PF 08/25/2022, 08/22/2024   Influenza, Seasonal, Injecte, Preservative Fre 09/08/2015, 09/03/2016   Influenza-Unspecified 09/12/2013, 08/18/2014, 09/01/2023   Moderna Covid-19 Fall Seasonal Vaccine 67yrs & older 02/16/2023, 09/01/2024   Moderna Covid-19 Vaccine Bivalent Booster 72yrs & up 08/01/2021, 04/07/2022   Moderna SARS-COV2 Booster Vaccination 09/20/2020, 03/28/2021   Moderna Sars-Covid-2 Vaccination 11/20/2019, 12/18/2019, 08/06/2023   PPD Test 04/29/2016   Pneumococcal Conjugate-13 01/23/2015   Pneumococcal Polysaccharide-23 01/20/2012, 06/22/2013   Tdap 08/21/2013   Unspecified SARS-COV-2 Vaccination 02/18/2024   Zoster Recombinant(Shingrix) 08/02/2006, 05/05/2023   Zoster, Live 08/21/2013   Pertinent  Health Maintenance Due  Topic Date Due   OPHTHALMOLOGY EXAM  03/14/2025   HEMOGLOBIN A1C  03/18/2025   FOOT EXAM  06/29/2025   Influenza Vaccine  Completed      02/27/2021     7:57 AM 02/27/2021    8:00 PM 02/28/2021    8:00 AM 05/04/2023    2:54 PM 06/29/2024    8:39 AM  Fall Risk  Falls in the past year?    0 0  Was there an injury with Fall?     1   Fall Risk Category Calculator     1  (RETIRED) Patient Fall Risk Level High fall risk  High fall risk  High fall risk     Fall risk Follow up     Falls evaluation completed     Data saved with a previous flowsheet row definition   Functional Status Survey:    Vitals:   11/28/24 1240  BP: 133/69  Pulse: 81  Resp: 20  Temp: 98.1 F (36.7 C)  SpO2: 96%  Weight: 154 lb (69.9 kg)  Height: 5' 8 (1.727 m)   Body mass index is 23.42 kg/m.  Wt Readings from Last 3 Encounters:  11/28/24 154 lb (69.9 kg)  10/31/24 156 lb 6.4 oz (70.9 kg)  09/26/24 156 lb (70.8 kg)    Physical Exam Constitutional:      General: He is not in acute distress.    Appearance: He is well-developed. He is not diaphoretic.  HENT:  Head: Normocephalic and atraumatic.     Right Ear: External ear normal.     Left Ear: External ear normal.     Mouth/Throat:     Pharynx: No oropharyngeal exudate.  Eyes:     Conjunctiva/sclera: Conjunctivae normal.     Pupils: Pupils are equal, round, and reactive to light.  Cardiovascular:     Rate and Rhythm: Normal rate and regular rhythm.     Heart sounds: Normal heart sounds.  Pulmonary:     Effort: Pulmonary effort is normal.     Breath sounds: Normal breath sounds.  Abdominal:     General: Bowel sounds are normal.     Palpations: Abdomen is soft.  Musculoskeletal:        General: No tenderness.     Cervical back: Normal range of motion and neck supple.     Right lower leg: No edema.     Left lower leg: No edema.  Skin:    General: Skin is warm and dry.  Neurological:     Mental Status: He is alert and oriented to person, place, and time.     Labs reviewed: Recent Labs    03/09/24 0000 09/18/24 0000  NA 141 140  K 3.9 3.9  CL 105 106  CO2 28* 29*  BUN 16 14   CREATININE 0.8 0.8  CALCIUM  8.8 8.6*   Recent Labs    03/09/24 0000 09/18/24 0000  AST 16 16  ALT 12 10  ALKPHOS 84 77  ALBUMIN 3.6 3.5   Recent Labs    03/09/24 0000 09/18/24 0000  WBC 4.5 5.1  NEUTROABS 2,151.00 2,525.00  HGB 11.9* 11.4*  HCT 36* 36*  PLT 200 203   Lab Results  Component Value Date   TSH 0.359 02/23/2021   Lab Results  Component Value Date   HGBA1C 7.2 09/18/2024   Lab Results  Component Value Date   CHOL 154 02/24/2021   HDL 44 02/24/2021   LDLCALC 92 02/24/2021   LDLDIRECT 124.0 01/04/2017   TRIG 91 02/24/2021   CHOLHDL 3.5 02/24/2021    Significant Diagnostic Results in last 30 days:  No results found.  Assessment/Plan Vascular dementia without behavioral disturbance (HCC) -Stable, no acute changes in cognitive or functional status, continue supportive care.    SVT (supraventricular tachycardia) Rate is controlled with Lopressor .  Essential hypertension Blood pressure controlled Continue with Lopressor  12.5 mg twice daily  Diet-controlled type 2 diabetes mellitus (HCC) A1c of 7.2, at goal    Constipation Controlled, has miralax PRN     Igor Bishop K. Caro BODILY [facility] [phone]       [1] No Known Allergies  "

## 2024-11-28 NOTE — Assessment & Plan Note (Signed)
 Stable, no acute changes in cognitive or functional status, continue supportive care.
